# Patient Record
Sex: Male | Born: 1935
Health system: Southern US, Community
[De-identification: ages and names within clinical notes are randomized; demographics above are authoritative.]

## PROBLEM LIST (undated history)

## (undated) DIAGNOSIS — I10 Essential (primary) hypertension: Secondary | ICD-10-CM

## (undated) DIAGNOSIS — R7309 Other abnormal glucose: Secondary | ICD-10-CM

## (undated) DIAGNOSIS — Z87898 Personal history of other specified conditions: Secondary | ICD-10-CM

## (undated) DIAGNOSIS — K5730 Diverticulosis of large intestine without perforation or abscess without bleeding: Secondary | ICD-10-CM

## (undated) DIAGNOSIS — H9190 Unspecified hearing loss, unspecified ear: Secondary | ICD-10-CM

## (undated) DIAGNOSIS — K648 Other hemorrhoids: Secondary | ICD-10-CM

## (undated) DIAGNOSIS — K219 Gastro-esophageal reflux disease without esophagitis: Secondary | ICD-10-CM

## (undated) DIAGNOSIS — C679 Malignant neoplasm of bladder, unspecified: Secondary | ICD-10-CM

## (undated) DIAGNOSIS — M4800 Spinal stenosis, site unspecified: Secondary | ICD-10-CM

## (undated) DIAGNOSIS — R972 Elevated prostate specific antigen [PSA]: Secondary | ICD-10-CM

## (undated) DIAGNOSIS — D126 Benign neoplasm of colon, unspecified: Secondary | ICD-10-CM

## (undated) DIAGNOSIS — K4090 Unilateral inguinal hernia, without obstruction or gangrene, not specified as recurrent: Secondary | ICD-10-CM

## (undated) DIAGNOSIS — I739 Peripheral vascular disease, unspecified: Secondary | ICD-10-CM

## (undated) HISTORY — PX: LAPAROSCOPY: SHX197

## (undated) HISTORY — DX: Other hemorrhoids: K64.8

## (undated) HISTORY — DX: Elevated prostate specific antigen (PSA): R97.20

## (undated) HISTORY — DX: Peripheral vascular disease, unspecified: I73.9

## (undated) HISTORY — DX: Essential (primary) hypertension: I10

## (undated) HISTORY — PX: TONSILLECTOMY: SUR1361

## (undated) HISTORY — PX: PILONIDAL CYST / SINUS EXCISION: SUR543

## (undated) HISTORY — DX: Benign neoplasm of colon, unspecified: D12.6

## (undated) HISTORY — DX: Unspecified hearing loss, unspecified ear: H91.90

## (undated) HISTORY — DX: Diverticulosis of large intestine without perforation or abscess without bleeding: K57.30

## (undated) HISTORY — DX: Malignant neoplasm of bladder, unspecified: C67.9

## (undated) HISTORY — DX: Gastro-esophageal reflux disease without esophagitis: K21.9

## (undated) HISTORY — DX: Spinal stenosis, site unspecified: M48.00

## (undated) HISTORY — DX: Unilateral inguinal hernia, without obstruction or gangrene, not specified as recurrent: K40.90

## (undated) HISTORY — DX: Personal history of other specified conditions: Z87.898

## (undated) HISTORY — PX: CATARACT EXTRACTION: SUR2

## (undated) HISTORY — DX: Other abnormal glucose: R73.09

---

## 1998-09-23 ENCOUNTER — Encounter: Payer: Self-pay | Admitting: Urology

## 1998-09-23 ENCOUNTER — Ambulatory Visit (HOSPITAL_COMMUNITY): Admission: RE | Admit: 1998-09-23 | Discharge: 1998-09-23 | Payer: Self-pay | Admitting: Urology

## 1999-12-04 ENCOUNTER — Encounter: Payer: Self-pay | Admitting: Neurosurgery

## 1999-12-04 ENCOUNTER — Ambulatory Visit (HOSPITAL_COMMUNITY): Admission: RE | Admit: 1999-12-04 | Discharge: 1999-12-04 | Payer: Self-pay | Admitting: Neurosurgery

## 1999-12-18 ENCOUNTER — Ambulatory Visit (HOSPITAL_COMMUNITY): Admission: RE | Admit: 1999-12-18 | Discharge: 1999-12-18 | Payer: Self-pay | Admitting: Neurosurgery

## 1999-12-18 ENCOUNTER — Encounter: Payer: Self-pay | Admitting: Neurosurgery

## 2000-01-01 ENCOUNTER — Ambulatory Visit (HOSPITAL_COMMUNITY): Admission: RE | Admit: 2000-01-01 | Discharge: 2000-01-01 | Payer: Self-pay | Admitting: Neurosurgery

## 2000-01-01 ENCOUNTER — Encounter: Payer: Self-pay | Admitting: Neurosurgery

## 2000-02-19 ENCOUNTER — Encounter: Admission: RE | Admit: 2000-02-19 | Discharge: 2000-03-17 | Payer: Self-pay | Admitting: *Deleted

## 2000-04-01 ENCOUNTER — Encounter: Payer: Self-pay | Admitting: Neurosurgery

## 2000-04-01 ENCOUNTER — Encounter: Admission: RE | Admit: 2000-04-01 | Discharge: 2000-04-01 | Payer: Self-pay | Admitting: Neurosurgery

## 2001-01-18 DIAGNOSIS — Z87898 Personal history of other specified conditions: Secondary | ICD-10-CM

## 2001-01-18 HISTORY — DX: Personal history of other specified conditions: Z87.898

## 2001-05-25 ENCOUNTER — Encounter: Payer: Self-pay | Admitting: Pulmonary Disease

## 2001-05-25 ENCOUNTER — Ambulatory Visit (HOSPITAL_COMMUNITY): Admission: RE | Admit: 2001-05-25 | Discharge: 2001-05-25 | Payer: Self-pay | Admitting: Pulmonary Disease

## 2001-05-31 ENCOUNTER — Encounter (INDEPENDENT_AMBULATORY_CARE_PROVIDER_SITE_OTHER): Payer: Self-pay | Admitting: Specialist

## 2001-05-31 ENCOUNTER — Other Ambulatory Visit: Admission: RE | Admit: 2001-05-31 | Discharge: 2001-05-31 | Payer: Self-pay | Admitting: Oncology

## 2001-07-10 ENCOUNTER — Ambulatory Visit: Admission: RE | Admit: 2001-07-10 | Discharge: 2001-10-08 | Payer: Self-pay | Admitting: Radiation Oncology

## 2003-11-27 ENCOUNTER — Ambulatory Visit: Payer: Self-pay | Admitting: Pulmonary Disease

## 2004-01-03 ENCOUNTER — Ambulatory Visit: Payer: Self-pay | Admitting: Oncology

## 2004-04-10 ENCOUNTER — Ambulatory Visit: Payer: Self-pay | Admitting: Pulmonary Disease

## 2004-06-16 ENCOUNTER — Ambulatory Visit: Payer: Self-pay | Admitting: Pulmonary Disease

## 2004-06-19 ENCOUNTER — Ambulatory Visit: Payer: Self-pay | Admitting: Pulmonary Disease

## 2004-06-24 ENCOUNTER — Ambulatory Visit: Payer: Self-pay | Admitting: Pulmonary Disease

## 2004-07-02 ENCOUNTER — Ambulatory Visit: Payer: Self-pay | Admitting: Oncology

## 2005-01-01 ENCOUNTER — Ambulatory Visit: Payer: Self-pay | Admitting: Oncology

## 2005-01-22 ENCOUNTER — Ambulatory Visit: Payer: Self-pay | Admitting: Pulmonary Disease

## 2005-06-18 ENCOUNTER — Ambulatory Visit: Payer: Self-pay | Admitting: Pulmonary Disease

## 2005-06-23 ENCOUNTER — Ambulatory Visit: Payer: Self-pay | Admitting: Pulmonary Disease

## 2005-06-25 ENCOUNTER — Ambulatory Visit: Payer: Self-pay | Admitting: Pulmonary Disease

## 2005-06-30 ENCOUNTER — Ambulatory Visit: Payer: Self-pay | Admitting: Oncology

## 2005-07-02 LAB — CBC WITH DIFFERENTIAL/PLATELET
BASO%: 0.3 % (ref 0.0–2.0)
EOS%: 5.7 % (ref 0.0–7.0)
HCT: 40.7 % (ref 38.7–49.9)
LYMPH%: 20.9 % (ref 14.0–48.0)
MCH: 29.3 pg (ref 28.0–33.4)
MCHC: 34 g/dL (ref 32.0–35.9)
MONO%: 7.8 % (ref 0.0–13.0)
NEUT%: 65.3 % (ref 40.0–75.0)
Platelets: 205 10*3/uL (ref 145–400)
RBC: 4.73 10*6/uL (ref 4.20–5.71)

## 2005-07-05 LAB — COMPREHENSIVE METABOLIC PANEL
ALT: 25 U/L (ref 0–40)
AST: 29 U/L (ref 0–37)
Alkaline Phosphatase: 44 U/L (ref 39–117)
Creatinine, Ser: 1.25 mg/dL (ref 0.40–1.50)
Sodium: 134 mEq/L — ABNORMAL LOW (ref 135–145)
Total Bilirubin: 1 mg/dL (ref 0.3–1.2)
Total Protein: 6.8 g/dL (ref 6.0–8.3)

## 2005-07-05 LAB — BETA 2 MICROGLOBULIN, SERUM: Beta-2 Microglobulin: 2.37 mg/L — ABNORMAL HIGH (ref 1.01–1.73)

## 2005-12-29 ENCOUNTER — Ambulatory Visit: Payer: Self-pay | Admitting: Oncology

## 2005-12-31 LAB — CBC WITH DIFFERENTIAL/PLATELET
Basophils Absolute: 0 10*3/uL (ref 0.0–0.1)
Eosinophils Absolute: 0.5 10*3/uL (ref 0.0–0.5)
HCT: 40.9 % (ref 38.7–49.9)
HGB: 14 g/dL (ref 13.0–17.1)
LYMPH%: 16.8 % (ref 14.0–48.0)
MCH: 29.7 pg (ref 28.0–33.4)
MCV: 86.7 fL (ref 81.6–98.0)
MONO%: 7.2 % (ref 0.0–13.0)
NEUT#: 5.6 10*3/uL (ref 1.5–6.5)
NEUT%: 69.4 % (ref 40.0–75.0)
Platelets: 219 10*3/uL (ref 145–400)

## 2006-01-03 LAB — COMPREHENSIVE METABOLIC PANEL
Albumin: 4.4 g/dL (ref 3.5–5.2)
Alkaline Phosphatase: 50 U/L (ref 39–117)
BUN: 18 mg/dL (ref 6–23)
Creatinine, Ser: 1.15 mg/dL (ref 0.40–1.50)
Glucose, Bld: 92 mg/dL (ref 70–99)
Potassium: 3.8 mEq/L (ref 3.5–5.3)
Total Bilirubin: 1.1 mg/dL (ref 0.3–1.2)

## 2006-06-14 ENCOUNTER — Ambulatory Visit: Payer: Self-pay | Admitting: Pulmonary Disease

## 2006-06-14 LAB — CONVERTED CEMR LAB
ALT: 22 units/L (ref 0–40)
Basophils Relative: 0.5 % (ref 0.0–1.0)
Bilirubin, Direct: 0.1 mg/dL (ref 0.0–0.3)
CO2: 27 meq/L (ref 19–32)
Calcium: 9.2 mg/dL (ref 8.4–10.5)
Cholesterol: 185 mg/dL (ref 0–200)
Eosinophils Relative: 5.3 % — ABNORMAL HIGH (ref 0.0–5.0)
GFR calc Af Amer: 68 mL/min
Glucose, Bld: 123 mg/dL — ABNORMAL HIGH (ref 70–99)
HCT: 42.3 % (ref 39.0–52.0)
Hemoglobin: 14.2 g/dL (ref 13.0–17.0)
Leukocytes, UA: NEGATIVE
Lymphocytes Relative: 13.7 % (ref 12.0–46.0)
Monocytes Absolute: 0.7 10*3/uL (ref 0.2–0.7)
Neutro Abs: 7.1 10*3/uL (ref 1.4–7.7)
Neutrophils Relative %: 73.2 % (ref 43.0–77.0)
Nitrite: NEGATIVE
Specific Gravity, Urine: 1.01 (ref 1.000–1.03)
Total Protein, Urine: NEGATIVE mg/dL
Total Protein: 6.8 g/dL (ref 6.0–8.3)
WBC: 9.6 10*3/uL (ref 4.5–10.5)

## 2006-06-17 ENCOUNTER — Ambulatory Visit: Payer: Self-pay | Admitting: Pulmonary Disease

## 2006-07-05 ENCOUNTER — Ambulatory Visit: Payer: Self-pay | Admitting: Oncology

## 2006-07-08 LAB — CBC WITH DIFFERENTIAL/PLATELET
BASO%: 0.6 % (ref 0.0–2.0)
EOS%: 2.9 % (ref 0.0–7.0)
Eosinophils Absolute: 0.3 10*3/uL (ref 0.0–0.5)
MCV: 85.9 fL (ref 81.6–98.0)
MONO%: 5.9 % (ref 0.0–13.0)
NEUT#: 8.2 10*3/uL — ABNORMAL HIGH (ref 1.5–6.5)
RBC: 4.66 10*6/uL (ref 4.20–5.71)
RDW: 13.1 % (ref 11.2–14.6)

## 2006-07-08 LAB — COMPREHENSIVE METABOLIC PANEL
ALT: 18 U/L (ref 0–53)
AST: 24 U/L (ref 0–37)
Albumin: 4.2 g/dL (ref 3.5–5.2)
Alkaline Phosphatase: 52 U/L (ref 39–117)
Glucose, Bld: 81 mg/dL (ref 70–99)
Potassium: 3.8 mEq/L (ref 3.5–5.3)
Sodium: 138 mEq/L (ref 135–145)
Total Protein: 7.1 g/dL (ref 6.0–8.3)

## 2006-09-08 ENCOUNTER — Ambulatory Visit: Payer: Self-pay | Admitting: Oncology

## 2006-10-14 ENCOUNTER — Ambulatory Visit: Payer: Self-pay | Admitting: Gastroenterology

## 2006-11-09 ENCOUNTER — Ambulatory Visit: Payer: Self-pay | Admitting: Oncology

## 2006-11-18 ENCOUNTER — Encounter: Payer: Self-pay | Admitting: Gastroenterology

## 2006-11-18 ENCOUNTER — Ambulatory Visit: Payer: Self-pay | Admitting: Gastroenterology

## 2006-11-18 DIAGNOSIS — D126 Benign neoplasm of colon, unspecified: Secondary | ICD-10-CM

## 2006-11-18 DIAGNOSIS — K5730 Diverticulosis of large intestine without perforation or abscess without bleeding: Secondary | ICD-10-CM | POA: Insufficient documentation

## 2006-11-18 DIAGNOSIS — K648 Other hemorrhoids: Secondary | ICD-10-CM | POA: Insufficient documentation

## 2006-11-21 DIAGNOSIS — M545 Low back pain: Secondary | ICD-10-CM | POA: Insufficient documentation

## 2006-11-21 DIAGNOSIS — R7309 Other abnormal glucose: Secondary | ICD-10-CM | POA: Insufficient documentation

## 2006-11-21 DIAGNOSIS — K219 Gastro-esophageal reflux disease without esophagitis: Secondary | ICD-10-CM | POA: Insufficient documentation

## 2006-11-21 DIAGNOSIS — N400 Enlarged prostate without lower urinary tract symptoms: Secondary | ICD-10-CM | POA: Insufficient documentation

## 2006-11-21 DIAGNOSIS — I10 Essential (primary) hypertension: Secondary | ICD-10-CM | POA: Insufficient documentation

## 2007-01-04 ENCOUNTER — Ambulatory Visit: Payer: Self-pay | Admitting: Oncology

## 2007-01-06 ENCOUNTER — Encounter: Payer: Self-pay | Admitting: Pulmonary Disease

## 2007-01-06 LAB — CBC WITH DIFFERENTIAL/PLATELET
BASO%: 0.3 % (ref 0.0–2.0)
EOS%: 4.2 % (ref 0.0–7.0)
HCT: 38.8 % (ref 38.7–49.9)
LYMPH%: 18.4 % (ref 14.0–48.0)
MCH: 30 pg (ref 28.0–33.4)
MCHC: 34.7 g/dL (ref 32.0–35.9)
MCV: 86.6 fL (ref 81.6–98.0)
NEUT%: 68.9 % (ref 40.0–75.0)
Platelets: 215 10*3/uL (ref 145–400)

## 2007-01-09 LAB — COMPREHENSIVE METABOLIC PANEL
AST: 23 U/L (ref 0–37)
Albumin: 4.1 g/dL (ref 3.5–5.2)
Alkaline Phosphatase: 46 U/L (ref 39–117)
BUN: 12 mg/dL (ref 6–23)
Potassium: 3.9 mEq/L (ref 3.5–5.3)
Total Bilirubin: 1 mg/dL (ref 0.3–1.2)

## 2007-03-27 ENCOUNTER — Ambulatory Visit: Payer: Self-pay | Admitting: Pulmonary Disease

## 2007-03-31 ENCOUNTER — Encounter: Payer: Self-pay | Admitting: Pulmonary Disease

## 2007-04-03 DIAGNOSIS — M4800 Spinal stenosis, site unspecified: Secondary | ICD-10-CM

## 2007-04-03 DIAGNOSIS — Z87898 Personal history of other specified conditions: Secondary | ICD-10-CM

## 2007-07-05 ENCOUNTER — Ambulatory Visit: Payer: Self-pay | Admitting: Oncology

## 2007-07-07 ENCOUNTER — Encounter: Payer: Self-pay | Admitting: Pulmonary Disease

## 2007-07-07 LAB — CBC WITH DIFFERENTIAL/PLATELET
Basophils Absolute: 0 10*3/uL (ref 0.0–0.1)
Eosinophils Absolute: 0.6 10*3/uL — ABNORMAL HIGH (ref 0.0–0.5)
HGB: 13.7 g/dL (ref 13.0–17.1)
MONO#: 0.5 10*3/uL (ref 0.1–0.9)
NEUT#: 5.6 10*3/uL (ref 1.5–6.5)
RBC: 4.55 10*6/uL (ref 4.20–5.71)
RDW: 12.9 % (ref 11.2–14.6)
WBC: 8.2 10*3/uL (ref 4.0–10.0)
lymph#: 1.4 10*3/uL (ref 0.9–3.3)

## 2007-07-10 ENCOUNTER — Ambulatory Visit: Payer: Self-pay | Admitting: Pulmonary Disease

## 2007-07-10 LAB — COMPREHENSIVE METABOLIC PANEL
ALT: 17 U/L (ref 0–53)
AST: 24 U/L (ref 0–37)
Creatinine, Ser: 1.22 mg/dL (ref 0.40–1.50)
Sodium: 133 mEq/L — ABNORMAL LOW (ref 135–145)
Total Bilirubin: 0.9 mg/dL (ref 0.3–1.2)
Total Protein: 6.9 g/dL (ref 6.0–8.3)

## 2007-07-11 ENCOUNTER — Ambulatory Visit: Payer: Self-pay | Admitting: Pulmonary Disease

## 2007-07-11 LAB — CONVERTED CEMR LAB: OCCULT 1: NEGATIVE

## 2007-07-13 ENCOUNTER — Encounter: Payer: Self-pay | Admitting: Pulmonary Disease

## 2007-07-14 ENCOUNTER — Ambulatory Visit: Payer: Self-pay | Admitting: Pulmonary Disease

## 2007-07-14 DIAGNOSIS — C679 Malignant neoplasm of bladder, unspecified: Secondary | ICD-10-CM | POA: Insufficient documentation

## 2007-07-14 DIAGNOSIS — R972 Elevated prostate specific antigen [PSA]: Secondary | ICD-10-CM | POA: Insufficient documentation

## 2007-07-14 LAB — CONVERTED CEMR LAB
ALT: 21 units/L (ref 0–53)
BUN: 17 mg/dL (ref 6–23)
Basophils Relative: 0.1 % (ref 0.0–1.0)
CO2: 28 meq/L (ref 19–32)
Calcium: 9 mg/dL (ref 8.4–10.5)
Cholesterol: 173 mg/dL (ref 0–200)
Creatinine, Ser: 1.2 mg/dL (ref 0.4–1.5)
Eosinophils Relative: 5.6 % — ABNORMAL HIGH (ref 0.0–5.0)
Glucose, Bld: 115 mg/dL — ABNORMAL HIGH (ref 70–99)
Hemoglobin: 12.8 g/dL — ABNORMAL LOW (ref 13.0–17.0)
LDL Cholesterol: 87 mg/dL (ref 0–99)
Lymphocytes Relative: 14.4 % (ref 12.0–46.0)
MCHC: 33.9 g/dL (ref 30.0–36.0)
Monocytes Relative: 6.7 % (ref 3.0–12.0)
Neutro Abs: 6.3 10*3/uL (ref 1.4–7.7)
RBC: 4.19 M/uL — ABNORMAL LOW (ref 4.22–5.81)
TSH: 0.9 microintl units/mL (ref 0.35–5.50)
Total CHOL/HDL Ratio: 2.3
Total Protein: 6.5 g/dL (ref 6.0–8.3)

## 2007-08-14 ENCOUNTER — Encounter: Payer: Self-pay | Admitting: Pulmonary Disease

## 2007-09-29 ENCOUNTER — Encounter: Payer: Self-pay | Admitting: Pulmonary Disease

## 2007-10-12 ENCOUNTER — Encounter: Payer: Self-pay | Admitting: Pulmonary Disease

## 2007-10-26 ENCOUNTER — Encounter: Payer: Self-pay | Admitting: Pulmonary Disease

## 2007-11-08 ENCOUNTER — Ambulatory Visit: Payer: Self-pay | Admitting: Pulmonary Disease

## 2007-12-19 ENCOUNTER — Ambulatory Visit: Payer: Self-pay | Admitting: Pulmonary Disease

## 2007-12-27 ENCOUNTER — Ambulatory Visit: Payer: Self-pay | Admitting: Oncology

## 2007-12-29 ENCOUNTER — Encounter: Payer: Self-pay | Admitting: Pulmonary Disease

## 2007-12-29 LAB — CBC WITH DIFFERENTIAL/PLATELET
BASO%: 0.2 % (ref 0.0–2.0)
Basophils Absolute: 0 10*3/uL (ref 0.0–0.1)
HCT: 39.6 % (ref 38.7–49.9)
HGB: 13.6 g/dL (ref 13.0–17.1)
LYMPH%: 16.7 % (ref 14.0–48.0)
MCHC: 34.2 g/dL (ref 32.0–35.9)
MONO#: 0.4 10*3/uL (ref 0.1–0.9)
NEUT%: 73.6 % (ref 40.0–75.0)
Platelets: 205 10*3/uL (ref 145–400)
WBC: 6.6 10*3/uL (ref 4.0–10.0)

## 2008-01-01 LAB — COMPREHENSIVE METABOLIC PANEL
ALT: 18 U/L (ref 0–53)
BUN: 16 mg/dL (ref 6–23)
CO2: 25 mEq/L (ref 19–32)
Calcium: 9.5 mg/dL (ref 8.4–10.5)
Creatinine, Ser: 1.16 mg/dL (ref 0.40–1.50)
Glucose, Bld: 89 mg/dL (ref 70–99)
Total Bilirubin: 0.9 mg/dL (ref 0.3–1.2)

## 2008-01-01 LAB — LACTATE DEHYDROGENASE: LDH: 186 U/L (ref 94–250)

## 2008-01-01 LAB — BETA 2 MICROGLOBULIN, SERUM: Beta-2 Microglobulin: 2.35 mg/L — ABNORMAL HIGH (ref 1.01–1.73)

## 2008-01-08 ENCOUNTER — Telehealth: Payer: Self-pay | Admitting: Gastroenterology

## 2008-01-09 ENCOUNTER — Telehealth: Payer: Self-pay | Admitting: Pulmonary Disease

## 2008-01-09 ENCOUNTER — Ambulatory Visit: Payer: Self-pay | Admitting: Pulmonary Disease

## 2008-01-09 ENCOUNTER — Ambulatory Visit (HOSPITAL_COMMUNITY): Admission: RE | Admit: 2008-01-09 | Discharge: 2008-01-09 | Payer: Self-pay | Admitting: Pulmonary Disease

## 2008-01-09 DIAGNOSIS — K4090 Unilateral inguinal hernia, without obstruction or gangrene, not specified as recurrent: Secondary | ICD-10-CM | POA: Insufficient documentation

## 2008-01-09 LAB — CONVERTED CEMR LAB
ALT: 21 units/L (ref 0–53)
Albumin: 4 g/dL (ref 3.5–5.2)
BUN: 23 mg/dL (ref 6–23)
Basophils Absolute: 0 10*3/uL (ref 0.0–0.1)
Basophils Relative: 0.2 % (ref 0.0–3.0)
CO2: 29 meq/L (ref 19–32)
Calcium: 9.8 mg/dL (ref 8.4–10.5)
Creatinine, Ser: 1.3 mg/dL (ref 0.4–1.5)
Eosinophils Absolute: 0.1 10*3/uL (ref 0.0–0.7)
Eosinophils Relative: 1.1 % (ref 0.0–5.0)
Hemoglobin: 14.3 g/dL (ref 13.0–17.0)
Lymphocytes Relative: 11.3 % — ABNORMAL LOW (ref 12.0–46.0)
MCHC: 34.8 g/dL (ref 30.0–36.0)
MCV: 89 fL (ref 78.0–100.0)
Neutro Abs: 7.9 10*3/uL — ABNORMAL HIGH (ref 1.4–7.7)
RBC: 4.6 M/uL (ref 4.22–5.81)
Sed Rate: 14 mm/hr (ref 0–16)
Total Bilirubin: 1.3 mg/dL — ABNORMAL HIGH (ref 0.3–1.2)
Total Protein: 7 g/dL (ref 6.0–8.3)

## 2008-01-10 ENCOUNTER — Ambulatory Visit: Payer: Self-pay | Admitting: Gastroenterology

## 2008-01-10 ENCOUNTER — Telehealth: Payer: Self-pay | Admitting: Gastroenterology

## 2008-01-11 ENCOUNTER — Telehealth: Payer: Self-pay | Admitting: Gastroenterology

## 2008-01-15 ENCOUNTER — Telehealth: Payer: Self-pay | Admitting: Gastroenterology

## 2008-01-15 ENCOUNTER — Encounter: Payer: Self-pay | Admitting: Gastroenterology

## 2008-01-16 ENCOUNTER — Ambulatory Visit (HOSPITAL_COMMUNITY): Admission: RE | Admit: 2008-01-16 | Discharge: 2008-01-16 | Payer: Self-pay | Admitting: Gastroenterology

## 2008-01-16 ENCOUNTER — Encounter: Payer: Self-pay | Admitting: Gastroenterology

## 2008-01-16 ENCOUNTER — Ambulatory Visit: Payer: Self-pay | Admitting: Gastroenterology

## 2008-01-18 ENCOUNTER — Telehealth: Payer: Self-pay | Admitting: Gastroenterology

## 2008-01-22 ENCOUNTER — Ambulatory Visit: Payer: Self-pay | Admitting: Internal Medicine

## 2008-01-22 ENCOUNTER — Telehealth: Payer: Self-pay | Admitting: Gastroenterology

## 2008-01-22 DIAGNOSIS — K5000 Crohn's disease of small intestine without complications: Secondary | ICD-10-CM | POA: Insufficient documentation

## 2008-01-23 ENCOUNTER — Telehealth: Payer: Self-pay | Admitting: Physician Assistant

## 2008-01-23 ENCOUNTER — Ambulatory Visit: Payer: Self-pay | Admitting: Internal Medicine

## 2008-01-24 ENCOUNTER — Ambulatory Visit: Payer: Self-pay | Admitting: Gastroenterology

## 2008-01-24 LAB — CONVERTED CEMR LAB
BUN: 19 mg/dL (ref 6–23)
CO2: 27 meq/L (ref 19–32)
Chloride: 96 meq/L (ref 96–112)
Eosinophils Relative: 0.9 % (ref 0.0–5.0)
Glucose, Bld: 120 mg/dL — ABNORMAL HIGH (ref 70–99)
HCT: 41.2 % (ref 39.0–52.0)
Lymphocytes Relative: 13.1 % (ref 12.0–46.0)
Monocytes Relative: 6.3 % (ref 3.0–12.0)
Neutrophils Relative %: 79.5 % — ABNORMAL HIGH (ref 43.0–77.0)
Platelets: 216 10*3/uL (ref 150–400)
Potassium: 3.3 meq/L — ABNORMAL LOW (ref 3.5–5.1)
RDW: 12.1 % (ref 11.5–14.6)
Sodium: 131 meq/L — ABNORMAL LOW (ref 135–145)
WBC: 12.1 10*3/uL — ABNORMAL HIGH (ref 4.5–10.5)

## 2008-01-26 ENCOUNTER — Telehealth: Payer: Self-pay | Admitting: Physician Assistant

## 2008-01-26 ENCOUNTER — Encounter: Payer: Self-pay | Admitting: Gastroenterology

## 2008-01-30 ENCOUNTER — Ambulatory Visit: Payer: Self-pay | Admitting: Gastroenterology

## 2008-01-30 ENCOUNTER — Telehealth: Payer: Self-pay | Admitting: Physician Assistant

## 2008-01-30 ENCOUNTER — Telehealth: Payer: Self-pay | Admitting: Gastroenterology

## 2008-01-30 DIAGNOSIS — K5900 Constipation, unspecified: Secondary | ICD-10-CM | POA: Insufficient documentation

## 2008-01-31 ENCOUNTER — Telehealth: Payer: Self-pay | Admitting: Gastroenterology

## 2008-01-31 ENCOUNTER — Telehealth: Payer: Self-pay | Admitting: Pulmonary Disease

## 2008-02-01 ENCOUNTER — Telehealth: Payer: Self-pay | Admitting: Gastroenterology

## 2008-02-06 ENCOUNTER — Telehealth: Payer: Self-pay | Admitting: Gastroenterology

## 2008-02-07 ENCOUNTER — Telehealth: Payer: Self-pay | Admitting: Gastroenterology

## 2008-02-12 ENCOUNTER — Telehealth: Payer: Self-pay | Admitting: Gastroenterology

## 2008-02-16 ENCOUNTER — Encounter (INDEPENDENT_AMBULATORY_CARE_PROVIDER_SITE_OTHER): Payer: Self-pay | Admitting: General Surgery

## 2008-02-16 ENCOUNTER — Ambulatory Visit (HOSPITAL_COMMUNITY): Admission: RE | Admit: 2008-02-16 | Discharge: 2008-02-18 | Payer: Self-pay | Admitting: General Surgery

## 2008-02-19 ENCOUNTER — Encounter: Payer: Self-pay | Admitting: Gastroenterology

## 2008-02-19 ENCOUNTER — Telehealth: Payer: Self-pay | Admitting: Gastroenterology

## 2008-02-20 ENCOUNTER — Telehealth: Payer: Self-pay | Admitting: Pulmonary Disease

## 2008-02-26 ENCOUNTER — Encounter: Payer: Self-pay | Admitting: Gastroenterology

## 2008-02-26 ENCOUNTER — Telehealth (INDEPENDENT_AMBULATORY_CARE_PROVIDER_SITE_OTHER): Payer: Self-pay | Admitting: *Deleted

## 2008-03-01 ENCOUNTER — Telehealth: Payer: Self-pay | Admitting: Pulmonary Disease

## 2008-03-04 ENCOUNTER — Ambulatory Visit: Payer: Self-pay | Admitting: Pulmonary Disease

## 2008-03-06 ENCOUNTER — Ambulatory Visit: Payer: Self-pay | Admitting: Pulmonary Disease

## 2008-03-07 LAB — CONVERTED CEMR LAB
ALT: 45 units/L (ref 0–53)
AST: 37 units/L (ref 0–37)
Albumin: 3.2 g/dL — ABNORMAL LOW (ref 3.5–5.2)
BUN: 17 mg/dL (ref 6–23)
Basophils Absolute: 0 10*3/uL (ref 0.0–0.1)
Basophils Relative: 0.6 % (ref 0.0–3.0)
CO2: 29 meq/L (ref 19–32)
Calcium: 9.7 mg/dL (ref 8.4–10.5)
Chloride: 101 meq/L (ref 96–112)
Creatinine, Ser: 1 mg/dL (ref 0.4–1.5)
Eosinophils Absolute: 0.1 10*3/uL (ref 0.0–0.7)
Eosinophils Relative: 1.6 % (ref 0.0–5.0)
GFR calc non Af Amer: 76 mL/min
Hemoglobin: 12.3 g/dL — ABNORMAL LOW (ref 13.0–17.0)
MCHC: 34 g/dL (ref 30.0–36.0)
MCV: 89.7 fL (ref 78.0–100.0)
Neutro Abs: 4.9 10*3/uL (ref 1.4–7.7)
Neutrophils Relative %: 62.2 % (ref 43.0–77.0)
RBC: 4.03 M/uL — ABNORMAL LOW (ref 4.22–5.81)
Total Bilirubin: 0.8 mg/dL (ref 0.3–1.2)
WBC: 7.8 10*3/uL (ref 4.5–10.5)

## 2008-03-29 ENCOUNTER — Encounter: Payer: Self-pay | Admitting: Pulmonary Disease

## 2008-04-01 ENCOUNTER — Encounter: Payer: Self-pay | Admitting: Gastroenterology

## 2008-06-04 ENCOUNTER — Encounter: Payer: Self-pay | Admitting: Pulmonary Disease

## 2008-06-20 ENCOUNTER — Telehealth (INDEPENDENT_AMBULATORY_CARE_PROVIDER_SITE_OTHER): Payer: Self-pay | Admitting: *Deleted

## 2008-06-26 ENCOUNTER — Ambulatory Visit: Payer: Self-pay | Admitting: Oncology

## 2008-06-28 ENCOUNTER — Telehealth: Payer: Self-pay | Admitting: Pulmonary Disease

## 2008-06-28 ENCOUNTER — Encounter: Payer: Self-pay | Admitting: Pulmonary Disease

## 2008-06-28 LAB — CBC WITH DIFFERENTIAL/PLATELET
BASO%: 0.4 % (ref 0.0–2.0)
Eosinophils Absolute: 0.5 10*3/uL (ref 0.0–0.5)
HCT: 38.5 % (ref 38.4–49.9)
LYMPH%: 18.5 % (ref 14.0–49.0)
MCHC: 35.3 g/dL (ref 32.0–36.0)
MONO#: 0.5 10*3/uL (ref 0.1–0.9)
NEUT#: 4.5 10*3/uL (ref 1.5–6.5)
Platelets: 173 10*3/uL (ref 140–400)
RBC: 4.41 10*6/uL (ref 4.20–5.82)
WBC: 6.8 10*3/uL (ref 4.0–10.3)
lymph#: 1.2 10*3/uL (ref 0.9–3.3)

## 2008-07-01 LAB — COMPREHENSIVE METABOLIC PANEL
ALT: 18 U/L (ref 0–53)
Albumin: 4 g/dL (ref 3.5–5.2)
CO2: 25 mEq/L (ref 19–32)
Calcium: 9.1 mg/dL (ref 8.4–10.5)
Chloride: 101 mEq/L (ref 96–112)
Glucose, Bld: 91 mg/dL (ref 70–99)
Sodium: 137 mEq/L (ref 135–145)
Total Bilirubin: 0.9 mg/dL (ref 0.3–1.2)
Total Protein: 6.7 g/dL (ref 6.0–8.3)

## 2008-07-01 LAB — LACTATE DEHYDROGENASE: LDH: 189 U/L (ref 94–250)

## 2008-07-02 ENCOUNTER — Ambulatory Visit: Payer: Self-pay | Admitting: Pulmonary Disease

## 2008-07-05 ENCOUNTER — Ambulatory Visit: Payer: Self-pay | Admitting: Pulmonary Disease

## 2008-07-05 DIAGNOSIS — I739 Peripheral vascular disease, unspecified: Secondary | ICD-10-CM

## 2008-07-05 LAB — CONVERTED CEMR LAB
ALT: 19 units/L (ref 0–53)
AST: 28 units/L (ref 0–37)
Albumin: 3.8 g/dL (ref 3.5–5.2)
BUN: 22 mg/dL (ref 6–23)
CO2: 31 meq/L (ref 19–32)
Chloride: 107 meq/L (ref 96–112)
Cholesterol: 179 mg/dL (ref 0–200)
Creatinine, Ser: 1.3 mg/dL (ref 0.4–1.5)
Eosinophils Relative: 6.4 % — ABNORMAL HIGH (ref 0.0–5.0)
Glucose, Bld: 109 mg/dL — ABNORMAL HIGH (ref 70–99)
Lymphocytes Relative: 13.5 % (ref 12.0–46.0)
MCV: 88.4 fL (ref 78.0–100.0)
Monocytes Absolute: 0.6 10*3/uL (ref 0.1–1.0)
Monocytes Relative: 8 % (ref 3.0–12.0)
Neutrophils Relative %: 72 % (ref 43.0–77.0)
PSA: 3.21 ng/mL (ref 0.10–4.00)
Platelets: 150 10*3/uL (ref 150.0–400.0)
Potassium: 3.8 meq/L (ref 3.5–5.1)
RBC: 4.39 M/uL (ref 4.22–5.81)
TSH: 0.79 microintl units/mL (ref 0.35–5.50)
Total Protein: 6.6 g/dL (ref 6.0–8.3)
Triglycerides: 50 mg/dL (ref 0.0–149.0)
WBC: 7 10*3/uL (ref 4.5–10.5)

## 2008-08-08 ENCOUNTER — Ambulatory Visit: Payer: Self-pay | Admitting: Pulmonary Disease

## 2008-08-09 DIAGNOSIS — R229 Localized swelling, mass and lump, unspecified: Secondary | ICD-10-CM

## 2008-08-30 ENCOUNTER — Telehealth: Payer: Self-pay | Admitting: Adult Health

## 2008-09-05 ENCOUNTER — Telehealth: Payer: Self-pay | Admitting: Adult Health

## 2008-09-20 ENCOUNTER — Encounter: Payer: Self-pay | Admitting: Pulmonary Disease

## 2008-10-25 ENCOUNTER — Encounter: Payer: Self-pay | Admitting: Pulmonary Disease

## 2008-12-25 ENCOUNTER — Ambulatory Visit: Payer: Self-pay | Admitting: Oncology

## 2008-12-27 ENCOUNTER — Encounter: Payer: Self-pay | Admitting: Pulmonary Disease

## 2008-12-27 LAB — CBC WITH DIFFERENTIAL/PLATELET
Basophils Absolute: 0 10*3/uL (ref 0.0–0.1)
EOS%: 6.3 % (ref 0.0–7.0)
HCT: 41.6 % (ref 38.4–49.9)
HGB: 14.1 g/dL (ref 13.0–17.1)
LYMPH%: 24.5 % (ref 14.0–49.0)
MCH: 30.3 pg (ref 27.2–33.4)
MCV: 89.6 fL (ref 79.3–98.0)
MONO%: 7.5 % (ref 0.0–14.0)
NEUT%: 61.3 % (ref 39.0–75.0)
Platelets: 180 10*3/uL (ref 140–400)

## 2008-12-30 LAB — COMPREHENSIVE METABOLIC PANEL
Albumin: 4.4 g/dL (ref 3.5–5.2)
Alkaline Phosphatase: 46 U/L (ref 39–117)
BUN: 19 mg/dL (ref 6–23)
CO2: 26 mEq/L (ref 19–32)
Glucose, Bld: 70 mg/dL (ref 70–99)
Total Bilirubin: 0.8 mg/dL (ref 0.3–1.2)

## 2008-12-30 LAB — BETA 2 MICROGLOBULIN, SERUM: Beta-2 Microglobulin: 2.23 mg/L — ABNORMAL HIGH (ref 1.01–1.73)

## 2009-02-21 ENCOUNTER — Telehealth: Payer: Self-pay | Admitting: Pulmonary Disease

## 2009-03-24 ENCOUNTER — Telehealth (INDEPENDENT_AMBULATORY_CARE_PROVIDER_SITE_OTHER): Payer: Self-pay | Admitting: *Deleted

## 2009-03-24 ENCOUNTER — Ambulatory Visit: Payer: Self-pay | Admitting: Pulmonary Disease

## 2009-03-26 ENCOUNTER — Telehealth (INDEPENDENT_AMBULATORY_CARE_PROVIDER_SITE_OTHER): Payer: Self-pay | Admitting: *Deleted

## 2009-04-25 ENCOUNTER — Encounter: Payer: Self-pay | Admitting: Pulmonary Disease

## 2009-06-25 ENCOUNTER — Ambulatory Visit: Payer: Self-pay | Admitting: Oncology

## 2009-06-27 ENCOUNTER — Encounter: Payer: Self-pay | Admitting: Pulmonary Disease

## 2009-07-02 ENCOUNTER — Telehealth (INDEPENDENT_AMBULATORY_CARE_PROVIDER_SITE_OTHER): Payer: Self-pay | Admitting: *Deleted

## 2009-07-08 ENCOUNTER — Ambulatory Visit: Payer: Self-pay | Admitting: Pulmonary Disease

## 2009-07-11 ENCOUNTER — Ambulatory Visit: Payer: Self-pay | Admitting: Pulmonary Disease

## 2009-07-11 LAB — CONVERTED CEMR LAB
AST: 30 units/L (ref 0–37)
Albumin: 4.1 g/dL (ref 3.5–5.2)
Alkaline Phosphatase: 42 units/L (ref 39–117)
BUN: 22 mg/dL (ref 6–23)
Basophils Absolute: 0 10*3/uL (ref 0.0–0.1)
CO2: 28 meq/L (ref 19–32)
Calcium: 9.1 mg/dL (ref 8.4–10.5)
Cholesterol: 186 mg/dL (ref 0–200)
Glucose, Bld: 107 mg/dL — ABNORMAL HIGH (ref 70–99)
HDL: 77.7 mg/dL (ref >39.00)
Hemoglobin: 13.5 g/dL (ref 13.0–17.0)
Lymphocytes Relative: 20.9 % (ref 12.0–46.0)
Monocytes Relative: 7.8 % (ref 3.0–12.0)
Platelets: 180 10*3/uL (ref 150.0–400.0)
RDW: 12.7 % (ref 11.5–14.6)
Sodium: 137 meq/L (ref 135–145)
TSH: 1.19 microintl units/mL (ref 0.35–5.50)
Total Protein: 6.8 g/dL (ref 6.0–8.3)

## 2009-07-23 ENCOUNTER — Encounter: Payer: Self-pay | Admitting: Pulmonary Disease

## 2009-10-14 ENCOUNTER — Encounter (INDEPENDENT_AMBULATORY_CARE_PROVIDER_SITE_OTHER): Payer: Self-pay | Admitting: *Deleted

## 2009-10-16 ENCOUNTER — Telehealth: Payer: Self-pay | Admitting: Gastroenterology

## 2009-11-07 ENCOUNTER — Encounter: Payer: Self-pay | Admitting: Pulmonary Disease

## 2009-12-24 ENCOUNTER — Ambulatory Visit: Payer: Self-pay | Admitting: Oncology

## 2009-12-26 ENCOUNTER — Encounter: Payer: Self-pay | Admitting: Pulmonary Disease

## 2010-02-06 ENCOUNTER — Other Ambulatory Visit: Payer: Self-pay | Admitting: Dermatology

## 2010-02-19 NOTE — Letter (Signed)
Summary: [facility]   Imported By: Sherian Rein 08/20/2009 14:39:06  _____________________________________________________________________  External Attachment:    Type:   Image     Comment:   External Document

## 2010-02-19 NOTE — Progress Notes (Signed)
Summary: appt  Phone Note Call from Patient Call back at Home Phone [phone]   Caller: Spouse-Zelda Call For: nadel Reason for Call: Talk to Nurse Summary of Call: up coughing all night.  Feels he should be seen, TP said pt has bronchitis.  Please respond Initial call taken by: Eugene Gavia,  March 26, 2009 9:03 AM  Follow-up for Phone Call        The patients spouse says patient was up all night coughing but did not take the cough syrup. He is taking the Zpak and Mucinex DM as RX'd. Spouse says the cough is no worse and he did not continue Hydromet due to it made him too drowsy and was groggy upon awakening. He only took this 1 time. Please advise of any other recs for this patient.Michel Bickers [facility]  March 26, 2009 9:26 AM  Additional Follow-up for Phone Call Additional follow up Details #1::        can cut dose in 1/2 and take earlier in night to avoid daytime sleepiness. use sugarless candy,  can have prednisone 10mg  #20 4 tabs for 2 days, then 3 tabs for 2 days, 2 tabs for 2 days, then 1 tab for 2 days, then stop no refills wife is aware   Additional Follow-up by: Rubye Oaks NP,  March 26, 2009 9:38 AM    Additional Follow-up for Phone Call Additional follow up Details #2::    rx sent to pharmacy. Boone Master CNA  March 26, 2009 10:47 AM   New/Updated Medications: PREDNISONE 10 MG TABS (PREDNISONE) 4 tabs for 2 days, then 3 tabs for 2 days, 2 tabs for 2 days, then 1 tab for 2 days, then stop Prescriptions: PREDNISONE 10 MG TABS (PREDNISONE) 4 tabs for 2 days, then 3 tabs for 2 days, 2 tabs for 2 days, then 1 tab for 2 days, then stop  #20 x 0   Entered by:   Boone Master CNA   Authorized by:   Rubye Oaks NP   Signed by:   Boone Master CNA on 03/26/2009   Method used:   Electronically to        [facility]* (retail)       [address]       [place], [place]  [id number]       Ph: [phone]       Fax: [phone]   RxID:    [id number]

## 2010-02-19 NOTE — Letter (Signed)
[facility]   Imported By: Lennie Odor 07/23/2009 16:14:55  _____________________________________________________________________  External Attachment:    Type:   Image     Comment:   External Document

## 2010-02-19 NOTE — Letter (Signed)
Summary: [facility]  [facility]   Imported By: Sherian Rein 01/14/2010 07:08:06  _____________________________________________________________________  External Attachment:    Type:   Image     Comment:   External Document

## 2010-02-19 NOTE — Assessment & Plan Note (Signed)
Summary: Acute NP office visit - bronchitis   Primary Provider/Referring Provider:  Alroy Dust, MD  CC:  hoarseness/loss of voice, sore throat, constant cough occ producing light yellow mucus, wheezing, decreased appetite x5days - denies f/c/s - has been treating with mucinex dm, salt water gargles, and and delsym.  History of Present Illness: 77  y/o WM here for a follow up visit... he has multiple medical problems as noted below...    ~  Dec09:  episode of severe abd pain lasting 14hrs... the pain was mid-abdominal and ?assoc w/ bloating/ swelling, but he denies N/ V/ RUQ pain or tender, no D/ C/ change in BM's, blood etc... he noted a bulge in right groin 5-6 months ago and immed saw DrSherrill who Dx a RIH... had Urology f/u w/ DrBorden who concurred w/ RIH dx, but it was non-tender and easily reduced so they rec conservative approach- watch it... pain is gone at present and his exam neg- except for [facility] that easily reduces when supine...  ~  saw GI w/ persistant/ intermittent abd pain... colonoscopy 01/16/08 by DrStark w/ divertics, ?ileitis of terminal ileum?, hems... 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 bx of term ileum without inflamm, & clinically no better on Pred Rx, therefore sent to surg for Lap...  ~  had Dx laparascopy 02/16/08 by Clyde Lundborg w/ finding of RIH containing terminal ileum & cecum w/ part obstruction- hernia repaired w/ mesh...   ~  March 06, 2008:  Improved overall but still weak and mult concerns about his evaluation- all discussed w/ pt & wife today in the office... lab work done 2/15 & looks OK.   ~  July 05, 2008:  stable- doing satis, labs 07/02/08 reviewed w/ pt...      ** saw DrRamos 3/10 for f/u pain management/ sp stenosis L4-5: prev Gabitril stopped working for him, offered sp cord stim/ selective nerve root blocks (holding off unless  absolutely needed), on LYRICA now 150mg Qhs w/ mild benefit...      ** saw Urology, DrBorden 3/10 for f/u bladder Ca, BPH, elevated PSA, ED... neg cystoscopy w/o recurrent bladder tumors, incr PSA velocity to 3.21 on Finasteride (continue surveillance for now), on Terazosin + Finasteride Rx for BPH... March 24, 2009--Presents for an acute office visit. Complains of hoarseness/loss of voice, sore throat, constant cough occ producing light yellow mucus, wheezing, decreased appetite x5days - Has been treating with mucinex dm, salt water gargles, and delsym sleep deprived from couging so much for last week. Denies chest pain, dyspnea, orthopnea, hemoptysis, fever, n/v/d, edema, headache,recent travel or antibiotics.   Medications Prior to Update: 1)  Adult Aspirin Ec Low Strength 81 Mg  Tbec (Aspirin) .... Take One Tablet By Mouth Every Other Day 2)  Lisinopril-Hydrochlorothiazide 20-12.5 Mg  Tabs (Lisinopril-Hydrochlorothiazide) .... Take 1 Tablet By Mouth Once A Day 3)  Terazosin Hcl 5 Mg  Caps (Terazosin Hcl) .... Take One Capsule Daily 4)  Finasteride 5 Mg  Tabs (Finasteride) .... Take 1 Tablet By Mouth Once A Day 5)  Saw Palmetto 160 Mg  Caps (Saw Palmetto (Serenoa Repens)) .... Take 1 Tablet By Mouth Once A Day 6)  Cyclobenzaprine Hcl 10 Mg  Tabs (Cyclobenzaprine Hcl) .... Take 1 Tablet By Mouth At Bedtime.Marland KitchenMarland Kitchen 7)  Calcium 500/d 500-200 Mg-Unit  Tabs (Calcium Carbonate-Vitamin D) .... Take 1 Tablet By Mouth Once A Day 8)  Multivitamins   Tabs (Multiple  Vitamin) .... Take 1 Tablet By Mouth Once A Day 9)  Cvs Selenium 200 Mcg  Tabs (Selenium) .... Take 1 Tablet By Mouth Once A Day 10)  Cyproheptadine Hcl 4 Mg  Tabs (Cyproheptadine Hcl) .... Take One Tablet By Mouth Two Times A Day As Needed 11)  Triamcinolone in Absorbase 0.05 %  Oint (Triamcinolone Acetonide) .... Use Daily 12)  Lyrica 150 Mg Caps (Pregabalin) .... Take 1 Tablet By Mouth Once A Day 13)  Zostavax .... To Be Given By  Pharmacist  Current Medications (verified): 1)  Adult Aspirin Ec Low Strength 81 Mg  Tbec (Aspirin) .... Take One Tablet By Mouth Every Other Day 2)  Lisinopril-Hydrochlorothiazide 20-12.5 Mg  Tabs (Lisinopril-Hydrochlorothiazide) .... Take 1 Tablet By Mouth Once A Day 3)  Terazosin Hcl 5 Mg  Caps (Terazosin Hcl) .... Take One Capsule Daily 4)  Finasteride 5 Mg  Tabs (Finasteride) .... Take 1 Tablet By Mouth Once A Day 5)  Saw Palmetto 160 Mg  Caps (Saw Palmetto (Serenoa Repens)) .... Take 1 Tablet By Mouth Once A Day 6)  Cyclobenzaprine Hcl 10 Mg  Tabs (Cyclobenzaprine Hcl) .... Take 1 Tablet By Mouth At Bedtime.Marland KitchenMarland Kitchen 7)  Calcium 500/d 500-200 Mg-Unit  Tabs (Calcium Carbonate-Vitamin D) .... Take 1 Tablet By Mouth Once A Day 8)  Multivitamins   Tabs (Multiple Vitamin) .... Take 1 Tablet By Mouth Once A Day 9)  Cvs Selenium 200 Mcg  Tabs (Selenium) .... Take 1 Tablet By Mouth Once A Day 10)  Cyproheptadine Hcl 4 Mg  Tabs (Cyproheptadine Hcl) .... Take One Tablet By Mouth Two Times A Day As Needed 11)  Triamcinolone in Absorbase 0.05 %  Oint (Triamcinolone Acetonide) .... Use Daily 12)  Lyrica 150 Mg Caps (Pregabalin) .... Take One Capsule By Mouth Every Other Day 13)  Zostavax .... To Be Given By Pharmacist  Allergies (verified): No Known Drug Allergies  Past History:  Past Medical History: Last updated: 08/08/2008 HYPERTENSION (ICD-401.9) - on LISINOPRIL/ HCT 20-12.5 daily & HYTRIN 5mg /d...    PERIPHERAL VASCULAR DISEASE (ICD-443.9) - CT Abd 12/09 showed left common iliac art aneurysm= 2.1cm amoung other things...   DIABETES MELLITUS, BORDERLINE (ICD-790.29) - on diet alone and his weight is back up to 202# today (up 22# in the last 4 mo)... we discussed diet therapy & try to find some type of exercise that doesn't hurt his back to aid wt reduction.  ~  labs over the last 2 yrs showed BS's 114 - 123  ~  labs 07/10/07 showed FBS= 115... continue diet efforts (it's hard for him to exerc due  to back pain).   ~  Chol values are good on diet alone- TChol 173, TG 55, HDL 75, LDL 87  ~  BS 2/10 = 98... rec- continue diet Rx.  ~  labs 6/10 showed BS= 109, TChol 179, TG 50, HDL 81, LDL 88  GERD (ICD-530.81) - mild symptoms, uses OTC meds as needed.  DIVERTICULOSIS, COLON (ICD-562.10), INTERNAL HEMORRHOIDS (ICD-455.0),  COLON POLYP (ICD-211.3) - last colonoscopy 10/08 by DrStark showed divertics, hems, sm 4mm polyp= tubular adenoma...  ~  colonoscopy repeated 12/09 as above w/ divertics, ?ileitis of terminal ileum?, hems...   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 bx of term ileum without inflamm, & clinically no better on Pred Rx, therefore sent to surg for Lap= RIH w/ cecum & TI.  BENIGN PROSTATIC HYPERTROPHY, HX OF (ICD-V13.8) - on TERAZOSIN 5mg /d + FINASTERIDE 5mg /d... ELEVATED PROSTATE SPECIFIC ANTIGEN (ICD-790.93) Hx of BLADDER CANCER (ICD-188.9) - all followed by DrHumphries & now DrBorden- seen 9/09 w/ Dx of Bladder Tumor (papillary neoplasm), s/p TURBT 6/05 & gets surveillance cysto's yearly;  Hx elevated PSA's that decreased appropriately on Proscar;  BPH w/ nocturia;  ED...    Past Surgical History: Last updated: 08/08/2008 BACK PAIN, LUMBAR (ICD-724.2) - severely limited in exercise due to LBP... on LYRICA 150mg Qhs, FLEXERIL 10mg Prn. SPINAL STENOSIS (ICD-724.00) - followed by DrRamos (Pain), DrWainer (Ortho), DrNudelman (NS)...  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 he has been in clinical remission ever since then... followed Q62months by DrSherril... DrTurner is his Dermatologist...  DERM - he takes PERIACTIN 4mg BidPrn & TRIAMCINOLONE cream per DrTurner... he has several ecchymoses... he is concerned about his 81mg  ASA  daily- he notes that his skin is very thin and he bruises easily "ugly damn marks appear w/ the slightest touch"... he wants to decr his ASA to every other day... OK.  Cystoscopic resection of bladder superficial ca in 2005 Sebacious cyst from Rt. Buttock (1999) Pilonidal Cyst Tonsillectomy S/P cataract surgery S/P diagnostic laparoscopy w/ RIH containing cecum & TI repaired 1/10 by DrRosenbower  Family History: Last updated: 01/30/2008 Family History of Kidney Disease:Father No FH of Colon Cancer:  Social History: Last updated: 03/06/2008 Married Children Patient is a former smoker.  no alcohol Daily Caffeine - 2 cups semi-retired accountant  Risk Factors: Smoking Status: quit (03/06/2008)  Review of Systems      See HPI  Vital Signs:  Patient profile:   77 year old male Height:      69 inches Weight:      205.25 pounds O2 Sat:      96 % on Room air Temp:     98.6 degrees F oral Pulse rate:   78 / minute BP sitting:   134 / 80  (left arm) Cuff size:   regular  Vitals Entered By: Gweneth Dimitri RN (March 24, 2009 4:59 PM)  O2 Flow:  Room air CC: hoarseness/loss of voice, sore throat, constant cough occ producing light yellow mucus, wheezing, decreased appetite x5days - denies f/c/s - has been treating with mucinex dm, salt water gargles, and delsym Is Patient Diabetic? No Comments Medications reviewed with patient Daytime contact number verified with patient. Gweneth Dimitri RN  March 24, 2009 4:59 PM    Physical Exam  Additional Exam:  WD, [phone]  y/o WM in NAD...  GENERAL:  Alert & oriented; pleasant & cooperative. HEENT:  [place]/AT,  EACs-clear, TMs-wnl, NOSE-clear, THROAT-clear & wnl., oral mucosa pink moist no lesions, no swelling,  NECK:  Supple w/ fairROM; no JVD; normal carotid impulses w/o bruits; no thyromegaly or nodules palpated; no lymphadenopathy. CHEST:  Coarse BS w/ no wheeizng  HEART:  Regular Rhythm;  gr 1/6 SEM w/o rubs or gallops  detected... ABDOMEN:  Soft & sl tender right groin- s/p RIH repair; normal bowel sounds; no organomegaly or masses detected. EXT: without deformities, mod arthritic changes; no varicose veins/ +venous insuffic/ or edema. NEURO:  CN's intact;  no focal neuro deficits...Marland Kitchenno facial droop DERM:  No lesions noted; +ecchymoses along hand/forearms     Impression & Recommendations:  Problem # 1:  UPPER RESPIRATORY INFECTION, ACUTE (ICD-465.9)  Zpack take as directed.  Mucinex  DM two times a day as needed  Hydromet 1/2 -1 tsp every 4-6 hr as needed cough, congestion  Increase fluids, rest and tylenol as needed  Please contact office for sooner follow up if symptoms do not improve or worsen   His updated medication list for this problem includes:    Adult Aspirin Ec Low Strength 81 Mg Tbec (Aspirin) .Marland Kitchen... Take one tablet by mouth every other day    Cyproheptadine Hcl 4 Mg Tabs (Cyproheptadine hcl) .Marland Kitchen... Take one tablet by mouth two times a day as needed    Hydromet 5-1.5 Mg/63ml Syrp (Hydrocodone-homatropine) .Marland Kitchen... 1/2-1 tsp every 4-6 hr as needed cough, may make you sleepy  Orders: Est. Patient Level III ([address])  Medications Added to Medication List This Visit: 1)  Lyrica 150 Mg Caps (Pregabalin) .... Take one capsule by mouth every other day 2)  Zithromax Z-pak 250 Mg Tabs (Azithromycin) .... Take as directed 3)  Hydromet 5-1.5 Mg/59ml Syrp (Hydrocodone-homatropine) .... 1/2-1 tsp every 4-6 hr as needed cough, may make you sleepy  Complete Medication List: 1)  Adult Aspirin Ec Low Strength 81 Mg Tbec (Aspirin) .... Take one tablet by mouth every other day 2)  Lisinopril-hydrochlorothiazide 20-12.5 Mg Tabs (Lisinopril-hydrochlorothiazide) .... Take 1 tablet by mouth once a day 3)  Terazosin Hcl 5 Mg Caps (Terazosin hcl) .... Take one capsule daily 4)  Finasteride 5 Mg Tabs (Finasteride) .... Take 1 tablet by mouth once a day 5)  Saw Palmetto 160 Mg Caps (Saw palmetto (serenoa repens)) ....  Take 1 tablet by mouth once a day 6)  Cyclobenzaprine Hcl 10 Mg Tabs (Cyclobenzaprine hcl) .... Take 1 tablet by mouth at bedtime.Marland KitchenMarland Kitchen 7)  Calcium 500/d 500-200 Mg-unit Tabs (Calcium carbonate-vitamin d) .... Take 1 tablet by mouth once a day 8)  Multivitamins Tabs (Multiple vitamin) .... Take 1 tablet by mouth once a day 9)  Cvs Selenium 200 Mcg Tabs (Selenium) .... Take 1 tablet by mouth once a day 10)  Cyproheptadine Hcl 4 Mg Tabs (Cyproheptadine hcl) .... Take one tablet by mouth two times a day as needed 11)  Triamcinolone in Absorbase 0.05 % Oint (Triamcinolone acetonide) .... Use daily 12)  Lyrica 150 Mg Caps (Pregabalin) .... Take one capsule by mouth every other day 13)  Zostavax  .... To be given by pharmacist 14)  Zithromax Z-pak 250 Mg Tabs (Azithromycin) .... Take as directed 15)  Hydromet 5-1.5 Mg/59ml Syrp (Hydrocodone-homatropine) .... 1/2-1 tsp every 4-6 hr as needed cough, may make you sleepy  Patient Instructions: 1)  Zpack take as directed.  2)  Mucinex DM two times a day as needed  3)  Hydromet 1/2 -1 tsp every 4-6 hr as needed cough, congestion  4)  Increase fluids, rest and tylenol as needed  5)  Please contact office for sooner follow up if symptoms do not improve or worsen  Prescriptions: HYDROMET 5-1.5 MG/5ML SYRP (HYDROCODONE-HOMATROPINE) 1/2-1 tsp every 4-6 hr as needed cough, may make you sleepy  #8 oz x 0    Entered and Authorized by:   Rubye Oaks NP   Signed by:   Tammy Parrett NP on 03/24/2009   Method used:   Print then Give to Patient   RxID:   [id number] ZITHROMAX Z-PAK 250 MG TABS (AZITHROMYCIN) take as directed  #1 x 0   Entered and Authorized by:   Rubye Oaks NP   Signed by:   Tammy Parrett NP on 03/24/2009   Method used:   Electronically to  [facility]* (retail)       [address]       [place], [place]  [id number]       Ph: [phone]       Fax: [phone]   RxID:   [id number]

## 2010-02-19 NOTE — Letter (Signed)
[facility]: [facility]   Imported By: Sherian Rein 11/21/2009 11:27:33  _____________________________________________________________________  External Attachment:    Type:   Image     Comment:   External Document

## 2010-02-19 NOTE — Progress Notes (Signed)
Summary: ? about recall date   Phone Note Call from Patient Call back at Home Phone [phone] Call back at Work Phone [phone]   Caller: Patient Call For: Dr. Russella Dar Summary of Call: the patietn has a complex history and had a diagonistic lap of his lower intestine with Dr. Abbey Chatters in 01/2008 and said he just had a colonsocopy 2009 and he is not sure why he needs another one. He would like to make sure the Dr. Russella Dar has all of her records before he decides about a recall colon. Initial call taken by: Harlow Mares CMA Duncan Dull),  October 16, 2009 10:09 AM  Follow-up for Phone Call        Patient had surgery for incarcerated hernia in 02/2008.  He feels that the recall is premature.  Last colon was 01/16/08.  Please review and advise if recall is correct. Follow-up by: Darcey Nora RN, CGRN,  October 16, 2009 10:48 AM  Additional Follow-up for Phone Call Additional follow up Details #1::        He is correct. I missed the colonoscopy report in EMR in 12/2007 which was not in the paper chart. No colonoscopy recall needed. Sorry for the letter it should not have been sent. Additional Follow-up by: Meryl Dare MD Clementeen Graham,  October 16, 2009 11:40 AM    Additional Follow-up for Phone Call Additional follow up Details #2::    patient notified. Follow-up by: Darcey Nora RN, CGRN,  October 16, 2009 11:53 AM

## 2010-02-19 NOTE — Letter (Signed)
Summary: Colonoscopy Letter  [facility]  [address] [place], [place] [address]   Phone: [phone]  Fax: [phone]      October 14, 2009 MRN: [id number]   JACKIE LITTLEJOHN [address] [place], [place]  [address]   Dear Mr. Coghill,   According to your medical record, it is time for you to schedule a Colonoscopy. The American Cancer Society recommends this procedure as a method to detect early colon cancer. Patients with a family history of colon cancer, or a personal history of colon polyps or inflammatory bowel disease are at increased risk.  This letter has beeen generated based on the recommendations made at the time of your procedure. If you feel that in your particular situation this may no longer apply, please contact our office.  Please call our office at [phone] to schedule this appointment or to update your records at your earliest convenience.  Thank you for cooperating with [place] to provide you with the very best care possible.   Sincerely,  Judie Petit T. Russella Dar, M.D.  [facility] [phone]

## 2010-02-19 NOTE — Progress Notes (Signed)
Summary: cough  Phone Note Call from Patient   Caller: Patient Call For: parrett Summary of Call: have cough would like to be worked in today Initial call taken by: Rickard Patience,  March 24, 2009 8:15 AM  Follow-up for Phone Call        Pt c/o cough for 5 days.  Occas Prod (light yellow).  Extreme fatigue.  Diff sleeping due to cough.  Denies fever.  Hoarseness.  Denies SOB. PT requests to be seen.  Tammy sch full.  Please advise. Abigail Miyamoto RN  March 24, 2009 9:29 AM   Additional Follow-up for Phone Call Additional follow up Details #1::        pt refuses meds wants ov with tp--appt given for toda at 4:30 Additional Follow-up by: Randell Loop CMA,  March 24, 2009 2:56 PM

## 2010-02-19 NOTE — Letter (Signed)
Summary: [facility]  [facility]   Imported By: Lanelle Bal 01/23/2009 13:13:40  _____________________________________________________________________  External Attachment:    Type:   Image     Comment:   External Document

## 2010-02-19 NOTE — Progress Notes (Signed)
Summary: info-awaiting paper chart  Phone Note Call from Patient Call back at Home Phone [phone]   Caller: Patient Call For: Carleena Mires Reason for Call: Talk to Nurse Summary of Call: when was last PNA shot and how long does it last? Initial call taken by: Eugene Gavia,  February 21, 2009 1:12 PM  Follow-up for Phone Call        Not documented in EMR-paper chart requested.  Crystal Jones RN  February 21, 2009 2:05 PM  Last PNA vaccine was in 1998. Does he need another one? Pelase advise. Carron Curie CMA  February 24, 2009 5:16 PM  Should pt come in for a PNA shot?  Follow-up by: Eugene Gavia,  February 25, 2009 10:15 AM  Additional Follow-up for Phone Call Additional follow up Details #1::        per SN---only needs one shot after age 44 are the new recs now---so he will not need another booster unless these rec's change again.  thanks Randell Loop CMA  February 25, 2009 11:55 AM   pt advised. Carron Curie CMA  February 25, 2009 12:02 PM

## 2010-02-19 NOTE — Assessment & Plan Note (Signed)
Summary: rov fasting/apc   Primary Care Provider:  Alroy Dust, MD  CC:  Yearly ROV & review of mult medical problems....  History of Present Illness: 77 y/o WM here for a follow up visit... he has multiple medical problems as noted below...    ~  Dec09:  episode of severe abd pain lasting 14hrs... the pain was mid-abdominal and ?assoc w/ bloating/ swelling, but he denies N/ V/ RUQ pain or tender, no D/ C/ change in BM's, blood etc... he noted a bulge in right groin 5-6 months ago and immed saw DrSherrill who Dx a RIH... had Urology f/u w/ DrBorden who concurred w/ RIH dx, but it was non-tender and easily reduced so they rec conservative approach- watch it... pain is gone at present and his exam neg- except for [facility] that easily reduces when supine...  ~  saw GI w/ persistant/ intermittent abd pain... colonoscopy 01/16/08 by DrStark w/ divertics, ?ileitis of terminal ileum?, hems... 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 bx of term ileum without inflamm, & clinically no better on Pred Rx, therefore sent to surg for Lap...  ~  had Dx laparascopy 02/16/08 by Clyde Lundborg w/ finding of RIH containing terminal ileum & cecum w/ part obstruction- hernia repaired w/ mesh...  ~  Feb10:  Improved overall but still weak and mult concerns about his evaluation- all discussed w/ pt & wife today in the office... lab work done 2/15 & looks OK.   ~  July 05, 2008:  stable- doing satis, labs 07/02/08 reviewed w/ pt... he saw DrRamos 3/10 for f/u pain management/ sp stenosis L4-5: prev Gabitril stopped working for him, offered sp cord stim/ selective nerve root blocks (holding off unless absolutely needed), on LYRICA now 150mg Qhs w/ mild benefit... he saw Urology, DrBorden 3/10 for f/u bladder Ca, BPH, elevated PSA, ED... neg cystoscopy w/o recurrent bladder tumors, incr PSA velocity to 3.21 on Finasteride  (continue surveillance for now), on Terazosin + Finasteride Rx for BPH... he saw DrWainer recently w/ cortisone shot in left shoulder- sl better now... he saw DrSherrill last week for f/u cutaneous lymphoma- doing well, no recurrence...   ~  July 11, 2009:  yearly ROV- stable w/ severe pain from sp stenosis right buttock & leg> DrRamos Rx w/ Lyrica 150mg Qhs & he has good days & bad... he had bronchitic infection 3/11- resolved w/ ZPak & Pred... BP controlled on meds;  DM borderline- doing satis on diet alone;  GI is stable;  GU followed by DrBorden- had cysto 4/11= neg & he felt they could stop the cystoscopic surveilance, but he wants to continue PSA's Q49mo... f/u fasting labs last week reviewed w/ pt- doing well, same meds.    Current Problem List:  **  his last Tetanus shot=2002, last Pneumovax=1998, had Shingles vaccine 10/10, gets yearly Flu shots...   HYPERTENSION (ICD-401.9) - on LISINOPRIL/ HCT 20-12.5 daily & HYTRIN 5mg /d... BP= 100/60 today & 120/70 at DrSherrills office 6/11- feeling well... home BP checks ave 120-130/ 70's... denies HA, fatigue, visual changes, CP, palipit, dizziness, syncope, dyspnea, edema, etc...  PERIPHERAL VASCULAR DISEASE (ICD-443.9) - CT Abd 12/09 showed left common iliac art aneurysm= 2.1cm amoung other things...   DIABETES MELLITUS, BORDERLINE (ICD-790.29) - on diet alone and his weight is stable at  ~200#... we discussed diet therapy & try to find some type of exercise that doesn't hurt his back to aid wt reduction.  ~  labs over the last 2 yrs showed BS's 114 - 123  ~  labs 07/10/07 showed FBS= 115... continue diet efforts (it's hard for him to exerc due to back pain).   ~  Chol values are good on diet alone- TChol 173, TG 55, HDL 75, LDL 87  ~  BS 2/10 = 98... rec- continue diet Rx.  ~  labs 6/10 showed BS=109, TChol 179, TG 50, HDL 81, LDL 88  ~  labs 6/11 showed BS=107, TChol 186, TG 78, HDL 78, LDL 93  GERD (ICD-530.81) - mild symptoms, uses OTC meds as  needed.  DIVERTICULOSIS, COLON (ICD-562.10), INTERNAL HEMORRHOIDS (ICD-455.0),  COLON POLYP (ICD-211.3)  ~  colonoscopy 10/08 by DrStark showed divertics, hems, sm 4mm polyp= tubular adenoma...  ~  colonoscopy repeated 12/09 as above w/ divertics, ?ileitis of terminal ileum?, hems...   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 bx of term ileum without inflamm, & clinically no better on Pred Rx, therefore sent to surg for Lap= RIH w/ cecum & TI.  BENIGN PROSTATIC HYPERTROPHY, HX OF (ICD-V13.8) - on TERAZOSIN 5mg /d + FINASTERIDE 5mg /d... ELEVATED PROSTATE SPECIFIC ANTIGEN (ICD-790.93) - followed by DrBorden... Hx of BLADDER CANCER (ICD-188.9) - all followed by DrHumphries & now DrBorden- seen 9/09 w/ Dx of Bladder Tumor (papillary neoplasm), s/p TURBT 6/05 & gets surveillance cysto's yearly;  Hx elevated PSA's that decreased appropriately on Proscar;  BPH w/ nocturia;  ED...  ~  seen 3/10 w/ neg cystoscopy, increased PSA velocity on Finasteride (PSA 3.21), & BPH symptoms stable.  ~  seen 4/11 w/ neg cysto & decision made to stop surveillance cystos, he wants to continue Q31mo PSA's etc...  BACK PAIN, LUMBAR (ICD-724.2) - severely limited in exercise due to LBP... on LYRICA 150mg Qhs, FLEXERIL 10mg Tid Prn. SPINAL STENOSIS (ICD-724.00) - followed by DrRamos (Pain), DrWainer (Ortho), DrNudelman (NS)...  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 he has been in clinical remission ever since then... followed Q96months by DrSherril... DrTurner is his Dermatologist...  DERM - he takes PERIACTIN 4mg BidPrn & TRIAMCINOLONE cream per DrTurner... he has several ecchymoses... he is concerned about his 81mg  ASA daily- he notes that his skin is very thin and he bruises  easily "ugly damn marks appear w/ the slightest touch"... he wants to decr his ASA to every other day... OK.   Allergies (verified): No Known Drug Allergies  Comments:  Nurse/Medical Assistant: The patient's medications and allergies were reviewed with the patient and were updated in the Medication and Allergy Lists.  Past History:  Past Medical History: HYPERTENSION (ICD-401.9) PERIPHERAL VASCULAR DISEASE (ICD-443.9) DIABETES MELLITUS, BORDERLINE (ICD-790.29) GERD (ICD-530.81) DIVERTICULOSIS, COLON (ICD-562.10) INTERNAL HEMORRHOIDS (ICD-455.0) POLYP, COLON (ICD-211.3) Hx of INGUINAL HERNIA, RIGHT (ICD-550.90) BENIGN PROSTATIC HYPERTROPHY, HX OF (ICD-V13.8) ELEVATED PROSTATE SPECIFIC ANTIGEN (ICD-790.93) Hx of BLADDER CANCER (ICD-188.9) BACK PAIN, LUMBAR (ICD-724.2) SPINAL STENOSIS (ICD-724.00) NON-HODGKIN'S LYMPHOMA, HX OF (ICD-V10.79)  Past Surgical History: Cystoscopic resection of bladder superficial ca in 2005 [place] cyst from Rt. Buttock (1999) Pilonidal Cyst Tonsillectomy S/P cataract surgery S/P diagnostic laparoscopy w/ RIH containing cecum & TI repaired 1/10 by DrRosenbower  Family History: Reviewed history from 01/30/2008 and no changes required. Family History of Kidney Disease:Father No FH of Colon Cancer:  Social History: Reviewed history from 03/06/2008 and no changes required.  Married Children Patient is a former smoker.  no alcohol Daily Caffeine - 2 cups semi-retired accountant  Review of Systems      See HPI       The patient complains of dyspnea on exertion and difficulty walking.  The patient denies anorexia, fever, weight loss, weight gain, vision loss, decreased hearing, hoarseness, chest pain, syncope, peripheral edema, prolonged cough, headaches, hemoptysis, abdominal pain, melena, hematochezia, severe indigestion/heartburn, hematuria, incontinence, muscle weakness, suspicious skin lesions, transient blindness, depression, unusual  weight change, abnormal bleeding, enlarged lymph nodes, and angioedema.    Vital Signs:  Patient profile:   77 year old male Height:      69 inches Weight:      204 pounds BMI:     30.23 O2 Sat:      96 % on Room air Temp:     97.7 degrees F oral Pulse rate:   113 / minute BP sitting:   100 / 60  (left arm) Cuff size:   regular  Vitals Entered By: Randell Loop CMA (July 11, 2009 10:30 AM)  O2 Sat at Rest %:  96 O2 Flow:  Room air CC: Yearly ROV & review of mult medical problems... Is Patient Diabetic? No Pain Assessment Patient in pain? yes      Onset of pain  FROM SPINAL STENOSIS Comments MEDS UPDATED TODAY WITH PT   Physical Exam  Additional Exam:  WD, overweight, 77 y/o WM in NAD...  GENERAL:  Alert & oriented; pleasant & cooperative... HEENT:  [place]/AT, EOM-full, PERRLA, EACs-clear, TMs-wnl, NOSE-clear, THROAT-clear & wnl. NECK:  Supple w/ fairROM; no JVD; normal carotid impulses w/o bruits; no thyromegaly or nodules palpated; no lymphadenopathy. CHEST:  Coarse BS but clear w/o wheezing, rales, or rhonchi... HEART:  Regular Rhythm;  gr 1/6 SEM w/o rubs or gallops detected... ABDOMEN:  Soft & sl tender right groin- s/p RIH repair; normal bowel sounds; no organomegaly or masses palpated. EXT: without deformities, mod arthritic changes; no varicose veins/ +venous insuffic/ or edema. NEURO:  CN's intact;  no focal neuro deficits...Marland Kitchenno facial droop DERM:  No lesions noted; +ecchymoses along hand/forearms    MISC. Report  Procedure date:  07/08/2009  Findings:      Lipid Panel (LIPID)   Cholesterol               186 mg/dL                   1-914   Triglycerides             78.0 mg/dL                  7.8-295.6   HDL                       21.30 mg/dL                 >86.57   LDL Cholesterol           93 mg/dL                    8-46  BMP (METABOL)   Sodium                    137 mEq/L                   135-145   Potassium  3.9 mEq/L                    3.5-5.1   Chloride                  100 mEq/L                   96-112   Carbon Dioxide            28 mEq/L                    19-32   Glucose              [H]  107 mg/dL                   08-65   BUN                       22 mg/dL                    7-84   Creatinine                1.2 mg/dL                   6.9-6.2   Calcium                   9.1 mg/dL                   9.5-28.4   GFR                       64.41 mL/min                >60  Hepatic/Liver Function Panel (HEPATIC)   Total Bilirubin           0.9 mg/dL                   1.3-2.4   Direct Bilirubin          0.2 mg/dL                   4.0-1.0   Alkaline Phosphatase      42 U/L                      39-117   AST                       30 U/L                      0-37   ALT                       20 U/L                      0-53   Total Protein             6.8 g/dL                    2.7-2.5   Albumin                   4.1 g/dL                    3.6-6.4  Comments:  CBC Platelet w/Diff (CBCD)   White Cell Count          7.4 K/uL                    4.5-10.5   Red Cell Count            4.41 Mil/uL                 4.22-5.81   Hemoglobin                13.5 g/dL                   04.5-40.9   Hematocrit                39.4 %                      39.0-52.0   MCV                       89.5 fl                     78.0-100.0   Platelet Count            180.0 K/uL                  150.0-400.0   Neutrophil %              64.4 %                      43.0-77.0   Lymphocyte %              20.9 %                      12.0-46.0   Monocyte %                7.8 %                       3.0-12.0   Eosinophils%         [H]  6.3 %                       0.0-5.0   Basophils %               0.6 %                       0.0-3.0  TSH (TSH)   FastTSH                   1.19 uIU/mL                 0.35-5.50  Vitamin D (25-Hydroxy) (81191)  Vitamin D (25-Hydroxy)                             48 ng/mL                    30-89   Impression &  Recommendations:  Problem # 1:  HYPERTENSION (ICD-401.9) BP controlled on meds>  pressure sl low but he notes better BP at home & recently at DrSherrill's office... continue same. His updated medication list for this problem includes:    Lisinopril-hydrochlorothiazide 20-12.5 Mg Tabs (Lisinopril-hydrochlorothiazide) .Marland KitchenMarland KitchenMarland KitchenMarland Kitchen  Take 1 tablet by mouth once a day    Terazosin Hcl 5 Mg Caps (Terazosin hcl) .Marland Kitchen... Take one capsule daily  Problem # 2:  DIABETES MELLITUS, BORDERLINE (ICD-790.29) Borderline & diet controlled> BS=107 & his weight is stable... we discussed low carb weight reducing diet.  Problem # 3:  GERD (ICD-530.81) GI is stable w/ hx GERD- on OTC meds Prn, Divertics, hx RIH repair w/ cecum & TI reduced> no further problems reported, regular BMs etc...  Problem # 4:  Hx of BLADDER CANCER (ICD-188.9) Followed by DrBorden & his note from 04/25/09 is reviewed> Bladder cancer w/ TURBT, f/u cystos all neg including 4/11 & he suggested stopping surveillance cystos... hx elev PSA followed serially w/ watchful waiting... BPH w/ nocturia> on Finasteride & Terazosin...  Problem # 5:  SPINAL STENOSIS (ICD-724.00) Severe back probs> followed by DrRamos...  Problem # 6:  NON-HODGKIN'S LYMPHOMA, HX OF (ICD-V10.79) Followed by DrSherrill...  Problem # 7:  OTHER MEDICAL PROBLEMS AS NOTED>>> As he follows up w/ Urology Q26mo, and DrSherrill Q48mo> we will plan yearly ROV w/ labs and check him Prn in between...  Complete Medication List: 1)  Adult Aspirin Ec Low Strength 81 Mg Tbec (Aspirin) .... Take one tablet by mouth every other day 2)  Lisinopril-hydrochlorothiazide 20-12.5 Mg Tabs (Lisinopril-hydrochlorothiazide) .... Take 1 tablet by mouth once a day 3)  Terazosin Hcl 5 Mg Caps (Terazosin hcl) .... Take one capsule daily 4)  Finasteride 5 Mg Tabs (Finasteride) .... Take 1 tablet by mouth once a day 5)  Saw Palmetto 160 Mg Caps (Saw palmetto (serenoa repens)) .... Take 1 tablet by mouth once a  day 6)  Lyrica 150 Mg Caps (Pregabalin) .... Take 1 cap by mouth once daily.Marland KitchenMarland Kitchen 7)  Cyclobenzaprine Hcl 10 Mg Tabs (Cyclobenzaprine hcl) .... Take 1 tab by mouth three times a day as needed for muscle spasm... 8)  Calcium 500/d 500-200 Mg-unit Tabs (Calcium carbonate-vitamin d) .... Take 1 tablet by mouth once a day 9)  Multivitamins Tabs (Multiple vitamin) .... Take 1 tablet by mouth once a day 10)  Cvs Selenium 200 Mcg Tabs (Selenium) .... Take 1 tablet by mouth once a day 11)  Cyproheptadine Hcl 4 Mg Tabs (Cyproheptadine hcl) .... Take one tablet by mouth two times a day as needed 12)  Triamcinolone in Absorbase 0.05 % Oint (Triamcinolone acetonide) .... Use daily  Patient Instructions: 1)  Today we updated your med list- see below.... 2)  Continue your current meds the same... 3)  Today we reviewed your recent Fasting blood work... 4)  Call for any questions.Marland KitchenMarland Kitchen 5)  Please schedule a follow-up appointment in 1 yr, sooner as needed.   Immunization History:  Influenza Immunization History:    Influenza:  historical (12/05/2008)

## 2010-02-19 NOTE — Progress Notes (Signed)
Summary: labs  Phone Note Call from Patient Call back at Home Phone [phone] Call back at Work Phone [phone]   Caller: Patient Call For: nadel Reason for Call: Talk to Nurse Summary of Call: pt wants to come in Monday or Tuesday for labs on a Friday 06/24 appt. Initial call taken by: Eugene Gavia,  July 02, 2009 2:12 PM  Follow-up for Phone Call        Pt requesting to come in this coming Monday or Tuesday for labs for his appt on June 24 with SN.  Pt states he does not need PSA because he has this done with his urologist.  Per pt, he does not need a call back unless there are questions.  Will forward message to SN-pls advise of labs.  Thanks! Gweneth Dimitri RN  July 02, 2009 3:07 PM   Additional Follow-up for Phone Call Additional follow up Details #1::        per SN---lip-272.0/bmp-401.9/hepat-790.5/cbcs-285.9/tsh-244.9/vit d-733.00/leave/thanks Randell Loop CMA  July 02, 2009 3:35 PM     Additional Follow-up for Phone Call Additional follow up Details #2::    labs entered in IDX.  WIll sign off on note.  Gweneth Dimitri RN  July 02, 2009 3:48 PM

## 2010-02-19 NOTE — Letter (Signed)
Summary: [facility]  [facility]   Imported By: Lennie Odor 05/13/2009 12:08:57  _____________________________________________________________________  External Attachment:    Type:   Image     Comment:   External Document

## 2010-04-29 ENCOUNTER — Telehealth: Payer: Self-pay | Admitting: Pulmonary Disease

## 2010-04-29 NOTE — Telephone Encounter (Signed)
Spoke w/ pt and advised pt to call back when it was around time for his OV

## 2010-04-29 NOTE — Telephone Encounter (Signed)
Per Marliss Czar, cpx is in June.  Will need to call closer to the time for lab orders.  Thanks.

## 2010-04-29 NOTE — Telephone Encounter (Signed)
Pls advise on labs.  

## 2010-05-04 LAB — COMPREHENSIVE METABOLIC PANEL
ALT: 19 U/L (ref 0–53)
AST: 23 U/L (ref 0–37)
CO2: 28 mEq/L (ref 19–32)
Calcium: 9 mg/dL (ref 8.4–10.5)
Chloride: 93 mEq/L — ABNORMAL LOW (ref 96–112)
GFR calc Af Amer: >60 mL/min (ref >60)
GFR calc non Af Amer: 60 mL/min — ABNORMAL LOW (ref >60)
Potassium: 3.3 mEq/L — ABNORMAL LOW (ref 3.5–5.1)
Sodium: 131 mEq/L — ABNORMAL LOW (ref 135–145)
Total Bilirubin: 1.4 mg/dL — ABNORMAL HIGH (ref 0.3–1.2)

## 2010-05-04 LAB — ABO/RH: ABO/RH(D): O POS

## 2010-05-04 LAB — CBC
RBC: 4.36 MIL/uL (ref 4.22–5.81)
WBC: 11.5 10*3/uL — ABNORMAL HIGH (ref 4.0–10.5)

## 2010-05-04 LAB — DIFFERENTIAL
Eosinophils Absolute: 0 10*3/uL (ref 0.0–0.7)
Eosinophils Relative: 0 % (ref 0–5)
Lymphs Abs: 0.7 10*3/uL (ref 0.7–4.0)

## 2010-05-05 ENCOUNTER — Other Ambulatory Visit: Payer: Self-pay | Admitting: Pulmonary Disease

## 2010-06-02 NOTE — Op Note (Signed)
NAME:  David Howard, David Howard NO.:  [id number]   MEDICAL RECORD NO.:  [id number]          PATIENT TYPE:  OIB   LOCATION:  1536                         FACILITY:  [facility]   PHYSICIAN:  Adolph Pollack, M.D.DATE OF BIRTH:  1923/10/13   DATE OF PROCEDURE:  02/16/2008  DATE OF DISCHARGE:                               OPERATIVE REPORT   PREOPERATIVE DIAGNOSIS:  1. Terminal ileitis.  2. Recently incarcerated right inguinal hernia.   POSTOPERATIVE DIAGNOSIS:  Intermittently incarcerated right inguinal  hernia leading to intermittent partial small bowel obstructions.   PROCEDURE:  1. Diagnostic laparoscopy and reduction of the right inguinal hernia      (contained terminal ileum and cecum).  2. Right inguinal hernia repair with mesh.   SURGEON:  Adolph Pollack, M.D.   ASSISTANT:  Anselm Pancoast. Zachery Dakins, M.D.   ANESTHESIA:  General.   INDICATION:  This is an 77 year old male who for a number of weeks now  has had intermittent episodes of sharp periumbilical abdominal pain and  more recently nausea and vomiting with difficulty keeping anything down.  He has had evaluation and what was noted on a couple of CT scans with  inflammation of the terminal ileum suspicious for Crohn's disease,  infectious etiology or potential lymphoma.  He has been started on  steroids with no relief.  He had been examined a number of times and he  always had a very simple hernia that reduces in supine position.  None  of the CT scans demonstrated the hernia, they only demonstrated a little  fluid in it, no bowel.  Today on examination he has a large right  inguinal hernia that is not reducible.  He is now admitted for the  above, for laparoscopy.   TECHNIQUE:  He is seen in the operating room, placed supine on the  operating table and general anesthetic was administered.  A Foley  catheter was placed in the bladder.  I did not attempt to reduce the  hernia.  The hair on the abdominal wall  was clipped and the abdominal  wall and groin were sterilely prepped and draped.  A small incision was  made in the left upper quadrant using a 5 mm OptiView trocar access  gained into the peritoneal cavity.  A pneumoperitoneum was then created.   The laparoscope was introduced and I reviewed the area directly under  the trocar and there was no evidence of bleeding or organ injury.  On  viewing the right lower quadrant I noted that he had distal ileum and  cecum in the right inguinal hernia and this could not be reduced with  external compression.  I then placed a 5 mm trocar in the left mid  lateral abdomen and using a bowel clamp was able to reduce the hernia.  What appeared was this there appeared to be thickened terminal ileum but  no evidence of Crohn's disease or any infiltrating process.  The bowel  proximal to this was mildly dilated.  It appeared that he was having  intermittent incarcerations of his hernia which led to intermittent  chronic thickening  of the bowel and thus his symptoms.   The small bowel was run proximal and distal to this and the cecum was  identified.  No other abnormalities were noted.  At this point I did not  think he needed a small bowel biopsy or a small bowel resection.  This  appeared to be due to a chronically intermittently incarcerated right  inguinal hernia despite never showing up on a previous physical exam  until today or on multiple CT scans.  Thus I decided to go ahead and  repair the hernia and inspect the bowel through that incision.   A right inguinal incision was then made after infiltration of Marcaine  to the skin and subcutaneous tissue down to the level of external  oblique aponeurosis.  Marcaine was then infiltrated deep to the external  oblique aponeurosis and external oblique aponeurosis was incised through  the external ring medially and up toward the anterior superior iliac  spine laterally.  The ilioinguinal nerve was  anesthetized and preserved.  The spermatic cord was isolated and a window created around it  posteriorly.  A large hernia sac was noted as well as some  extraperitoneal fat out through a patulous internal ring.  Using careful  dissection I identified the sac and separated it from the spermatic cord  contents.  I also excised the extraperitoneal fat ligating with 2-0  Vicryl suture at the level of the internal ring and sending it to  pathology as part of contents of hernia.  Once I had separated the sac  from the cord structures I opened up the sac.  I brought up the terminal  ileum and cecum through the sac just slightly enlarging the internal  ring laterally.  It appeared to be somewhat thickened but not obstructed  and there was no evidence of Crohn's disease or infiltrating process.  This area was patent.  I did not see any evidence of necrosis or reason  to perform a bowel resection.  Thus I placed this back into the  peritoneal cavity.  I then excised excess sac and closing it with  running 2-0 Vicryl, 2-0 silk suture.   Following this I irrigated out the wound.  I then brought a piece of 3 x  6 inch polypropylene mesh into the field and anchored it 1-2 cm medial  to the pubic tubercle with 2-0 Prolene suture.  The inferior aspect of  the mesh was anchored to the shelving edge of the inguinal ligament with  running 2-0 Prolene suture up to the level approximately 2-3 cm lateral  to the internal ring.  Following this I cut a slit in the mesh creating  two tails and these were wrapped around the spermatic cord.  The  superior aspect of the mesh was anchored to the internal oblique  aponeurosis with interrupted 2-0 Vicryl sutures.  I then crossed the two  tails creating a new internal ring and these were anchored to the  shelving edge of the inguinal ligament with a 2-0 Prolene suture.  The  excess lateral mesh was cut and then tucked deep to the external oblique  aponeurosis.  I  inspected the wound and reirrigated it and hemostasis  was adequate.  The external oblique aponeurosis was then closed over the  mesh and cord with a running 3-0 Vicryl suture.  Scarpa's fascia was  closed with a running 3-0 Vicryl suture.   I reinsufflated the abdomen.  I again inspected the distal small bowel.  I evacuated some of the fluid that had been in the hernia sac by way of  suction and then released pneumoperitoneum and removed the trocars.  The  trocar site skin  incisions were closed with 4-0 Monocryl stitches.  Then the right groin  incision site was closed with staples.  Sterile dressings were applied.  The right testicle was in the scrotum.  He tolerated the procedures well  without any apparent complications and was taken to the recovery room in  satisfactory condition.      Adolph Pollack, M.D.  Electronically Signed     TJR/MEDQ  D:  02/16/2008  T:  02/17/2008  Job:  [id number]   cc:   Venita Lick. Russella Dar, MD, FACG  [address]  [place]  [place] [address]   Lonzo Cloud. Kriste Basque, MD  [address]  [place]  [place] [address]

## 2010-06-02 NOTE — H&P (Signed)
NAME:  David Howard, David Howard NO.:  [id number]   MEDICAL RECORD NO.:  [id number]          PATIENT TYPE:  OIB   LOCATION:  1536                         FACILITY:  [facility]   PHYSICIAN:  Adolph Pollack, M.D.DATE OF BIRTH:  18-Dec-1923   DATE OF ADMISSION:  02/16/2008  DATE OF DISCHARGE:                              HISTORY & PHYSICAL   REASON FOR ADMISSION:  Elective surgery.   HISTORY:  This 77 year old male has been having intermittent episodes of  sharp periumbilical type pains and bloating, initially no nausea and  vomiting, but he has begun having more frequent episodes here lately  with nausea and vomiting.  He has had some constipation.  He has had a  noted right inguinal hernia but it has been a simple hernia and he  states it has not really caused him any problem.  He had a thorough  evaluation by Dr. Russella Dar and underwent a CT scan, which demonstrated  terminal ileitis.  Considerations included inflammatory bowel disease,  infectious process, or possibly lymphoma.  He was placed on steroids.  He saw Dr. Russella Dar, and at one point stated that when he had these  episodes of pain, he noticed his hernia would sometimes become  uncomfortable and enlarged at times, but whenever he has been examined  or on any CT scan, there has been no intestine in the hernia and the  hernia has been easily reducible.  In fact, his initial examination at  my office demonstrated a simple inguinal hernia that was reducible when  he went into the supine position.  There is concern about this terminal  ileitis and the potential diagnosis for it and thus he presents for a  diagnostic laparoscopy, possible small bowel biopsy, or small bowel  resection.  He states his steroids have not helped his discomfort.   PAST MEDICAL HISTORY:  1. Hypertension.  2. Hypercholesterolemia.  3. Gastroesophageal reflux disease.  4. Diverticulosis.  5. BPH.  6. History of bladder tumors.  7. 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  8. Spinal stenosis.  9. Chronic back pain.   PREVIOUS OPERATIONS:  1. TURBT.  2. Excision of sebaceous cyst from right buttock.  3. Pilonidal cystectomy.  4. Tonsillectomy.  5. Cataract surgery.  He has not had any previous abdominal operations.   ALLERGIES:  No known drug allergies.   CURRENT MEDICATIONS:  Include lisinopril/hydrochlorothiazide, terazosin,  finasteride, prednisone, cyclobenzaprine, 81 mg aspirin, saw palmetto,  calcium with D, vitamin B3, selenium, and cyproheptadine.   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  Rare  alcohol use.  No tobacco use.   REVIEW OF SYSTEMS:  He has had some significant weight loss over these  past number of weeks given this problem.   PHYSICAL EXAMINATION:  GENERAL:  A well-developed and well-nourished  male.  He is in no acute distress, pleasant and cooperative.  VITAL SIGNS:  Temperature 96.9, blood pressure 136/89, pulse 83, and O2  saturation is 93% on room air.  NECK:  Supple without masses.  RESPIRATORY:  Breath sounds are equal and clear.  Respirations are  unlabored.  CARDIOVASCULAR:  Regular rate and regular rhythm.  No murmur.  ABDOMEN:  Soft.  It is nontender.  It is scaphoid and nondistended.  GU:  There is a right inguinal bulge in the supine position.  This is  large and not reducible - this is the first time I have seen this.  EXTREMITIES:  SCDs on.   IMPRESSION:  1. Terminal ileitis.  2. Currently incarcerated right inguinal hernia.  Abdomen is scaphoid.   PLAN:  Diagnostic laparoscopy with possible small bowel resection or  biopsy.  Very likely, he will need a right inguinal hernia repair as  well.      Adolph Pollack, M.D.  Electronically Signed     TJR/MEDQ  D:  02/16/2008  T:  02/17/2008  Job:  [id number]

## 2010-06-02 NOTE — Assessment & Plan Note (Signed)
[facility]                         GASTROENTEROLOGY OFFICE NOTE   CHADEN, DOOM                       MRN:          [id number]  DATE:10/14/2006                            DOB:          [birth year]    CHIEF COMPLAINT:  This is an 77 year old white male returning for follow  up with a history of colon polyps.   HISTORY OF PRESENT ILLNESS:  Mr. Trombetta has been followed by Dr. Victorino Dike for many years and carries a diagnosis of adenomatous colon  polyps. He has had polyps removed on multiple occasions beginning in  approximately 1993. The only pathology report that I can locate on the  chart is from his most recent colonoscopy which was in September 2005  and that showed hyperplastic polyps. The patient relates occasional  problems with rare episodes of heartburn that are promptly relieved with  the p.r.n. use of Zantac or Pepcid. He has no dysphagia or odynophagia.  He relates no abdominal pain, rectal pain, change in bowel habits,  change in stool caliber, diarrhea, constipation, melena or hematochezia.  There is no family history of colon cancer, colon polyps, or  inflammatory bowel disease.   PAST MEDICAL HISTORY:  1. Hypertension.  2. Adenomatous colon polyps  3. 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  4. History of superficial bladder cancer.  5. Benign prostatic hypertrophy.  6. Low back pain.   CURRENT MEDICATIONS:  Listed on the chart - updated and reviewed.   SOCIAL HISTORY:  He is married with two children. He drinks about one  glass of wine a month. He denies any tobacco product usage. 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   REVIEW OF SYSTEMS:  Remarkable for chronic back pain. All of the  remaining review of systems were negative.   PHYSICAL EXAMINATION:  GENERAL:  Well developed, overweight white male  in no acute distress.  VITAL SIGNS:  Height 5 feet 9 inches, weight 202.4 pounds, blood  pressure 124/70, pulse 80 and regular.  HEENT:  Anicteric sclerae, oropharynx is clear.  CHEST:  Clear to auscultation bilaterally.  CARDIAC:  Regular rate and rhythm without murmurs appreciated.  ABDOMEN:  Soft, nontender, nondistended, normal active bowel sounds, no  palpable organomegaly, masses, or hernias.   ASSESSMENT AND PLAN:  1. Personal history of adenomatous colon polyps. Risks, benefits, and      alternatives to colonoscopy, possible biopsy, and possible      polypectomy discussed with the patient and he consents to proceed.      This will be scheduled electively.  2. Diverticulosis and internal hemorrhoids. He is recommended to      remain on a long term high fiber diet with adequate fluid intake.      He may use over-the-counter Anusol suppositories and cream as      needed for hemorrhoidal symptoms.     Venita Lick. Russella Dar, MD, [facility]  Electronically Signed    MTS/MedQ  DD: 10/14/2006  DT: 10/15/2006  Job #: [id number]

## 2010-07-03 ENCOUNTER — Encounter (HOSPITAL_BASED_OUTPATIENT_CLINIC_OR_DEPARTMENT_OTHER): Payer: Medicare Other | Admitting: Oncology

## 2010-07-03 DIAGNOSIS — C8589 Other specified types of non-Hodgkin lymphoma, extranodal and solid organ sites: Secondary | ICD-10-CM

## 2010-07-09 ENCOUNTER — Telehealth: Payer: Self-pay | Admitting: Pulmonary Disease

## 2010-07-09 DIAGNOSIS — D126 Benign neoplasm of colon, unspecified: Secondary | ICD-10-CM

## 2010-07-09 DIAGNOSIS — I10 Essential (primary) hypertension: Secondary | ICD-10-CM

## 2010-07-09 DIAGNOSIS — R933 Abnormal findings on diagnostic imaging of other parts of digestive tract: Secondary | ICD-10-CM

## 2010-07-09 DIAGNOSIS — K219 Gastro-esophageal reflux disease without esophagitis: Secondary | ICD-10-CM

## 2010-07-09 DIAGNOSIS — R7309 Other abnormal glucose: Secondary | ICD-10-CM

## 2010-07-09 NOTE — Telephone Encounter (Signed)
Pt aware labs are in the computer. He verbalized understanding of this and voiced no further concerns at this time.

## 2010-07-09 NOTE — Telephone Encounter (Signed)
Labs are in the computer for the pt thanks 

## 2010-07-13 ENCOUNTER — Other Ambulatory Visit (INDEPENDENT_AMBULATORY_CARE_PROVIDER_SITE_OTHER): Payer: Medicare Other

## 2010-07-13 DIAGNOSIS — K219 Gastro-esophageal reflux disease without esophagitis: Secondary | ICD-10-CM

## 2010-07-13 DIAGNOSIS — R933 Abnormal findings on diagnostic imaging of other parts of digestive tract: Secondary | ICD-10-CM

## 2010-07-13 DIAGNOSIS — I10 Essential (primary) hypertension: Secondary | ICD-10-CM

## 2010-07-13 DIAGNOSIS — D126 Benign neoplasm of colon, unspecified: Secondary | ICD-10-CM

## 2010-07-13 DIAGNOSIS — R7309 Other abnormal glucose: Secondary | ICD-10-CM

## 2010-07-13 LAB — BASIC METABOLIC PANEL
BUN: 17 mg/dL (ref 6–23)
Chloride: 98 mEq/L (ref 96–112)
Potassium: 3.7 mEq/L (ref 3.5–5.1)
Sodium: 135 mEq/L (ref 135–145)

## 2010-07-13 LAB — HEPATIC FUNCTION PANEL
ALT: 19 U/L (ref 0–53)
AST: 23 U/L (ref 0–37)
Alkaline Phosphatase: 42 U/L (ref 39–117)
Bilirubin, Direct: 0.2 mg/dL (ref 0.0–0.3)
Total Bilirubin: 1 mg/dL (ref 0.3–1.2)

## 2010-07-13 LAB — LIPID PANEL
Cholesterol: 174 mg/dL (ref 0–200)
LDL Cholesterol: 91 mg/dL (ref 0–99)

## 2010-07-13 LAB — CBC WITH DIFFERENTIAL/PLATELET
Eosinophils Absolute: 0.4 10*3/uL (ref 0.0–0.7)
HCT: 39.2 % (ref 39.0–52.0)
Lymphs Abs: 1.5 10*3/uL (ref 0.7–4.0)
MCHC: 34 g/dL (ref 30.0–36.0)
MCV: 90 fl (ref 78.0–100.0)
Monocytes Absolute: 0.7 10*3/uL (ref 0.1–1.0)
Neutrophils Relative %: 66.6 % (ref 43.0–77.0)
Platelets: 169 10*3/uL (ref 150.0–400.0)
RDW: 13.7 % (ref 11.5–14.6)

## 2010-07-13 LAB — TSH: TSH: 1.13 u[IU]/mL (ref 0.35–5.50)

## 2010-07-15 ENCOUNTER — Encounter: Payer: Self-pay | Admitting: Pulmonary Disease

## 2010-07-17 ENCOUNTER — Encounter: Payer: Self-pay | Admitting: Pulmonary Disease

## 2010-07-17 ENCOUNTER — Ambulatory Visit (INDEPENDENT_AMBULATORY_CARE_PROVIDER_SITE_OTHER): Payer: Medicare Other | Admitting: Pulmonary Disease

## 2010-07-17 DIAGNOSIS — M545 Low back pain: Secondary | ICD-10-CM

## 2010-07-17 DIAGNOSIS — R7309 Other abnormal glucose: Secondary | ICD-10-CM

## 2010-07-17 DIAGNOSIS — C679 Malignant neoplasm of bladder, unspecified: Secondary | ICD-10-CM

## 2010-07-17 DIAGNOSIS — Z87898 Personal history of other specified conditions: Secondary | ICD-10-CM

## 2010-07-17 DIAGNOSIS — D126 Benign neoplasm of colon, unspecified: Secondary | ICD-10-CM

## 2010-07-17 DIAGNOSIS — I10 Essential (primary) hypertension: Secondary | ICD-10-CM

## 2010-07-17 DIAGNOSIS — K5730 Diverticulosis of large intestine without perforation or abscess without bleeding: Secondary | ICD-10-CM

## 2010-07-17 DIAGNOSIS — K219 Gastro-esophageal reflux disease without esophagitis: Secondary | ICD-10-CM

## 2010-07-17 DIAGNOSIS — R972 Elevated prostate specific antigen [PSA]: Secondary | ICD-10-CM

## 2010-07-17 DIAGNOSIS — I739 Peripheral vascular disease, unspecified: Secondary | ICD-10-CM

## 2010-07-17 DIAGNOSIS — M4800 Spinal stenosis, site unspecified: Secondary | ICD-10-CM

## 2010-07-27 ENCOUNTER — Encounter: Payer: Self-pay | Admitting: Pulmonary Disease

## 2010-07-27 NOTE — Patient Instructions (Signed)
Today we updated your med list in EPIC...    Continue your current meds the same...  We reviewed your recently done fasting blood work & everything looks good...  Call for any questions...  Let's plan a follow up physical in 1 year, but call sooner if needed for problems.Marland KitchenMarland Kitchen

## 2010-07-27 NOTE — Progress Notes (Signed)
Subjective:    Patient ID: David Howard, male    DOB: February 01, 1923, 77 y.o.   MRN: [id number]  HPI 77 y/o WM here for a follow up visit... he has multiple medical problems as noted below...   ~  Dec09:  episode of severe abd pain lasting 14hrs... the pain was mid-abdominal and ?assoc w/ bloating/ swelling, but he denies N/ V/ RUQ pain or tender, no D/ C/ change in BM's, blood etc... he noted a bulge in right groin 5-6 months ago and immed saw DrSherrill who Dx a RIH... had Urology f/u w/ DrBorden who concurred w/ RIH dx, but it was non-tender and easily reduced so they rec conservative approach- watch it... pain is gone at present and his exam neg- except for [facility] that easily reduces when supine... ~  saw GI w/ persistant/ intermittent abd pain... colonoscopy 01/16/08 by DrStark w/ divertics, ?ileitis of terminal ileum?, hems... 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 bx of term ileum without inflamm, & clinically no better on Pred Rx, therefore sent to surg for Lap... ~  had Dx laparascopy 02/16/08 by Clyde Lundborg w/ finding of RIH containing terminal ileum & cecum w/ part obstruction- hernia repaired w/ mesh... ~  Feb10:  Improved overall but still weak and mult concerns about his evaluation- all discussed w/ pt & wife today in the office... lab work done 2/15 & looks OK.  ~  July 05, 2008:  stable- doing satis, labs 07/02/08 reviewed w/ pt... he saw DrRamos 3/10 for f/u pain management/ sp stenosis L4-5: prev Gabitril stopped working for him, offered sp cord stim/ selective nerve root blocks (holding off unless absolutely needed), on LYRICA now 150mg Qhs w/ mild benefit... he saw Urology, DrBorden 3/10 for f/u bladder Ca, BPH, elevated PSA, ED... neg cystoscopy w/o recurrent bladder tumors, incr PSA velocity to 3.21 on Finasteride (continue surveillance for now), on Terazosin + Finasteride  Rx for BPH... he saw DrWainer recently w/ cortisone shot in left shoulder- sl better now... he saw DrSherrill last week for f/u cutaneous lymphoma- doing well, no recurrence...  ~  July 11, 2009:  yearly ROV- stable w/ severe pain from sp stenosis right buttock & leg> DrRamos Rx w/ Lyrica 150mg Qhs & he has good days & bad... he had bronchitic infection 3/11- resolved w/ ZPak & Pred... BP controlled on meds;  DM borderline- doing satis on diet alone;  GI is stable;  GU followed by DrBorden- had cysto 4/11= neg & he felt they could stop the cystoscopic surveilance, but he wants to continue PSA's Q36mo... f/u fasting labs last week reviewed w/ pt- doing well, same meds.  ~  July 17, 2010:  Yearly ROV & David Howard has a list of concerns- all addressed w/ pt> on Calcium Bid w/ Vit D (OK); on ASA 81mg  Qod & asked to incr to daily if he can; dry throat- makes him cough & lozenges help; good report from DrSherrill, check spot on back- all look like SK's & cherry hemangiomas to me...    He saw DrBorden last 10/11 for f/u papillary bladder cancer 2005 w/o recurrence & last cysto 4/11 was neg (he doesn't feel need for any further surveillance cystos); Hx incr PSA- improved w/ Proscar + Hytrin rx; BPH w/ LTOS- improved on RX, & ED;  Doing well, no change in therapy...    He saw DrWainer 4/12 for knee bursitis, Cx & Lumbar DDD> given  knee injection w/ improvement...    He saw DrSherrill 6/12 for f/u of his primary B-cell lymphoma, s/p XRT in 2003, w/o evid of recurrence...    LABS 6/12 including FLP, CBC, Chems, TSH> all look good...   Problem List:    his last Tetanus shot=2002, last Pneumovax=1998, had Shingles vaccine 10/10, gets yearly Flu shots...   HYPERTENSION (ICD-401.9) - on LISINOPRIL/ HCT 20-12.5 daily & HYTRIN 5mg /d... BP= 126/80 today- feeling well... home BP checks ave 120-130/ 70's... denies HA, fatigue, visual changes, CP, palipit, dizziness, syncope, dyspnea, edema, etc...  PERIPHERAL VASCULAR DISEASE  (ICD-443.9) - on ASA 81mg  taking Qod & asked to incr to daily... CT Abd 12/09 showed left common iliac art aneurysm= 2.1cm amoung other things...   DIABETES MELLITUS, BORDERLINE (ICD-790.29) - on diet alone and his weight is stable at ~200#... we discussed diet therapy & try to find some type of exercise that doesn't hurt his back to aid wt reduction. ~  labs over the last 2 yrs showed BS's 114 - 123 ~  labs 07/10/07 showed FBS= 115... continue diet efforts (it's hard for him to exerc due to back pain).  ~  Chol values are good on diet alone- TChol 173, TG 55, HDL 75, LDL 87 ~  BS 2/10 = 98... rec- continue diet Rx. ~  labs 6/10 showed BS=109, TChol 179, TG 50, HDL 81, LDL 88 ~  labs 6/11 showed BS=107, TChol 186, TG 78, HDL 78, LDL 93 ~  Labs 6/12 showed BS= 104, TChol 174, TG 60, HDL 71, LDL 91  GERD (ICD-530.81) - mild symptoms, uses OTC meds (Zanrtac) as needed.  DIVERTICULOSIS, COLON (ICD-562.10), INTERNAL HEMORRHOIDS (ICD-455.0),  COLON POLYP (ICD-211.3) ~  colonoscopy 10/08 by DrStark showed divertics, hems, sm 4mm polyp= tubular adenoma... ~  colonoscopy repeated 12/09 as above w/ divertics, ?ileitis of terminal ileum?, hems...  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 bx of term ileum without inflamm, & clinically no better on Pred Rx, therefore sent to surg for Lap= RIH w/ cecum & TI. ~  He uses Metamucil daily & Miralax prn...  BENIGN PROSTATIC HYPERTROPHY, HX OF (ICD-V13.8) - on TERAZOSIN 5mg /d + FINASTERIDE 5mg /d... ELEVATED PROSTATE SPECIFIC ANTIGEN (ICD-790.93) - followed by DrBorden... Hx of BLADDER CANCER (ICD-188.9) - all followed by DrHumphries & now DrBorden- seen 9/09 w/ Dx of Bladder Tumor (papillary neoplasm), s/p TURBT 6/05 & prev had surveillance cysto's yearly (all were neg & last one done 4/11);  Hx elevated PSA's that decreased appropriately on  Proscar;  BPH w/ nocturia;  ED... ~  seen 3/10 w/ neg cystoscopy, increased PSA velocity on Finasteride (PSA 3.21), & BPH symptoms stable. ~  seen 4/11 w/ neg cysto & decision made to stop surveillance cystos, he wants to continue Q76mo PSA's etc...  BACK PAIN, LUMBAR (ICD-724.2) - severely limited in exercise due to LBP... Prev on Lyrica 150mg Qhs; still uses FLEXERIL 10mg Tid Prn & OTC anti-inflamm rx. SPINAL STENOSIS (ICD-724.00) - followed by DrRamos (Pain), DrWainer (Ortho), DrNudelman (NS)...  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 he has been in clinical remission ever since then... followed Q38months by DrSherril... DrTurner is his Dermatologist...  DERM - he takes PERIACTIN 4mg BidPrn & TRIAMCINOLONE cream per DrTurner... he has several ecchymoses... he is concerned about his  81mg  ASA daily- he notes that his skin is very thin and he bruises easily "ugly damn marks appear w/ the slightest touch"... he wants to decr his ASA to every other day... OK.   Past Surgical History  Procedure Date  . Tonsillectomy   . Cataract extraction   . Laparoscopy   . Pilonidal cyst / sinus excision     Outpatient Encounter Prescriptions as of 07/17/2010  Medication Sig Dispense Refill  . aspirin 81 MG tablet Take 81 mg by mouth daily.        . Calcium Carbonate-Vitamin D (CALCIUM-VITAMIN D) 500-200 MG-UNIT per tablet Take 1 tablet by mouth daily.        . cyclobenzaprine (FLEXERIL) 10 MG tablet Take 10 mg by mouth 3 (three) times daily as needed.        . cyproheptadine (PERIACTIN) 4 MG tablet Take 4 mg by mouth 2 (two) times daily as needed.        . finasteride (PROSCAR) 5 MG tablet Take 5 mg by mouth daily.        Marland Kitchen lisinopril-hydrochlorothiazide (PRINZIDE,ZESTORETIC) 20-12.5 MG per tablet TAKE 1 TABLET DAILY.  30 tablet  11  . Multiple Vitamin (MULTIVITAMIN) capsule  Take 1 capsule by mouth daily.        Marland Kitchen terazosin (HYTRIN) 5 MG capsule TAKE (1) CAPSULE DAILY.  30 capsule  11  . triamcinolone (KENALOG) 0.5 % cream Apply 1 application topically 2 (two) times daily.        . pregabalin (LYRICA) 150 MG capsule Take 150 mg by mouth 2 (two) times daily.        . saw palmetto 160 MG capsule Take 160 mg by mouth 2 (two) times daily.        . Selenium 200 MCG TABS Take 1 tablet by mouth daily.          No Known Allergies   Current Medications, Allergies, Past Medical History, Past Surgical History, Family History, and Social History were reviewed in [employer] record.   Review of Systems        See HPI - all other systems neg except as noted... The patient complains of dyspnea on exertion and difficulty walking.  The patient denies anorexia, fever, weight loss, weight gain, vision loss, decreased hearing, hoarseness, chest pain, syncope, peripheral edema, prolonged cough, headaches, hemoptysis, abdominal pain, melena, hematochezia, severe indigestion/heartburn, hematuria, incontinence, muscle weakness, suspicious skin lesions, transient blindness, depression, unusual weight change, abnormal bleeding, enlarged lymph nodes, and angioedema.   Objective:   Physical Exam     WD, overweight, 77 y/o WM in NAD...  GENERAL:  Alert & oriented; pleasant & cooperative... HEENT:  [place]/AT, EOM-full, PERRLA, EACs-clear, TMs-wnl, NOSE-clear, THROAT-clear & wnl. NECK:  Supple w/ fairROM; no JVD; normal carotid impulses w/o bruits; no thyromegaly or nodules palpated; no lymphadenopathy. CHEST:  Coarse BS but clear w/o wheezing, rales, or rhonchi... HEART:  Regular Rhythm;  gr 1/6 SEM w/o rubs or gallops detected... ABDOMEN:  Soft & sl tender right groin- s/p RIH repair; normal bowel sounds; no organomegaly or masses palpated. EXT: without deformities, mod arthritic changes; no varicose veins/ +venous insuffic/ or edema. NEURO:  CN's intact;  no focal  neuro deficits...Marland Kitchenno facial droop DERM:  No lesions noted; +ecchymoses along hand/forearms   Assessment & Plan:   Yearly ROV>  Stable overall & labs are WNL...  HBP>  Controlled on Lisinopril & Hytrin, continue same...  ASPVD>  Continue ASA Rx & encouraged to  exercise...  Borderline DM>  BS remains normal; continue low carb wt reducing diet...  GI>  GERD, Divertics, Polyps, Hems>  Continue prn Zantac, and daily metamucil...  GU> followed by DrBorden> Hx papillary Ca of bladder- no recurrence, BPH, Hx elev PSA, ED> continue Finasteride & Terazosin...  DJD, LBP, Spinal Stenosis>  He maintains f/u DrWainer et al, using OTC analgesics prn...  B-cell Lymphoma> 2003 w/ cutaneous involvement, followed by DrSherrill & no evid recurrence after ~10 yrs, doing well.Marland KitchenMarland Kitchen

## 2010-07-29 ENCOUNTER — Telehealth: Payer: Self-pay | Admitting: Pulmonary Disease

## 2010-07-29 NOTE — Telephone Encounter (Signed)
Patient wants to discuss homeopathic medication with TP for bruising and soreness. Will forward to TP so she can speak with the patient.

## 2010-07-30 NOTE — Telephone Encounter (Signed)
Spoke with pt- he states TP called him and spoke with him regarding this already and so nothing further is needed.

## 2010-07-30 NOTE — Telephone Encounter (Signed)
Could not find any Evidence based research to support  It's use Also unclear if it inteacts with his meds , so I do not recommend.

## 2010-11-02 ENCOUNTER — Other Ambulatory Visit: Payer: Self-pay | Admitting: Pulmonary Disease

## 2010-12-03 ENCOUNTER — Other Ambulatory Visit: Payer: Self-pay | Admitting: Pulmonary Disease

## 2011-01-01 ENCOUNTER — Other Ambulatory Visit: Payer: Medicare Other | Admitting: Lab

## 2011-01-01 ENCOUNTER — Ambulatory Visit: Payer: Medicare Other | Admitting: Oncology

## 2011-02-01 ENCOUNTER — Other Ambulatory Visit: Payer: Medicare Other | Admitting: Lab

## 2011-02-01 ENCOUNTER — Ambulatory Visit: Payer: Medicare Other | Admitting: Oncology

## 2011-02-05 ENCOUNTER — Ambulatory Visit: Payer: Medicare Other | Admitting: Oncology

## 2011-02-12 ENCOUNTER — Other Ambulatory Visit: Payer: Medicare Other | Admitting: Lab

## 2011-02-12 ENCOUNTER — Ambulatory Visit: Payer: Medicare Other | Admitting: Oncology

## 2011-03-01 ENCOUNTER — Other Ambulatory Visit: Payer: Self-pay | Admitting: *Deleted

## 2011-03-01 ENCOUNTER — Telehealth: Payer: Self-pay | Admitting: Oncology

## 2011-03-01 ENCOUNTER — Ambulatory Visit (HOSPITAL_BASED_OUTPATIENT_CLINIC_OR_DEPARTMENT_OTHER): Payer: Medicare Other | Admitting: Oncology

## 2011-03-01 ENCOUNTER — Other Ambulatory Visit (HOSPITAL_BASED_OUTPATIENT_CLINIC_OR_DEPARTMENT_OTHER): Payer: Medicare Other

## 2011-03-01 VITALS — BP 138/82 | HR 95 | Temp 97.0°F | Ht 69.0 in | Wt 198.2 lb

## 2011-03-01 DIAGNOSIS — C8589 Other specified types of non-Hodgkin lymphoma, extranodal and solid organ sites: Secondary | ICD-10-CM

## 2011-03-01 LAB — CBC WITH DIFFERENTIAL/PLATELET
Basophils Absolute: 0 10*3/uL (ref 0.0–0.1)
Eosinophils Absolute: 0.1 10*3/uL (ref 0.0–0.5)
HCT: 40.3 % (ref 38.4–49.9)
HGB: 13.5 g/dL (ref 13.0–17.1)
LYMPH%: 12.4 % — ABNORMAL LOW (ref 14.0–49.0)
MCHC: 33.5 g/dL (ref 32.0–36.0)
MONO#: 0.4 10*3/uL (ref 0.1–0.9)
NEUT#: 8.4 10*3/uL — ABNORMAL HIGH (ref 1.5–6.5)
NEUT%: 81.8 % — ABNORMAL HIGH (ref 39.0–75.0)
Platelets: 179 10*3/uL (ref 140–400)
WBC: 10.3 10*3/uL (ref 4.0–10.3)
lymph#: 1.3 10*3/uL (ref 0.9–3.3)

## 2011-03-01 NOTE — Telephone Encounter (Signed)
gv pt appt for aug2013 °

## 2011-03-01 NOTE — Progress Notes (Signed)
OFFICE PROGRESS NOTE   INTERVAL HISTORY:   He returns as scheduled. He denies fever, night sweats, and anorexia. He continues to work part-time. There are no suspicious skin lesions or lymph nodes.  Objective:  Vital signs in last 24 hours:  Blood pressure 138/82, pulse 95, temperature 97 F (36.1 C), temperature source Oral, height 5\' 9"  (1.753 m), weight 198 lb 3.2 oz (89.903 kg).    HEENT: Neck without mass Lymphatics: No cervical, supraclavicular, axillary, or inguinal nodes Resp: Lungs clear bilaterally Cardio: Regular rate and rhythm GI: No hepatosplenomegaly Vascular: No leg edema  Skin: Multiple benign appearing moles over the trunk. Radiation sites at the right lower back and right lateral chest wall without evidence of recurrent tumor. Changes of senile purpura over the forearms.  Lab Results:  Lab Results  Component Value Date   WBC 10.3 03/01/2011   HGB 13.5 03/01/2011   HCT 40.3 03/01/2011   MCV 89.4 03/01/2011   PLT 179 03/01/2011      Medications: I have reviewed the patient's current medications.  Assessment/Plan: 1. Primary cutaneous B-cell lymphoma, status post primary radiation in 2003.  No clinical evidence for recurrent disease. 2. Chronic prominence at the right medial scapula, likely benign asymmetry of the back musculature. 3. History of benign prostatic hypertrophy and superficial bladder cancer, followed by Dr. Laverle Patter. 4. History of a right inguinal hernia, status post repair by Dr. Abbey Chatters.     Disposition:  He remains in clinical remission from the non-Hodgkin's lymphoma. He would like to continue every 6 month followup at the cancer Center. He will return for opposite visit in 6 months.   Lucile Shutters, MD  03/01/2011  2:20 PM

## 2011-05-04 ENCOUNTER — Other Ambulatory Visit: Payer: Self-pay | Admitting: Pulmonary Disease

## 2011-07-08 ENCOUNTER — Other Ambulatory Visit: Payer: Self-pay | Admitting: Pulmonary Disease

## 2011-07-08 ENCOUNTER — Telehealth: Payer: Self-pay | Admitting: Pulmonary Disease

## 2011-07-08 DIAGNOSIS — D126 Benign neoplasm of colon, unspecified: Secondary | ICD-10-CM

## 2011-07-08 DIAGNOSIS — K5730 Diverticulosis of large intestine without perforation or abscess without bleeding: Secondary | ICD-10-CM

## 2011-07-08 DIAGNOSIS — R7309 Other abnormal glucose: Secondary | ICD-10-CM

## 2011-07-08 DIAGNOSIS — R935 Abnormal findings on diagnostic imaging of other abdominal regions, including retroperitoneum: Secondary | ICD-10-CM

## 2011-07-08 DIAGNOSIS — R972 Elevated prostate specific antigen [PSA]: Secondary | ICD-10-CM

## 2011-07-08 DIAGNOSIS — Z87898 Personal history of other specified conditions: Secondary | ICD-10-CM

## 2011-07-08 DIAGNOSIS — E559 Vitamin D deficiency, unspecified: Secondary | ICD-10-CM

## 2011-07-08 DIAGNOSIS — F419 Anxiety disorder, unspecified: Secondary | ICD-10-CM

## 2011-07-08 DIAGNOSIS — I10 Essential (primary) hypertension: Secondary | ICD-10-CM

## 2011-07-08 DIAGNOSIS — K219 Gastro-esophageal reflux disease without esophagitis: Secondary | ICD-10-CM

## 2011-07-08 DIAGNOSIS — K648 Other hemorrhoids: Secondary | ICD-10-CM

## 2011-07-08 NOTE — Telephone Encounter (Signed)
Leigh, please advise on labs thanks! 

## 2011-07-08 NOTE — Telephone Encounter (Signed)
Called and spoke with pts wife and she is aware of labs in the computer and that the pt can come in anytime for these fasting labs prior to his appt.

## 2011-07-12 ENCOUNTER — Other Ambulatory Visit (INDEPENDENT_AMBULATORY_CARE_PROVIDER_SITE_OTHER): Payer: Medicare Other

## 2011-07-12 DIAGNOSIS — K5730 Diverticulosis of large intestine without perforation or abscess without bleeding: Secondary | ICD-10-CM

## 2011-07-12 DIAGNOSIS — I10 Essential (primary) hypertension: Secondary | ICD-10-CM

## 2011-07-12 DIAGNOSIS — E559 Vitamin D deficiency, unspecified: Secondary | ICD-10-CM

## 2011-07-12 DIAGNOSIS — F419 Anxiety disorder, unspecified: Secondary | ICD-10-CM

## 2011-07-12 DIAGNOSIS — R935 Abnormal findings on diagnostic imaging of other abdominal regions, including retroperitoneum: Secondary | ICD-10-CM

## 2011-07-12 DIAGNOSIS — K219 Gastro-esophageal reflux disease without esophagitis: Secondary | ICD-10-CM

## 2011-07-12 DIAGNOSIS — F411 Generalized anxiety disorder: Secondary | ICD-10-CM

## 2011-07-12 LAB — CBC WITH DIFFERENTIAL/PLATELET
Basophils Absolute: 0 10*3/uL (ref 0.0–0.1)
Basophils Relative: 0.3 % (ref 0.0–3.0)
Eosinophils Absolute: 0.5 10*3/uL (ref 0.0–0.7)
Hemoglobin: 13.4 g/dL (ref 13.0–17.0)
MCHC: 33.2 g/dL (ref 30.0–36.0)
MCV: 90.3 fl (ref 78.0–100.0)
Monocytes Absolute: 0.5 10*3/uL (ref 0.1–1.0)
Neutro Abs: 6 10*3/uL (ref 1.4–7.7)
RBC: 4.47 Mil/uL (ref 4.22–5.81)
RDW: 13.1 % (ref 11.5–14.6)

## 2011-07-12 LAB — BASIC METABOLIC PANEL
BUN: 23 mg/dL (ref 6–23)
Creatinine, Ser: 1 mg/dL (ref 0.4–1.5)
GFR: 78.01 mL/min (ref >60.00)
Glucose, Bld: 110 mg/dL — ABNORMAL HIGH (ref 70–99)

## 2011-07-12 LAB — HEPATIC FUNCTION PANEL
ALT: 15 U/L (ref 0–53)
Alkaline Phosphatase: 39 U/L (ref 39–117)
Bilirubin, Direct: 0.2 mg/dL (ref 0.0–0.3)
Total Bilirubin: 1 mg/dL (ref 0.3–1.2)

## 2011-07-12 LAB — LIPID PANEL
Cholesterol: 162 mg/dL (ref 0–200)
HDL: 69.6 mg/dL (ref >39.00)
LDL Cholesterol: 78 mg/dL (ref 0–99)
Triglycerides: 70 mg/dL (ref 0.0–149.0)
VLDL: 14 mg/dL (ref 0.0–40.0)

## 2011-07-16 ENCOUNTER — Ambulatory Visit (INDEPENDENT_AMBULATORY_CARE_PROVIDER_SITE_OTHER): Payer: Medicare Other | Admitting: Pulmonary Disease

## 2011-07-16 ENCOUNTER — Encounter: Payer: Self-pay | Admitting: Pulmonary Disease

## 2011-07-16 ENCOUNTER — Telehealth: Payer: Self-pay | Admitting: Pulmonary Disease

## 2011-07-16 VITALS — BP 112/72 | HR 85 | Temp 98.5°F | Ht 69.0 in | Wt 198.0 lb

## 2011-07-16 DIAGNOSIS — K4090 Unilateral inguinal hernia, without obstruction or gangrene, not specified as recurrent: Secondary | ICD-10-CM

## 2011-07-16 DIAGNOSIS — S12110A Anterior displaced Type II dens fracture, initial encounter for closed fracture: Secondary | ICD-10-CM | POA: Insufficient documentation

## 2011-07-16 DIAGNOSIS — K5730 Diverticulosis of large intestine without perforation or abscess without bleeding: Secondary | ICD-10-CM

## 2011-07-16 DIAGNOSIS — K219 Gastro-esophageal reflux disease without esophagitis: Secondary | ICD-10-CM

## 2011-07-16 DIAGNOSIS — M545 Low back pain: Secondary | ICD-10-CM

## 2011-07-16 DIAGNOSIS — I739 Peripheral vascular disease, unspecified: Secondary | ICD-10-CM

## 2011-07-16 DIAGNOSIS — R7309 Other abnormal glucose: Secondary | ICD-10-CM

## 2011-07-16 DIAGNOSIS — I10 Essential (primary) hypertension: Secondary | ICD-10-CM

## 2011-07-16 DIAGNOSIS — K5900 Constipation, unspecified: Secondary | ICD-10-CM

## 2011-07-16 DIAGNOSIS — Z87898 Personal history of other specified conditions: Secondary | ICD-10-CM

## 2011-07-16 DIAGNOSIS — M4800 Spinal stenosis, site unspecified: Secondary | ICD-10-CM

## 2011-07-16 DIAGNOSIS — C679 Malignant neoplasm of bladder, unspecified: Secondary | ICD-10-CM

## 2011-07-16 DIAGNOSIS — M542 Cervicalgia: Secondary | ICD-10-CM

## 2011-07-16 NOTE — Progress Notes (Signed)
Subjective:    Patient ID: David Howard, male    DOB: 1923/03/10, 78 y.o.   MRN: [id number]  HPI 78 y/o WM here for a follow up visit... he has multiple medical problems as noted below...   ~  Dec09:  episode of severe abd pain lasting 14hrs... the pain was mid-abdominal and ?assoc w/ bloating/ swelling, but he denies N/ V/ RUQ pain or tender, no D/ C/ change in BM's, blood etc... he noted a bulge in right groin 5-6 months ago and immed saw DrSherrill who Dx a RIH... had Urology f/u w/ DrBorden who concurred w/ RIH dx, but it was non-tender and easily reduced so they rec conservative approach- watch it... pain is gone at present and his exam neg- except for [facility] that easily reduces when supine... ~  saw GI w/ persistant/ intermittent abd pain... colonoscopy 01/16/08 by DrStark w/ divertics, ?ileitis of terminal ileum?, hems... 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 bx of term ileum without inflamm, & clinically no better on Pred Rx, therefore sent to surg for Lap... ~  had Dx laparascopy 02/16/08 by Clyde Lundborg w/ finding of RIH containing terminal ileum & cecum w/ part obstruction- hernia repaired w/ mesh... ~  Feb10:  Improved overall but still weak and mult concerns about his evaluation- all discussed w/ pt & wife today in the office... lab work done 2/15 & looks OK.  ~  July 05, 2008:  stable- doing satis, labs 07/02/08 reviewed w/ pt... he saw DrRamos 3/10 for f/u pain management/ sp stenosis L4-5: prev Gabitril stopped working for him, offered sp cord stim/ selective nerve root blocks (holding off unless absolutely needed), on LYRICA now 150mg Qhs w/ mild benefit... he saw Urology, DrBorden 3/10 for f/u bladder Ca, BPH, elevated PSA, ED... neg cystoscopy w/o recurrent bladder tumors, incr PSA velocity to 3.21 on Finasteride (continue surveillance for now), on Terazosin + Finasteride  Rx for BPH... he saw DrWainer recently w/ cortisone shot in left shoulder- sl better now... he saw DrSherrill last week for f/u cutaneous lymphoma- doing well, no recurrence...  ~  July 11, 2009:  yearly ROV- stable w/ severe pain from sp stenosis right buttock & leg> DrRamos Rx w/ Lyrica 150mg Qhs & he has good days & bad... he had bronchitic infection 3/11- resolved w/ ZPak & Pred... BP controlled on meds;  DM borderline- doing satis on diet alone;  GI is stable;  GU followed by DrBorden- had cysto 4/11= neg & he felt they could stop the cystoscopic surveilance, but he wants to continue PSA's Q30mo... f/u fasting labs last week reviewed w/ pt- doing well, same meds.  ~  July 17, 2010:  Yearly ROV & David Howard has a list of concerns- all addressed w/ pt> on Calcium Bid w/ Vit D (OK); on ASA 81mg  Qod & asked to incr to daily if he can; dry throat- makes him cough & lozenges help; good report from DrSherrill, check spot on back- all look like SK's & cherry hemangiomas to me...    He saw DrBorden last 10/11 for f/u papillary bladder cancer 2005 w/o recurrence & last cysto 4/11 was neg (he doesn't feel need for any further surveillance cystos); Hx incr PSA- improved w/ Proscar + Hytrin rx; BPH w/ LTOS- improved on RX, & ED;  Doing well, no change in therapy...    He saw DrWainer 4/12 for knee bursitis, Cx & Lumbar DDD> given  knee injection w/ improvement...    He saw DrSherrill 6/12 for f/u of his primary B-cell lymphoma, s/p XRT in 2003, w/o evid of recurrence...    LABS 6/12 including FLP, CBC, Chems, TSH> all look good...  ~  July 16, 2011:  60yr ROV & David Howard is doing reasonably well but has considerable chr difficulty w/ his neck & back pain> he has been seen by DrWainer, Bonne Dolores, DrNudelman in the past; he had shots but the last one was yrs ago; he only takes an occas Tylenol, Advil, Aleve, & has Flexeril 10mg  for prn use... He saw DrWainer 4/13 w/ right knee pain- felt to be bursitis, given injection w/ some  relief...    He saw DrBorden for Urology 1/13> f/u bladder tumor w/ TURBT 2005, elevPSA, BPH/LTOS, & ED- papillary neoplasm of uncertain neoplastic potential removed from his bladder in 2005, he had surveillance cystos for 29yrs- no recurrence & now he is checked once yearly; also has hx elev PSAs & he insists on continued PSA screening- and it was ok per pt; he remains on Finasteride5mg  & Terazosin5mg  daily w/ mod LUTS but is managing very well...    He saw DrSherrill 2/13 & Dr. Donzetta Starch for Derm 3/13> hx cutaneous B-cell lymphoma, treated w/ XRT 2003, skin exam was neg- no lesions; he sees Oncology Q84mo & Derm yearly... We reviewed prob list, meds, xrays and labs> see below>> LABS 6/13:  FLP- at goals on diet alone;  Chems- wnl;  CBC- wnl;  TSH=1.17;  VitD=40...   Problem List:    his last Tetanus shot=2002, last Pneumovax=1998, had Shingles vaccine 10/10, gets yearly Flu shots...   HYPERTENSION (ICD-401.9) - on LISINOPRIL/ HCT 20-12.5 daily & HYTRIN 5mg /d... ~  CXR 6/09 showed stable heart size, sl elev left hemidiaph, scoliosis, clear lungs, NAD.Marland Kitchen. ~  CXR 1/12 showed stable, no change... ~  6/12:  BP= 126/80 today- feeling well; home BP checks ave 120-130/ 70's; denies HA, fatigue, visual changes, CP, palipit, dizziness, syncope, dyspnea, edema, etc... ~  6/13:  BP= 112/72 & he remains largely asymptomatic...  PERIPHERAL VASCULAR DISEASE (ICD-443.9) - on ASA 81mg  taking Qod & asked to incr to daily... CT Abd 12/09 showed left common iliac art aneurysm= 2.1cm amoung other things...   DIABETES MELLITUS, BORDERLINE (ICD-790.29) - on diet alone and his weight is stable at ~200#... we discussed diet therapy & try to find some type of exercise that doesn't hurt his back to aid wt reduction. ~  labs over the last 2 yrs showed BS's 114 - 123 ~  labs 07/10/07 showed FBS= 115... continue diet efforts (it's hard for him to exerc due to back pain).  ~  Chol values are good on diet alone- TChol 173, TG  55, HDL 75, LDL 87 ~  BS 2/10 = 98... rec- continue diet Rx. ~  labs 6/10 showed BS=109, TChol 179, TG 50, HDL 81, LDL 88 ~  labs 6/11 showed BS=107, TChol 186, TG 78, HDL 78, LDL 93 ~  Labs 6/12 showed BS= 104, TChol 174, TG 60, HDL 71, LDL 91 ~  Labs 6/13 showed BS= 110, TChol 162, TG 70, HDL 70, LDL 78  GERD (ICD-530.81) - mild symptoms, uses OTC meds (Zanrtac) as needed.  DIVERTICULOSIS, COLON (ICD-562.10), INTERNAL HEMORRHOIDS (ICD-455.0),  COLON POLYP (ICD-211.3) ~  colonoscopy 10/08 by DrStark showed divertics, hems, sm 4mm polyp= tubular adenoma... ~  colonoscopy repeated 12/09 as above w/ divertics, ?ileitis of terminal ileum?, hems...  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 bx of term ileum without inflamm, & clinically no better on Pred Rx, therefore sent to surg for Lap= RIH w/ cecum & TI. ~  He uses Metamucil daily & Miralax prn...  BENIGN PROSTATIC HYPERTROPHY, HX OF (ICD-V13.8) - on TERAZOSIN 5mg /d + FINASTERIDE 5mg /d... ELEVATED PROSTATE SPECIFIC ANTIGEN (ICD-790.93) - followed by DrBorden... Hx of BLADDER CANCER (ICD-188.9) - all followed by DrHumphries & now DrBorden- seen 9/09 w/ Dx of Bladder Tumor (papillary neoplasm), s/p TURBT 6/05 & prev had surveillance cysto's yearly (all were neg & last one done 4/11);  Hx elevated PSA's that decreased appropriately on Proscar;  BPH w/ nocturia;  ED... ~  seen 3/10 w/ neg cystoscopy, increased PSA velocity on Finasteride (PSA 3.21), & BPH symptoms stable. ~  seen 4/11 w/ neg cysto & decision made to stop surveillance cystos, he wants to continue Q57mo PSA's etc...  BACK PAIN, LUMBAR (ICD-724.2) - severely limited in exercise due to LBP... Prev on Lyrica 150mg Qhs; still uses FLEXERIL 10mg Tid Prn & OTC anti-inflamm rx. SPINAL STENOSIS (ICD-724.00) - followed by DrRamos (Pain), DrWainer (Ortho), DrNudelman  (NS)...  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 he has been in clinical remission ever since then... followed Q47months by DrSherril... DrTurner is his Dermatologist...  DERM - he takes PERIACTIN 4mg BidPrn & TRIAMCINOLONE cream per DrTurner... he has several ecchymoses... he is concerned about his 81mg  ASA daily- he notes that his skin is very thin and he bruises easily "ugly damn marks appear w/ the slightest touch"... he wants to decr his ASA to every other day... OK.   Past Surgical History  Procedure Date  . Tonsillectomy   . Cataract extraction   . Laparoscopy   . Pilonidal cyst / sinus excision     Outpatient Encounter Prescriptions as of 07/16/2011  Medication Sig Dispense Refill  . aspirin 81 MG tablet Take 81 mg by mouth daily.        . Calcium Carbonate-Vitamin D (CALCIUM-VITAMIN D) 500-200 MG-UNIT per tablet Take 1 tablet by mouth 2 (two) times daily with a meal.       . Cholecalciferol (VITAMIN D3) 1000 UNITS CAPS Take 1 capsule by mouth daily.      . cyproheptadine (PERIACTIN) 4 MG tablet TAKE AS DIRECTED.  30 tablet  11  . finasteride (PROSCAR) 5 MG tablet Take 5 mg by mouth daily.        Marland Kitchen FLEXERIL 10 MG tablet TAKE 1/2 TO 1 TAB 3 TIMES A DAY AS NEEDED FOR MUSCLE SPASM.  100 each  5  . lisinopril-hydrochlorothiazide (PRINZIDE,ZESTORETIC) 20-12.5 MG per tablet TAKE 1 TABLET DAILY.  30 tablet  11  . Multiple Vitamin (MULTIVITAMIN) capsule Take 1 capsule by mouth daily.        Marland Kitchen terazosin (HYTRIN) 5 MG capsule TAKE (1) CAPSULE DAILY.  30 capsule  11  . triamcinolone (KENALOG) 0.5 % cream Apply 1 application topically 2 (two) times daily.        Marland Kitchen DISCONTD: cholecalciferol (VITAMIN D) 1000 UNITS tablet Take 1,000 Units by mouth daily.        No Known Allergies   Current Medications, Allergies, Past Medical History, Past Surgical History,  Family History, and Social History were reviewed in [employer] record.  Review of Systems        See HPI - all other systems neg except as noted... The patient complains of dyspnea on exertion and difficulty walking.  The patient denies anorexia, fever, weight loss, weight gain, vision loss, decreased hearing, hoarseness, chest pain, syncope, peripheral edema, prolonged cough, headaches, hemoptysis, abdominal pain, melena, hematochezia, severe indigestion/heartburn, hematuria, incontinence, muscle weakness, suspicious skin lesions, transient blindness, depression, unusual weight change, abnormal bleeding, enlarged lymph nodes, and angioedema.   Objective:   Physical Exam     WD, overweight, 78 y/o WM in NAD...  GENERAL:  Alert & oriented; pleasant & cooperative... HEENT:  [place]/AT, EOM-full, PERRLA, EACs-clear, TMs-wnl, NOSE-clear, THROAT-clear & wnl. NECK:  Supple w/ fairROM; no JVD; normal carotid impulses w/o bruits; no thyromegaly or nodules palpated; no lymphadenopathy. CHEST:  Coarse BS but clear w/o wheezing, rales, or rhonchi... HEART:  Regular Rhythm;  gr 1/6 SEM w/o rubs or gallops detected... ABDOMEN:  Soft & sl tender right groin- s/p RIH repair; normal bowel sounds; no organomegaly or masses palpated. EXT: without deformities, mod arthritic changes; no varicose veins/ +venous insuffic/ or edema. NEURO:  CN's intact;  no focal neuro deficits...Marland Kitchenno facial droop DERM:  No lesions noted; +ecchymoses along hand/forearms  RADIOLOGY DATA:  Reviewed in the EPIC EMR & discussed w/ the patient...  LABORATORY DATA:  Reviewed in the EPIC EMR & discussed w/ the patient...   Assessment & Plan:   Yearly ROV>  Stable overall & labs are WNL...  HBP>  Controlled on Lisinopril & Hytrin, continue same...  ASPVD>  Continue ASA Rx & encouraged to exercise...  Borderline DM>  BS remains normal; continue low carb wt reducing diet...  GI>  GERD, Divertics, Polyps,  Hems>  Continue prn Zantac, and daily metamucil...  GU> followed by DrBorden> Hx papillary Ca of bladder- no recurrence, BPH, Hx elev PSA, ED> continue Finasteride & Terazosin...  DJD, LBP, Spinal Stenosis>  He maintains f/u DrWainer et al, using OTC analgesics prn...  B-cell Lymphoma> 2003 w/ cutaneous involvement, followed by DrSherrill & no evid recurrence after ~10 yrs, doing well...   Patient's Medications  New Prescriptions   No medications on file  Previous Medications   ASPIRIN 81 MG TABLET    Take 81 mg by mouth daily.     CALCIUM CARBONATE-VITAMIN D (CALCIUM-VITAMIN D) 500-200 MG-UNIT PER TABLET    Take 1 tablet by mouth 2 (two) times daily with a meal.    CHOLECALCIFEROL (VITAMIN D3) 1000 UNITS CAPS    Take 1 capsule by mouth daily.   CYPROHEPTADINE (PERIACTIN) 4 MG TABLET    TAKE AS DIRECTED.   FINASTERIDE (PROSCAR) 5 MG TABLET    Take 5 mg by mouth daily.     FLEXERIL 10 MG TABLET    TAKE 1/2 TO 1 TAB 3 TIMES A DAY AS NEEDED FOR MUSCLE SPASM.   LISINOPRIL-HYDROCHLOROTHIAZIDE (PRINZIDE,ZESTORETIC) 20-12.5 MG PER TABLET    TAKE 1 TABLET DAILY.   MULTIPLE VITAMIN (MULTIVITAMIN) CAPSULE    Take 1 capsule by mouth daily.     TERAZOSIN (HYTRIN) 5 MG CAPSULE    TAKE (1) CAPSULE DAILY.   TRIAMCINOLONE (KENALOG) 0.5 % CREAM    Apply 1 application topically 2 (two) times daily.    Modified Medications   No medications on file  Discontinued Medications   CHOLECALCIFEROL (VITAMIN D) 1000 UNITS TABLET    Take 1,000 Units by mouth daily.

## 2011-07-16 NOTE — Telephone Encounter (Signed)
Called and spoke with pt and he is aware that this is ok to come in for tdap on Monday or Tuesday to update his immunizations.  i will call GI on Monday to find out when he is due for his next colonoscopy. Pt is aware that i will call him back with this info.

## 2011-07-16 NOTE — Telephone Encounter (Signed)
Chart requested. Will await chart.

## 2011-07-16 NOTE — Patient Instructions (Addendum)
Today we updated your med list in our EPIC system...    Continue your current medications the same...  We reviewed your recent medical history & FASTING blood work...  Call for any problems or if we can be of service in any way...  Let's plan a routine f/u in 2yr, sooner if needed for any reason.Marland KitchenMarland Kitchen

## 2011-07-19 ENCOUNTER — Ambulatory Visit (INDEPENDENT_AMBULATORY_CARE_PROVIDER_SITE_OTHER): Payer: Medicare Other

## 2011-07-19 DIAGNOSIS — Z23 Encounter for immunization: Secondary | ICD-10-CM

## 2011-07-19 NOTE — Telephone Encounter (Signed)
Pt came in today for his tdap---ok per SN.  Will sign off of this note.

## 2011-08-09 ENCOUNTER — Telehealth: Payer: Self-pay | Admitting: Pulmonary Disease

## 2011-08-09 MED ORDER — LOSARTAN POTASSIUM 100 MG PO TABS: 100.0000 mg | ORAL_TABLET | Freq: Every day | ORAL | Status: DC

## 2011-08-09 NOTE — Telephone Encounter (Signed)
Per sn, yes rec. Change to losartan 100mg  1 po qd #30 with 5 refills

## 2011-08-09 NOTE — Telephone Encounter (Signed)
Pt is aware to stop the lisinopril and is being changed over to the losartan 100 mg. rx has been sent to the pharmacy. Nothing further was needed

## 2011-08-09 NOTE — Telephone Encounter (Signed)
I spoke with pt and he states he has an occasional dry cough. He read an article that the lisinopril can cause a cough and an alternative would be losartan. Pt is requesting SN thoughts on this if he thinks it would be a good idea to change. He states the cough is not worrisome. Please advise SN thanks  No Known Allergies    [facility]

## 2011-08-23 ENCOUNTER — Telehealth: Payer: Self-pay | Admitting: Adult Health

## 2011-08-23 MED ORDER — LISINOPRIL-HYDROCHLOROTHIAZIDE 20-12.5 MG PO TABS: 1.0000 | ORAL_TABLET | Freq: Every day | ORAL | Status: DC

## 2011-08-23 NOTE — Telephone Encounter (Signed)
Per SN---ok to change back per pts request.  thanks

## 2011-08-23 NOTE — Telephone Encounter (Signed)
I spoke with pt and he is requesting to switch back to lisinopril 20-12.5 mg from the losartan. He states he is still having the dry cough. He would like an rx sent to [facility] and have them place this on hold until he is ready to fill it. Please advise SN thanks

## 2011-08-23 NOTE — Telephone Encounter (Signed)
rx has been sent and pt aware 

## 2011-09-03 ENCOUNTER — Telehealth: Payer: Self-pay | Admitting: Oncology

## 2011-09-03 ENCOUNTER — Ambulatory Visit (HOSPITAL_BASED_OUTPATIENT_CLINIC_OR_DEPARTMENT_OTHER): Payer: Medicare Other | Admitting: Oncology

## 2011-09-03 VITALS — BP 135/78 | HR 104 | Temp 97.0°F | Resp 20 | Ht 69.0 in | Wt 196.3 lb

## 2011-09-03 DIAGNOSIS — C8589 Other specified types of non-Hodgkin lymphoma, extranodal and solid organ sites: Secondary | ICD-10-CM

## 2011-09-03 DIAGNOSIS — Z87898 Personal history of other specified conditions: Secondary | ICD-10-CM

## 2011-09-03 DIAGNOSIS — G8929 Other chronic pain: Secondary | ICD-10-CM

## 2011-09-03 NOTE — Telephone Encounter (Signed)
Gave pt appt for February 2014, MD visit only

## 2011-09-03 NOTE — Progress Notes (Signed)
   [facility]    OFFICE PROGRESS NOTE   INTERVAL HISTORY:   He returns as scheduled. No fever, night sweats, or suspicious skin lesions. He reports chronic pain in the right leg related to spinal stenosis.  Objective:  Vital signs in last 24 hours:  Blood pressure 135/78, pulse 104, temperature 97 F (36.1 C), temperature source Oral, resp. rate 20, height 5\' 9"  (1.753 m), weight 196 lb 4.8 oz (89.041 kg).    HEENT: Neck without mass Lymphatics: No cervical, supraclavicular, axillary, or inguinal nodes Resp: Lungs clear bilaterally Cardio: Regular rate and rhythm GI: No hepatosplenomegaly Vascular: No leg edema  Skin: Radiation sites at the lower back and right lateral chest wall without evidence of recurrent tumor. There is pain/slightly erythematous oval lesion at the left iliac region that is flat.     Lab Results:  Lab Results  Component Value Date   WBC 8.4 07/12/2011   HGB 13.4 07/12/2011   HCT 40.3 07/12/2011   MCV 90.3 07/12/2011   PLT 169.0 07/12/2011      Medications: I have reviewed the patient's current medications.  Assessment/Plan: 1. Primary cutaneous B-cell lymphoma, status post primary radiation in 2003. No clinical evidence for recurrent disease. 2. Chronic prominence at the right medial scapula, likely benign asymmetry of the back musculature. 3. History of benign prostatic hypertrophy and superficial bladder cancer, followed by Dr. Laverle Patter. 4. History of a right inguinal hernia, status post repair by Dr. Abbey Chatters.   Disposition:  He remains in clinical remission from the non-Hodgkin's lymphoma. I suspect the skin lesion at the left iliac area is a benign finding. He will let us know if this area changes. He is now followed by Dr. Yetta Barre.  David Howard would like to continue every 6 month followup at the cancer Center. He will return for an office visit in 6 months.   Thornton Papas, MD  09/03/2011  10:38 AM

## 2011-09-27 ENCOUNTER — Encounter: Payer: Self-pay | Admitting: Adult Health

## 2011-09-27 ENCOUNTER — Ambulatory Visit (INDEPENDENT_AMBULATORY_CARE_PROVIDER_SITE_OTHER): Payer: Medicare Other | Admitting: Adult Health

## 2011-09-27 VITALS — BP 128/68 | HR 89 | Temp 97.0°F | Ht 69.0 in | Wt 197.8 lb

## 2011-09-27 DIAGNOSIS — Z23 Encounter for immunization: Secondary | ICD-10-CM

## 2011-09-27 DIAGNOSIS — T148XXA Other injury of unspecified body region, initial encounter: Secondary | ICD-10-CM

## 2011-09-27 DIAGNOSIS — IMO0002 Reserved for concepts with insufficient information to code with codable children: Secondary | ICD-10-CM

## 2011-09-27 NOTE — Patient Instructions (Addendum)
Wash area with soap and water, pat dry .  Apply telfa non stick bandage with protective kling wrap.  Flu shot today  Call if redness or drainage develops.  Please contact office for sooner follow up if symptoms do not improve or worsen or seek emergency care

## 2011-09-28 NOTE — Assessment & Plan Note (Signed)
Small skin tear w/out sign of infection  Area dressed without difficulty .  Plan  Wash area with soap and water, pat dry .  Apply telfa non stick bandage with protective kling wrap.    Call if redness or drainage develops.  Please contact office for sooner follow up if symptoms do not improve or worsen or seek emergency care

## 2011-09-28 NOTE — Progress Notes (Signed)
  Subjective:    Patient ID: David Howard, male    DOB: 1923/05/13, 78 y.o.   MRN: [id number]  HPI 78 yo male with known hx of HTN   09/27/11 Acute OV  Complains of scrape/scratch on right arm x6 days - would like dressed.  Bumped his hand ~1 week ago, placed a bandage on it and when he took it off  The skin peeled away. Has very thin bruised skin on hands and forearms.  Does not like how it looks.  No redness or drainage.  Takes a daily ASA    Review of Systems Constitutional:   No  weight loss, night sweats,  Fevers, chills, fatigue, or  lassitude.  HEENT:   No headaches,  Difficulty swallowing,  Tooth/dental problems, or  Sore throat,                No sneezing, itching, ear ache, nasal congestion, post nasal drip,   CV:  No chest pain,  Orthopnea, PND, swelling in lower extremities, anasarca, dizziness, palpitations, syncope.   GI  No heartburn, indigestion, abdominal pain, nausea, vomiting, diarrhea, change in bowel habits, loss of appetite, bloody stools.   Resp: No shortness of breath with exertion or at rest.  No excess mucus, no productive cough,  No non-productive cough,  No coughing up of blood.  No change in color of mucus.  No wheezing.  No chest wall deformity  Skin: skin tear +  GU: no dysuria, change in color of urine, no urgency or frequency.  No flank pain, no hematuria   MS:  No joint pain or swelling.      Psych:  No change in mood or affect. No depression or anxiety.  No memory loss.         Objective:   Physical Exam GEN: A/Ox3; pleasant , NAD, elderly male   HEENT:  [place]/AT,  EACs-clear, TMs-wnl, NOSE-clear, THROAT-clear, no lesions, no postnasal drip or exudate noted.   NECK:  Supple w/ fair ROM; no JVD; normal carotid impulses w/o bruits; no thyromegaly or nodules palpated; no lymphadenopathy.  RESP  Clear  P & A; w/o, wheezes/ rales/ or rhonchi.no accessory muscle use, no dullness to percussion  CARD:  RRR, no m/r/g  , no peripheral edema,  pulses intact, no cyanosis or clubbing.  GI:   Soft & nt; nml bowel sounds; no organomegaly or masses detected.  Musco: Warm bil, no deformities or joint swelling noted.   Neuro: alert, no focal deficits noted.    Skin: Warm , along right mid forearm small skin tear with surround ecchymosis , no redness or drainage noted  Area cleaned and dried, non stick telfa wrap with bactroban placed with kling to help provide cushion along this area.           Assessment & Plan:

## 2011-10-07 ENCOUNTER — Encounter: Payer: Self-pay | Admitting: Gastroenterology

## 2011-10-11 ENCOUNTER — Telehealth: Payer: Self-pay | Admitting: Gastroenterology

## 2011-10-11 NOTE — Telephone Encounter (Signed)
pt received recall letter for Colonoscopy, at age 78 now and not having any GI issues, will call back to schedule ov if needs it

## 2011-11-15 ENCOUNTER — Telehealth: Payer: Self-pay | Admitting: Pulmonary Disease

## 2011-11-15 NOTE — Telephone Encounter (Signed)
Per SN---ok for the pt to take the tylenol PM along with the other meds.   Called and spoke  With pts wife and she is aware of SN recs. Nothing further is needed.

## 2011-11-15 NOTE — Telephone Encounter (Signed)
Called and spoke with pts wife and she stated that the pt is having trouble falling asleep and staying asleep.   She stated that the pt takes some meds at night and wanted to make sure that it is ok to take the tylenol pm with these meds.   SN please advise. thanks

## 2012-03-02 ENCOUNTER — Other Ambulatory Visit: Payer: Self-pay | Admitting: *Deleted

## 2012-03-02 ENCOUNTER — Telehealth: Payer: Self-pay | Admitting: *Deleted

## 2012-03-02 NOTE — Telephone Encounter (Signed)
Patient chooses not to make his appointment tomorrow. Would like to reschedule and wants his appointment on a Friday.

## 2012-03-03 ENCOUNTER — Ambulatory Visit: Payer: Medicare Other | Admitting: Oncology

## 2012-03-03 ENCOUNTER — Other Ambulatory Visit: Payer: Self-pay | Admitting: Pulmonary Disease

## 2012-03-13 ENCOUNTER — Telehealth: Payer: Self-pay | Admitting: Oncology

## 2012-03-13 NOTE — Telephone Encounter (Signed)
Pt called and r/s missed appt for April 2014, nurse notifed

## 2012-04-03 ENCOUNTER — Telehealth: Payer: Self-pay | Admitting: Pulmonary Disease

## 2012-04-03 DIAGNOSIS — L299 Pruritus, unspecified: Secondary | ICD-10-CM

## 2012-04-03 NOTE — Telephone Encounter (Signed)
SN pt is requesting to be referred to an allergist ASAP with an appt.  Pt was offered appt with CY on 4/25 but the pt stated that this appt will need to be sooner.  Please advise. Thanks  No Known Allergies

## 2012-04-03 NOTE — Telephone Encounter (Signed)
Called, spoke with pt.  Informed him of below recs per SN.  Pt would like to proceed with trying to set up an appt with either one of these drs -- whichever one can see him first.  He is aware order has been placed and someone will be calling him back with the appt date, time, and location.  Pt requesting appt be in the early afternoon and would like this asap.  States he is itching; has seen derm but was told to use a cream that didn't help.

## 2012-04-03 NOTE — Telephone Encounter (Signed)
Per SN----we can try Dr. Irena Cords, Louanne Skye.   asap for an appt for the pt.  thanks

## 2012-04-21 ENCOUNTER — Telehealth: Payer: Self-pay | Admitting: Oncology

## 2012-04-21 ENCOUNTER — Ambulatory Visit (HOSPITAL_BASED_OUTPATIENT_CLINIC_OR_DEPARTMENT_OTHER): Payer: Medicare Other | Admitting: Oncology

## 2012-04-21 ENCOUNTER — Telehealth: Payer: Self-pay | Admitting: Dietician

## 2012-04-21 VITALS — BP 138/79 | HR 90 | Temp 96.8°F | Resp 20 | Ht 69.0 in | Wt 191.7 lb

## 2012-04-21 DIAGNOSIS — Z87898 Personal history of other specified conditions: Secondary | ICD-10-CM

## 2012-04-21 DIAGNOSIS — C8589 Other specified types of non-Hodgkin lymphoma, extranodal and solid organ sites: Secondary | ICD-10-CM

## 2012-04-21 DIAGNOSIS — Z8551 Personal history of malignant neoplasm of bladder: Secondary | ICD-10-CM

## 2012-04-21 NOTE — Telephone Encounter (Signed)
gv and printed appt schedule for pt for OCT °

## 2012-04-21 NOTE — Progress Notes (Signed)
   [facility]    OFFICE PROGRESS NOTE   INTERVAL HISTORY:   He returns as scheduled. He reports generalized pruritus 5-6 weeks ago. He saw an allergist. The pruritus improved when he discontinued a new "soap ".  He continues to work part-time. There is an area of mild pruritus at the left lower back.  Objective:  Vital signs in last 24 hours:  Blood pressure 138/79, pulse 90, temperature 96.8 F (36 C), temperature source Oral, resp. rate 20, height 5\' 9"  (1.753 m), weight 191 lb 11.2 oz (86.955 kg).    HEENT: Neck without mass Lymphatics: No cervical, supraclavicular, axillary, or inguinal nodes Resp: Lungs with end inspiratory rails at the right posterior base, no respiratory distress Cardio: Regular rate and rhythm GI: No hepatosplenomegaly Vascular: No leg edema  Skin: Areas of radiation hyperpigmentation at the mid low back and right lateral chest wall. Area of slight hand/pink discoloration at the left lower back measuring 2-3 cm. This area is not thickened or raised.   Medications: I have reviewed the patient's current medications.  Assessment/Plan: 1. Primary cutaneous B-cell lymphoma, status post primary radiation in 2003. No clinical evidence for recurrent disease. 2. Chronic prominence at the right medial scapula, likely benign asymmetry of the back musculature. 3. History of benign prostatic hypertrophy and superficial bladder cancer, followed by Dr. Laverle Patter. 4. History of a right inguinal hernia, status post repair by Dr. Abbey Chatters.   Disposition:  He remains in clinical remission from non-Hodgkin's lymphoma. The slightly discolored area of skin at the left lower back is likely a benign finding. He will let us know if this changes. David Howard requests that we continue every 6 month followup at the cancer Center.   Thornton Papas, MD  04/21/2012  11:34 AM

## 2012-04-28 ENCOUNTER — Telehealth: Payer: Self-pay | Admitting: Dietician

## 2012-06-06 ENCOUNTER — Telehealth: Payer: Self-pay | Admitting: Pulmonary Disease

## 2012-06-06 DIAGNOSIS — R972 Elevated prostate specific antigen [PSA]: Secondary | ICD-10-CM

## 2012-06-06 DIAGNOSIS — F411 Generalized anxiety disorder: Secondary | ICD-10-CM

## 2012-06-06 DIAGNOSIS — R7309 Other abnormal glucose: Secondary | ICD-10-CM

## 2012-06-06 DIAGNOSIS — E7800 Pure hypercholesterolemia, unspecified: Secondary | ICD-10-CM

## 2012-06-06 DIAGNOSIS — K5730 Diverticulosis of large intestine without perforation or abscess without bleeding: Secondary | ICD-10-CM

## 2012-06-06 DIAGNOSIS — I10 Essential (primary) hypertension: Secondary | ICD-10-CM

## 2012-06-06 NOTE — Telephone Encounter (Signed)
Spoke with Shon Hale (wife to patient) understands that I will send message to SN  and Marliss Czar to advise what labs to draw; will call them once labs have been entered in EPIC. Please advise. Thanks.

## 2012-06-07 NOTE — Telephone Encounter (Signed)
lmtcb x1 

## 2012-06-07 NOTE — Telephone Encounter (Signed)
Labs have been placed in the computer for the pt and he can come 1 week prior to his appt with SN.  thanks

## 2012-06-08 NOTE — Telephone Encounter (Signed)
Pt is aware. David Howard, CMA  

## 2012-06-30 ENCOUNTER — Other Ambulatory Visit: Payer: Self-pay | Admitting: Pulmonary Disease

## 2012-07-10 ENCOUNTER — Telehealth: Payer: Self-pay | Admitting: Pulmonary Disease

## 2012-07-10 NOTE — Telephone Encounter (Signed)
Pt called back in may and we did this for him and placed labs.   I called and made pt aware labs have already been placed. Nothing further was needed

## 2012-07-11 ENCOUNTER — Other Ambulatory Visit (INDEPENDENT_AMBULATORY_CARE_PROVIDER_SITE_OTHER): Payer: Medicare Other

## 2012-07-11 DIAGNOSIS — K5730 Diverticulosis of large intestine without perforation or abscess without bleeding: Secondary | ICD-10-CM

## 2012-07-11 DIAGNOSIS — E7800 Pure hypercholesterolemia, unspecified: Secondary | ICD-10-CM

## 2012-07-11 DIAGNOSIS — I10 Essential (primary) hypertension: Secondary | ICD-10-CM

## 2012-07-11 DIAGNOSIS — F411 Generalized anxiety disorder: Secondary | ICD-10-CM

## 2012-07-11 LAB — HEPATIC FUNCTION PANEL
ALT: 17 U/L (ref 0–53)
Bilirubin, Direct: 0.2 mg/dL (ref 0.0–0.3)
Total Bilirubin: 1.3 mg/dL — ABNORMAL HIGH (ref 0.3–1.2)

## 2012-07-11 LAB — CBC WITH DIFFERENTIAL/PLATELET
Basophils Relative: 0.5 % (ref 0.0–3.0)
Eosinophils Absolute: 0.6 10*3/uL (ref 0.0–0.7)
HCT: 39.9 % (ref 39.0–52.0)
Hemoglobin: 13.5 g/dL (ref 13.0–17.0)
Lymphs Abs: 1.8 10*3/uL (ref 0.7–4.0)
MCHC: 33.9 g/dL (ref 30.0–36.0)
MCV: 90.7 fl (ref 78.0–100.0)
Monocytes Absolute: 0.5 10*3/uL (ref 0.1–1.0)
Neutro Abs: 5.4 10*3/uL (ref 1.4–7.7)
RBC: 4.4 Mil/uL (ref 4.22–5.81)

## 2012-07-11 LAB — LIPID PANEL
LDL Cholesterol: 88 mg/dL (ref 0–99)
Total CHOL/HDL Ratio: 3
Triglycerides: 97 mg/dL (ref 0.0–149.0)

## 2012-07-11 LAB — BASIC METABOLIC PANEL
Calcium: 9.4 mg/dL (ref 8.4–10.5)
Chloride: 100 mEq/L (ref 96–112)
Creatinine, Ser: 1.1 mg/dL (ref 0.4–1.5)

## 2012-07-18 ENCOUNTER — Ambulatory Visit (INDEPENDENT_AMBULATORY_CARE_PROVIDER_SITE_OTHER): Payer: Medicare Other | Admitting: Pulmonary Disease

## 2012-07-18 ENCOUNTER — Encounter: Payer: Self-pay | Admitting: Pulmonary Disease

## 2012-07-18 VITALS — BP 116/66 | HR 68 | Temp 97.8°F | Ht 69.0 in | Wt 190.4 lb

## 2012-07-18 DIAGNOSIS — M542 Cervicalgia: Secondary | ICD-10-CM

## 2012-07-18 DIAGNOSIS — K5730 Diverticulosis of large intestine without perforation or abscess without bleeding: Secondary | ICD-10-CM

## 2012-07-18 DIAGNOSIS — I10 Essential (primary) hypertension: Secondary | ICD-10-CM

## 2012-07-18 DIAGNOSIS — I739 Peripheral vascular disease, unspecified: Secondary | ICD-10-CM

## 2012-07-18 DIAGNOSIS — M545 Low back pain, unspecified: Secondary | ICD-10-CM

## 2012-07-18 DIAGNOSIS — L299 Pruritus, unspecified: Secondary | ICD-10-CM

## 2012-07-18 DIAGNOSIS — C679 Malignant neoplasm of bladder, unspecified: Secondary | ICD-10-CM

## 2012-07-18 DIAGNOSIS — K219 Gastro-esophageal reflux disease without esophagitis: Secondary | ICD-10-CM

## 2012-07-18 DIAGNOSIS — K5900 Constipation, unspecified: Secondary | ICD-10-CM

## 2012-07-18 DIAGNOSIS — Z87898 Personal history of other specified conditions: Secondary | ICD-10-CM

## 2012-07-18 DIAGNOSIS — M4800 Spinal stenosis, site unspecified: Secondary | ICD-10-CM

## 2012-07-18 DIAGNOSIS — R7309 Other abnormal glucose: Secondary | ICD-10-CM

## 2012-07-18 NOTE — Progress Notes (Signed)
Subjective:    Patient ID: David Howard, male    DOB: [birth year], 79 y.o.   MRN: [id number]  HPI 79 y/o WM here for a follow up visit... he has multiple medical problems as noted below...   ~  Dec09:  episode of severe abd pain lasting 14hrs... the pain was mid-abdominal and ?assoc w/ bloating/ swelling, but he denies N/ V/ RUQ pain or tender, no D/ C/ change in BM's, blood etc... he noted a bulge in right groin 5-6 months ago and immed saw DrSherrill who Dx a RIH... had Urology f/u w/ DrBorden who concurred w/ RIH dx, but it was non-tender and easily reduced so they rec conservative approach- watch it... pain is gone at present and his exam neg- except for [facility] that easily reduces when supine... ~  saw GI w/ persistant/ intermittent abd pain... colonoscopy 01/16/08 by DrStark w/ divertics, ?ileitis of terminal ileum?, hems... 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 bx of term ileum without inflamm, & clinically no better on Pred Rx, therefore sent to surg for Lap... ~  had Dx laparascopy 02/16/08 by Clyde Lundborg w/ finding of RIH containing terminal ileum & cecum w/ part obstruction- hernia repaired w/ mesh... ~  Feb10:  Improved overall but still weak and mult concerns about his evaluation- all discussed w/ pt & wife today in the office... lab work done 2/15 & looks OK.  ~  July 05, 2008:  stable- doing satis, labs 07/02/08 reviewed w/ pt... he saw DrRamos 3/10 for f/u pain management/ sp stenosis L4-5: prev Gabitril stopped working for him, offered sp cord stim/ selective nerve root blocks (holding off unless absolutely needed), on LYRICA now 150mg Qhs w/ mild benefit... he saw Urology, DrBorden 3/10 for f/u bladder Ca, BPH, elevated PSA, ED... neg cystoscopy w/o recurrent bladder tumors, incr PSA velocity to 3.21 on Finasteride (continue surveillance for now), on Terazosin + Finasteride  Rx for BPH... he saw DrWainer recently w/ cortisone shot in left shoulder- sl better now... he saw DrSherrill last week for f/u cutaneous lymphoma- doing well, no recurrence...  ~  July 11, 2009:  yearly ROV- stable w/ severe pain from sp stenosis right buttock & leg> DrRamos Rx w/ Lyrica 150mg Qhs & he has good days & bad... he had bronchitic infection 3/11- resolved w/ ZPak & Pred... BP controlled on meds;  DM borderline- doing satis on diet alone;  GI is stable;  GU followed by DrBorden- had cysto 4/11= neg & he felt they could stop the cystoscopic surveilance, but he wants to continue PSA's Q60mo... f/u fasting labs last week reviewed w/ pt- doing well, same meds.  ~  July 17, 2010:  Yearly ROV & David Howard has a list of concerns- all addressed w/ pt> on Calcium Bid w/ Vit D (OK); on ASA 81mg  Qod & asked to incr to daily if he can; dry throat- makes him cough & lozenges help; good report from DrSherrill, check spot on back- all look like SK's & cherry hemangiomas to me...    He saw DrBorden last 10/11 for f/u papillary bladder cancer 2005 w/o recurrence & last cysto 4/11 was neg (he doesn't feel need for any further surveillance cystos); Hx incr PSA- improved w/ Proscar + Hytrin rx; BPH w/ LTOS- improved on RX, & ED;  Doing well, no change in therapy...    He saw DrWainer 4/12 for knee bursitis, Cx & Lumbar DDD> given  knee injection w/ improvement...    He saw DrSherrill 6/12 for f/u of his primary B-cell lymphoma, s/p XRT in 2003, w/o evid of recurrence...    LABS 6/12 including FLP, CBC, Chems, TSH> all look good...  ~  July 16, 2011:  16yr ROV & David Howard is doing reasonably well but has considerable chr difficulty w/ his neck & back pain> he has been seen by DrWainer, Bonne Dolores, DrNudelman in the past; he had shots but the last one was yrs ago; he only takes an occas Tylenol, Advil, Aleve, & has Flexeril 10mg  for prn use... He saw DrWainer 4/13 w/ right knee pain- felt to be bursitis, given injection w/ some  relief...    He saw DrBorden for Urology 1/13> f/u bladder tumor w/ TURBT 2005, elevPSA, BPH/LTOS, & ED- papillary neoplasm of uncertain neoplastic potential removed from his bladder in 2005, he had surveillance cystos for 67yrs- no recurrence & now he is checked once yearly; also has hx elev PSAs & he insists on continued PSA screening- and it was ok per pt; he remains on Finasteride5mg  & Terazosin5mg  daily w/ mod LUTS but is managing very well...    He saw DrSherrill 2/13 & Dr. Donzetta Starch for Derm 3/13> hx cutaneous B-cell lymphoma, treated w/ XRT 2003, skin exam was neg- no lesions; he sees Oncology Q35mo & Derm yearly... We reviewed prob list, meds, xrays and labs> see below>> LABS 6/13:  FLP- at goals on diet alone;  Chems- wnl;  CBC- wnl;  TSH=1.17;  VitD=40...   ~  July 18, 2012:  Yearly ROV & David Howard CC is itching for which he has seen Vaughan Sine, DrSharma for complete allergy testing & Rx, and DrSherrill; he is also c/o his severe back prob w/ pain primarily in his right hip & leg- managed by DrWainer, Sharol Given; he was even sent by DrWainer to [facility] but they said no surg;  He notes that he still works 4 half days per week & reads 4 newspapers daily + mult magazines regularly, still going strong, occas uses "alert" if he gets sleepy during the day- we discussed naps...    HBP> on diet alone + Hytrin5 for his prostate; BP= 116/66 & he denies HA, dizzy, CP, palpit, SOB, edema...    ASPVD> on ASA81; known left common iliac art aneurysm measuring ~2cm; he denioes claudication, swelling, groin pain, etc...    Impaired Gluc Tol> on diet alone; Labs 6/14 showed BS= 117 & we reviewed low carb diet...    GI- GERD, Divertics, Polyps, Hems> not currently on meds; followed by DrStark, last colon 2009 w/ ?ileitis? but at [facility] Abbey Chatters) he had RIH w/ cecum & TI...    GU- HxBPH w/ BOO, Hx Bladder cancer> on Proscar5 & Hytrin5; followed by DrBorden- seen 2/14 for f/u bladder tumor, BPH, hx elev PSA, ED> PSA=2.16,  same meds, no changes made...    LBP, Spinal stenosis> on calcium, MVI, VitD, Flexeril prn; he manages quite well & has been told that surg, shots, etc would not be of benefit...    Hx non-hodgkin's lymphoma> followed by DrSherrill Q46mo> seen 4/14 & exam unchanged, no sign of recurrence...    DERM> c/o pruritis & eval by Derm & DrSharma; on Periactin4mg  prn; he has tried numerous meds including Atarax, Claritin, moisturizing creams; Rec to take Allegra180 due to elev eos on blood smear... We reviewed prob list, meds, xrays and labs> see below for updates >>  LABS 6/14:  FLP- at goals on diet  alone;  Chems- ok x BS=117;  CBC- wnl;  TSH=1.42...           Problem List:    his last Tetanus shot=2002, last Pneumovax=1998, had Shingles vaccine 10/10, gets yearly Flu shots...   HYPERTENSION (ICD-401.9) - on LISINOPRIL/ HCT 20-12.5 daily & HYTRIN 5mg /d... ~  CXR 6/09 showed stable heart size, sl elev left hemidiaph, scoliosis, clear lungs, NAD.Marland Kitchen. ~  CXR 1/12 showed stable, no change... ~  6/12:  BP= 126/80 today- feeling well; home BP checks ave 120-130/ 70's; denies HA, fatigue, visual changes, CP, palipit, dizziness, syncope, dyspnea, edema, etc... ~  6/13:  BP= 112/72 & he remains largely asymptomatic... ~  7/14: on diet alone + Hytrin5 for his prostate; BP= 116/66 & he denies HA, dizzy, CP, palpit, SOB, edema.  PERIPHERAL VASCULAR DISEASE (ICD-443.9) - on ASA 81mg  taking Qod & asked to incr to daily... CT Abd 12/09 showed left common iliac art aneurysm= 2.1cm among other things...   DIABETES MELLITUS, BORDERLINE (ICD-790.29) - on diet alone and his weight is stable at ~200#... we discussed diet therapy & try to find some type of exercise that doesn't hurt his back to aid wt reduction. ~  labs over the last 2 yrs showed BS's 114 - 123 ~  labs 07/10/07 showed FBS= 115... continue diet efforts (it's hard for him to exerc due to back pain).  ~  Chol values are good on diet alone- TChol 173, TG 55, HDL  75, LDL 87 ~  BS 2/10 = 98... rec- continue diet Rx. ~  labs 6/10 showed BS=109, TChol 179, TG 50, HDL 81, LDL 88 ~  labs 6/11 showed BS=107, TChol 186, TG 78, HDL 78, LDL 93 ~  Labs 6/12 showed BS= 104, TChol 174, TG 60, HDL 71, LDL 91 ~  Labs 6/13 showed BS= 110, TChol 162, TG 70, HDL 70, LDL 78 ~  Labs 6/14 showed BS= 117, Chol 176, TG 97, HDL 68, LDL 88  GERD (ICD-530.81) - mild symptoms, uses OTC meds (Zanrtac) as needed.  DIVERTICULOSIS, COLON (ICD-562.10), INTERNAL HEMORRHOIDS (ICD-455.0),  COLON POLYP (ICD-211.3) ~  colonoscopy 10/08 by DrStark showed divertics, hems, sm 4mm polyp= tubular adenoma... ~  colonoscopy repeated 12/09 as above w/ divertics, ?ileitis of terminal ileum?, hems...  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 bx of term ileum without inflamm, & clinically no better on Pred Rx, therefore sent to surg for Lap= RIH w/ cecum & TI. ~  He uses Metamucil daily & Miralax prn...  BENIGN PROSTATIC HYPERTROPHY, HX OF (ICD-V13.8) - on TERAZOSIN 5mg /d + FINASTERIDE 5mg /d... ELEVATED PROSTATE SPECIFIC ANTIGEN (ICD-790.93) - followed by DrBorden... Hx of BLADDER CANCER (ICD-188.9) - all followed by DrHumphries & now DrBorden- seen 9/09 w/ Dx of Bladder Tumor (papillary neoplasm), s/p TURBT 6/05 & prev had surveillance cysto's yearly (all were neg & last one done 4/11);  Hx elevated PSA's that decreased appropriately on Proscar;  BPH w/ nocturia;  ED... ~  seen 3/10 w/ neg cystoscopy, increased PSA velocity on Finasteride (PSA 3.21), & BPH symptoms stable. ~  seen 4/11 w/ neg cysto & decision made to stop surveillance cystos, he wants to continue Q71mo PSA's etc... ~  He continues to f/u w/ DrBorden Q78mo & stable...   BACK PAIN, LUMBAR (ICD-724.2) - severely limited in exercise due to LBP... Prev on Lyrica 150mg Qhs; still uses FLEXERIL 10mg Tid Prn &  OTC  anti-inflamm rx. SPINAL STENOSIS (ICD-724.00) - followed by DrRamos (Pain), DrWainer (Ortho), DrNudelman (NS)...  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 he has been in clinical remission ever since then... followed Q37months by DrSherril... DrTurner is his Dermatologist...  DERM - he takes PERIACTIN 4mg BidPrn & TRIAMCINOLONE cream per DrTurner... he has several ecchymoses... he is concerned about his 81mg  ASA daily- he notes that his skin is very thin and he bruises easily "ugly damn marks appear w/ the slightest touch"... he wants to decr his ASA to every other day... OK.   Past Surgical History  Procedure Laterality Date  . Tonsillectomy    . Cataract extraction    . Laparoscopy    . Pilonidal cyst / sinus excision      Outpatient Encounter Prescriptions as of 07/18/2012  Medication Sig Dispense Refill  . aspirin 81 MG tablet Take 81 mg by mouth daily.        . Calcium Carbonate-Vitamin D (CALCIUM-VITAMIN D) 500-200 MG-UNIT per tablet Take 1 tablet by mouth 2 (two) times daily with a meal.       . Cholecalciferol (VITAMIN D3) 1000 UNITS CAPS Take 1 capsule by mouth daily.      . cyproheptadine (PERIACTIN) 4 MG tablet TAKE AS DIRECTED.  30 tablet  5  . finasteride (PROSCAR) 5 MG tablet Take 5 mg by mouth daily.        Marland Kitchen FLEXERIL 10 MG tablet TAKE 1/2 TO 1 TAB 3 TIMES A DAY AS NEEDED FOR MUSCLE SPASM.  100 each  5  . lisinopril-hydrochlorothiazide (PRINZIDE,ZESTORETIC) 20-12.5 MG per tablet TAKE 1 TABLET DAILY.  30 tablet  11  . Multiple Vitamin (MULTIVITAMIN) capsule Take 1 capsule by mouth daily.        Marland Kitchen terazosin (HYTRIN) 5 MG capsule TAKE (1) CAPSULE DAILY.  30 capsule  11  . triamcinolone (KENALOG) 0.5 % cream Apply 1 application topically 2 (two) times daily.         No facility-administered encounter medications on file as of 07/18/2012.    No Known  Allergies   Current Medications, Allergies, Past Medical History, Past Surgical History, Family History, and Social History were reviewed in [employer] record.   Review of Systems        See HPI - all other systems neg except as noted... The patient complains of dyspnea on exertion and difficulty walking.  The patient denies anorexia, fever, weight loss, weight gain, vision loss, decreased hearing, hoarseness, chest pain, syncope, peripheral edema, prolonged cough, headaches, hemoptysis, abdominal pain, melena, hematochezia, severe indigestion/heartburn, hematuria, incontinence, muscle weakness, suspicious skin lesions, transient blindness, depression, unusual weight change, abnormal bleeding, enlarged lymph nodes, and angioedema.   Objective:   Physical Exam     WD, overweight, 79 y/o WM in NAD...  GENERAL:  Alert & oriented; pleasant & cooperative... HEENT:  [place]/AT, EOM-full, PERRLA, EACs-clear, TMs-wnl, NOSE-clear, THROAT-clear & wnl. NECK:  Supple w/ fairROM; no JVD; normal carotid impulses w/o bruits; no thyromegaly or nodules palpated; no lymphadenopathy. CHEST:  Coarse BS but clear w/o wheezing, rales, or rhonchi... HEART:  Regular Rhythm;  gr 1/6 SEM w/o rubs or gallops detected... ABDOMEN:  Soft & sl tender right groin- s/p RIH repair; normal bowel sounds; no organomegaly or masses palpated. EXT: without deformities, mod arthritic changes; no varicose veins/ +venous insuffic/ or edema. NEURO:  CN's intact;  no focal neuro deficits...Marland Kitchenno facial droop DERM:  No lesions noted; +ecchymoses along hand/forearms  RADIOLOGY DATA:  Reviewed in the EPIC EMR & discussed w/ the patient...  LABORATORY DATA:  Reviewed in the EPIC EMR & discussed w/ the patient...   Assessment & Plan:    Yearly ROV>  Stable overall & labs are WNL...  HBP>  Controlled on Hytrin, continue same...  ASPVD>  Continue ASA Rx & encouraged to exercise...  Borderline DM>  BS = 117;  continue low carb wt reducing diet...  GI>  GERD, Divertics, Polyps, Hems>  Continue prn Zantac, and daily metamucil; followed by DrStark...  GU> followed by DrBorden> Hx papillary Ca of bladder- no recurrence, BPH, Hx elev PSA, ED> continue Finasteride & Terazosin & followed by DrBorden...  DJD, LBP, Spinal Stenosis>  He maintains f/u DrWainer et al, using OTC analgesics prn...  B-cell Lymphoma> 2003 w/ cutaneous involvement, followed by DrSherrill & no evid recurrence after ~10 yrs, doing well...   Patient's Medications  New Prescriptions   No medications on file  Previous Medications   ASPIRIN 81 MG TABLET    Take 81 mg by mouth daily.     CALCIUM CARBONATE-VITAMIN D (CALCIUM-VITAMIN D) 500-200 MG-UNIT PER TABLET    Take 1 tablet by mouth 2 (two) times daily with a meal.    CHOLECALCIFEROL (VITAMIN D3) 1000 UNITS CAPS    Take 1 capsule by mouth daily.   CYPROHEPTADINE (PERIACTIN) 4 MG TABLET    TAKE AS DIRECTED.   FINASTERIDE (PROSCAR) 5 MG TABLET    Take 5 mg by mouth daily.     FLEXERIL 10 MG TABLET    TAKE 1/2 TO 1 TAB 3 TIMES A DAY AS NEEDED FOR MUSCLE SPASM.   LISINOPRIL-HYDROCHLOROTHIAZIDE (PRINZIDE,ZESTORETIC) 20-12.5 MG PER TABLET    TAKE 1 TABLET DAILY.   MULTIPLE VITAMIN (MULTIVITAMIN) CAPSULE    Take 1 capsule by mouth daily.     TERAZOSIN (HYTRIN) 5 MG CAPSULE    TAKE (1) CAPSULE DAILY.   TRIAMCINOLONE (KENALOG) 0.5 % CREAM    Apply 1 application topically 2 (two) times daily.    Modified Medications   No medications on file  Discontinued Medications   No medications on file

## 2012-07-18 NOTE — Patient Instructions (Addendum)
Today we updated your med list in our EPIC system...    Continue your current medications the same...  We reviewed your recent blood work & gave you a copy for your records...  For the Itching>>    Try an antihistamine- Allegra180, Zyrtek10, or Claritin10 & take one daily...   Call for any questions...  Let's plan a follow up visit in 47yr, sooner if needed for problems.Marland KitchenMarland Kitchen

## 2012-07-31 ENCOUNTER — Telehealth: Payer: Self-pay | Admitting: Pulmonary Disease

## 2012-07-31 NOTE — Telephone Encounter (Signed)
I spoke with the pt and he states that he and his wife both need a renewal on their handicap placards. I have completed a form for each and placed on SN cart to sign. The pt spouse is Daryle Amis 09-17-29 and she is also a [facility] pt. Mr. Huhn requests that the forms be mailed to his home once completed. Carron Curie, CMA

## 2012-07-31 NOTE — Telephone Encounter (Signed)
Handicap placard forms completed and signed by SN for both pt and his wife who is also a pt.  I have placed forms in mail.  Pt, David Howard, aware.

## 2012-08-11 ENCOUNTER — Telehealth: Payer: Self-pay | Admitting: Pulmonary Disease

## 2012-08-11 MED ORDER — HYDROXYZINE HCL 25 MG PO TABS: 25.0000 mg | ORAL_TABLET | Freq: Three times a day (TID) | ORAL | Status: DC | PRN

## 2012-08-11 NOTE — Telephone Encounter (Signed)
Per SN--  Ok to call in atarax 1 po tid prn itching.  i called and spoke with the pt and he stated that he will try the atarax for now to see if this helps.  If this does not help, he will call to schedule appt with CY.  Pt is aware that this is ok with SN.  nothiing furhter is needed.

## 2012-08-11 NOTE — Telephone Encounter (Signed)
I spoke with pt. He stated he mentioned to SN last OV 07/18/12 about "itching all over". He stated he did what SN suggested and took Claritin/Aleegra and it has not helped. He has already saw dermatology and Dr. St.  Callas and per pt they have not been helpful. Per pt he spoke with his nephew Dr. Dellia Cloud  Last night and stated he spoke with Dr. Maple Hudson to see if he had any recs. Per pt Dr. Maple Hudson advised her would be happy to see pt for evaluation if need be. Per pt he is wanting to know if there is anything else that he could try to help with itching or if SN thinks he should see Dr. Maple Hudson. Pt stated he has not changed nay meds or do anything new to cause this itching. He does not break out in rash or have red spots. Please advise SN thanks  No pending appt  No Known Allergies

## 2012-08-18 ENCOUNTER — Telehealth: Payer: Self-pay | Admitting: Pulmonary Disease

## 2012-08-18 NOTE — Telephone Encounter (Signed)
Called, spoke with pt who is a SN PCP pt -  Pt would like to know how successful CY has been in treating itch.  He has been "extremely unsuccessful" in having this treated.  He has been seen by dermatology, an allergist, and Dr. Kriste Basque.  No recommendations have helped.  Pt has tried antihistamines, atarax, and other rxs have been tried for 30 days with no relief.  He's had skin testing done.  Pt would like to schedule appt with CDY if CDY feels he could help him, but would first like to know, before scheduling appt, if CDY believes he can.  Dr. Maple Hudson, pls advise.  Thank you.  ** Pls see phone msg from 7.25 for further information.

## 2012-08-21 NOTE — Telephone Encounter (Signed)
Spoke with pt and notified of recs per CDY He verbalized understanding and nothing further needed

## 2012-08-21 NOTE — Telephone Encounter (Signed)
Sorry, needed time to look at record. Chronic pruritus without rash is very unlikely to have an allergic basis, so I'm afraid I have nothing to offer. Sometimes chronic systemic illness or pemphigoid without bullae might present like this. He might want to discuss university referral with Dr Kriste Basque.

## 2012-08-21 NOTE — Telephone Encounter (Signed)
Pt has returned the call & was upset that he hasn't heard anything back from [facility] or his nurse.  Pt states that he is not questioning his medical abilities, pt has gone through so many different doctors & treatments, would like to know if CY believes he can help the pt.  Pt can be reached at  [phone] or [phone].   Antionette Fairy

## 2012-10-10 ENCOUNTER — Ambulatory Visit (INDEPENDENT_AMBULATORY_CARE_PROVIDER_SITE_OTHER): Payer: Medicare Other

## 2012-10-10 DIAGNOSIS — Z23 Encounter for immunization: Secondary | ICD-10-CM

## 2012-10-19 ENCOUNTER — Telehealth: Payer: Self-pay | Admitting: Oncology

## 2012-10-19 NOTE — Telephone Encounter (Signed)
Talked to pt's wife gave her appt for 11/24/12 MD only

## 2012-11-17 ENCOUNTER — Ambulatory Visit: Payer: Medicare Other | Admitting: Oncology

## 2012-11-24 ENCOUNTER — Telehealth: Payer: Self-pay | Admitting: Oncology

## 2012-11-24 ENCOUNTER — Ambulatory Visit (HOSPITAL_BASED_OUTPATIENT_CLINIC_OR_DEPARTMENT_OTHER): Payer: Medicare Other | Admitting: Oncology

## 2012-11-24 VITALS — BP 152/78 | HR 82 | Temp 97.8°F | Resp 18 | Ht 69.0 in | Wt 190.6 lb

## 2012-11-24 DIAGNOSIS — Z87898 Personal history of other specified conditions: Secondary | ICD-10-CM

## 2012-11-24 DIAGNOSIS — L299 Pruritus, unspecified: Secondary | ICD-10-CM

## 2012-11-24 DIAGNOSIS — C8589 Other specified types of non-Hodgkin lymphoma, extranodal and solid organ sites: Secondary | ICD-10-CM

## 2012-11-24 NOTE — Progress Notes (Signed)
   [facility]    OFFICE PROGRESS NOTE   INTERVAL HISTORY:   Mr. Scialdone returns as scheduled. No fever, night sweats, or anorexia. He complains of pruritus for the past year. The pruritus is intermittent and in various areas. There is no rash. He develops ecchymoses after scratching.  His wife is concerned about a few skin lesions at the new back.  Objective:  Vital signs in last 24 hours:  Blood pressure 152/78, pulse 82, temperature 97.8 F (36.6 C), temperature source Oral, resp. rate 18, height 5\' 9"  (1.753 m), weight 190 lb 9.6 oz (86.456 kg).    HEENT: Neck without mass, sclera anicteric Lymphatics: No cervical, supraclavicular, axillary, or inguinal nodes Resp: End inspiratory rhonchi at the posterior basis, no respiratory distress Cardio: Regular rate and rhythm GI: No hepatosplenomegaly, nontender Vascular: No leg edema  Skin: Multiple benign appearing moles over the trunk and extremities , no suspicious lesions at the mid back. Flat slightly purplish 1-2 cm area at the left iliac region. Areas of radiation hyperpigmentation at the mid low back and right lateral chest wall are without nodularity. Ecchymoses at the right shoulder and right arm in areas where he has been scratching.   Lab Results:  Lab Results  Component Value Date   WBC 8.3 07/11/2012   HGB 13.5 07/11/2012   HCT 39.9 07/11/2012   MCV 90.7 07/11/2012   PLT 175.0 07/11/2012   07/11/2012-bilirubin 1.3   Medications: I have reviewed the patient's current medications.  Assessment/Plan: 1. Primary cutaneous B-cell lymphoma, status post primary radiation in 2003. No clinical evidence for recurrent disease. 2. Chronic prominence at the right medial scapula, likely benign asymmetry of the back musculature. 3. History of benign prostatic hypertrophy and superficial bladder cancer, followed by Dr. Laverle Patter. 4. History of a right inguinal hernia, status post repair by Dr.  Abbey Chatters. 5. Pruritus-etiology unclear. I recommended he followup with Dr.Nadel. The bilirubin was mildly elevated in June    Disposition:  He remains in clinical remission from non-Hodgkin's lymphoma. He would like to continue every 6 month followup at the [facility]. He will see Dr. Kriste Basque to address the pruritus. He will contact us for suspicious skin lesions.   Thornton Papas, MD  11/24/2012  10:16 AM

## 2012-11-24 NOTE — Telephone Encounter (Signed)
gave pt appt fo [facility] only on MAy 2015

## 2012-11-27 ENCOUNTER — Telehealth: Payer: Self-pay | Admitting: Pulmonary Disease

## 2012-11-27 NOTE — Telephone Encounter (Signed)
I advised that per OV note pt is to f/u in 1 year. I asked if he has anything new going on and he states no he just thought with is age he would need to f/u sooner. I advised per recs he is to f/u in 1 year. I advised if anything comes up between now and then to call. Carron Curie, CMA

## 2012-11-27 NOTE — Telephone Encounter (Signed)
lmtcb x1 

## 2012-12-28 ENCOUNTER — Other Ambulatory Visit: Payer: Self-pay | Admitting: Pulmonary Disease

## 2013-03-01 ENCOUNTER — Other Ambulatory Visit: Payer: Self-pay | Admitting: Pulmonary Disease

## 2013-03-16 ENCOUNTER — Other Ambulatory Visit: Payer: Self-pay | Admitting: Dermatology

## 2013-04-12 ENCOUNTER — Telehealth: Payer: Self-pay | Admitting: Adult Health

## 2013-04-12 NOTE — Telephone Encounter (Signed)
Called and spoke w/ pt. Offered the other [facility] office and the NP downstairs. He was not happy that an MD downstairs could not see him. I advised him from what we are aware none of the MD's 1st floor had openings. He reports he will see what he can do and needed nothing further

## 2013-04-23 ENCOUNTER — Encounter: Payer: Self-pay | Admitting: Internal Medicine

## 2013-04-23 ENCOUNTER — Ambulatory Visit: Payer: Medicare Other

## 2013-04-23 ENCOUNTER — Ambulatory Visit (INDEPENDENT_AMBULATORY_CARE_PROVIDER_SITE_OTHER): Payer: Medicare Other | Admitting: Internal Medicine

## 2013-04-23 ENCOUNTER — Telehealth: Payer: Self-pay

## 2013-04-23 ENCOUNTER — Telehealth: Payer: Self-pay | Admitting: Pulmonary Disease

## 2013-04-23 VITALS — BP 140/80 | HR 91 | Resp 14

## 2013-04-23 DIAGNOSIS — S0180XA Unspecified open wound of other part of head, initial encounter: Secondary | ICD-10-CM

## 2013-04-23 DIAGNOSIS — S61409A Unspecified open wound of unspecified hand, initial encounter: Secondary | ICD-10-CM

## 2013-04-23 DIAGNOSIS — S060XAA Concussion with loss of consciousness status unknown, initial encounter: Secondary | ICD-10-CM

## 2013-04-23 DIAGNOSIS — S060X9A Concussion with loss of consciousness of unspecified duration, initial encounter: Secondary | ICD-10-CM

## 2013-04-23 NOTE — Telephone Encounter (Signed)
Patient states his neck hurts after the fall this morning.   He wants to know if there is any pain medication he can take.   [phone]

## 2013-04-23 NOTE — Progress Notes (Signed)
   Subjective:    Patient ID: David Howard, male    DOB: Dec 22, 1923, 80 y.o.   MRN: [id number]  HPI Slipped in shower at home, hit forehead with brief loc and large vertical bleeding wound. Also wound dorsal right hand and bridge of nose. Totally alert, oriented, in charge as usual. Neck sore but his normal. Hand has full function. Nose wound is an avulsion.   Review of Systems     Objective:   Physical Exam  Constitutional: He is oriented to person, place, and time. He appears well-developed and well-nourished. He appears distressed.  HENT:  Head: Normocephalic. Head is with contusion and with laceration. Head is without raccoon's eyes and without Battle's sign.    Right Ear: External ear normal.  Left Ear: External ear normal.  Nose: Nose lacerations and sinus tenderness present. No mucosal edema, rhinorrhea or nasal deformity. No epistaxis. Right sinus exhibits no maxillary sinus tenderness and no frontal sinus tenderness. Left sinus exhibits no maxillary sinus tenderness and no frontal sinus tenderness.    Mouth/Throat: Oropharynx is clear and moist.  Avulsion wound  Eyes: Conjunctivae and EOM are normal. Pupils are equal, round, and reactive to light.  Neck: Muscular tenderness present. No spinous process tenderness present. Decreased range of motion present.  Cardiovascular: Normal rate, regular rhythm and normal heart sounds.   Pulmonary/Chest: Effort normal and breath sounds normal.  Abdominal: Soft.  Musculoskeletal: He exhibits tenderness.       Arms:      Right hand: He exhibits laceration. He exhibits normal range of motion, no tenderness, no bony tenderness, normal capillary refill and no deformity. Normal sensation noted. Normal strength noted.       Hands: Superficial avulsion  Neurological: He is alert and oriented to person, place, and time. He exhibits normal muscle tone. Coordination normal.  Psychiatric: He has a normal mood and affect. His behavior is  normal. Judgment and thought content normal.   Wound repairs by [place] for Prescribing Controlled Substances (Revised 11/2011) 1. Prescriptions for controlled substances will be filled by ONE provider at [facility] with whom you have established and developed a plan for your care, including follow-up. 2. You are encouraged to schedule an appointment with your prescriber at our appointment center for follow-up visits whenever possible. 3. If you request a prescription for the controlled substance while at [facility] for an acute problem (with someone other than your regular prescriber), you MAY be given a ONE-TIME prescription for a 30-day supply of the controlled substance, to allow time for you to return to see your regular prescriber for additional prescriptions [facility] reading (PRIMARY) by  Dr.Cherie Lasalle no fx seen      Assessment & Plan:  Wound care/Stop asa 1 week Tylenol for pain See [facility] [place] for fainting spell this week. Head care Report 24 hrs how you feel

## 2013-04-23 NOTE — Progress Notes (Signed)
Procedure Note: Verbal consent obtained from the patient.  Local anesthesia with 4 cc Lidocaine 1% with epinephrine.  Wounds scrubbed with soap and water.  Wounds explored.  No foreign bodies or deep structure injury noted.  Forehead laceration closed with #10 simple interrupted sutures of 6-0 ethilon.  Nasal bridge laceration closed with #1 modified HM/purse string and #2 simple interrupted sutures (#3 total) all of 6-0 ethilon.  Area cleansed and dressed.  Pt tolerated very well.  Anticipate suture removal in 5 days (04/28/13)

## 2013-04-23 NOTE — Progress Notes (Signed)
   Subjective:    Patient ID: David Howard, male    DOB: 1923/08/31, 80 y.o.   MRN: [id number]  HPI    Review of Systems     Objective:   Physical Exam        Assessment & Plan:

## 2013-04-23 NOTE — Patient Instructions (Addendum)
Leave the bandaids in place for about 24 hours - after that wash the areas gently with soap and water, pat dry.  No ointments or creams. Leave the bandage on your hand in place for about 48-72 hours after - keep dry in the shower.  After that you can apply a thin layer of ointment and dressing until this is completely healed  WOUND CARE Please return in 5 days to have your stitches/staples removed or sooner if you have concerns. Marland Kitchen Keep area clean and dry for 24 hours. Do not remove bandage, if applied. . After 24 hours, remove bandage and wash wound gently with mild soap and warm water. Reapply a new bandage after cleaning wound, if directed. . Continue daily cleansing with soap and water until stitches/staples are removed. . Do not apply any ointments or creams to the wound while stitches/staples are in place, as this may cause delayed healing. . Notify the office if you experience any of the following signs of infection: Swelling, redness, pus drainage, streaking, fever >101.0 F . Notify the office if you experience excessive bleeding that does not stop after 15-20 minutes of constant, firm pressure.     Fall Prevention and Home Safety Falls cause injuries and can affect all age groups. It is possible to prevent falls.  HOW TO PREVENT FALLS  Wear shoes with rubber soles that do not have an opening for your toes.  Keep the inside and outside of your house well lit.  Use night lights throughout your home.  Remove clutter from floors.  Clean up floor spills.  Remove throw rugs or fasten them to the floor with carpet tape.  Do not place electrical cords across pathways.  Put grab bars by your tub, shower, and toilet. Do not use towel bars as grab bars.  Put handrails on both sides of the stairway. Fix loose handrails.  Do not climb on stools or stepladders, if possible.  Do not wax your floors.  Repair uneven or unsafe sidewalks, walkways, or stairs.  Keep items  you use a lot within reach.  Be aware of pets.  Keep emergency numbers next to the telephone.  Put smoke detectors in your home and near bedrooms. Ask your doctor what other things you can do to prevent falls. Document Released: 10/31/2008 Document Revised: 07/06/2011 Document Reviewed: 04/06/2011 [facility] Patient Information 2014 [place], [place]. Head Injury, Adult You have received a head injury. It does not appear serious at this time. Headaches and vomiting are common following head injury. It should be easy to awaken from sleeping. Sometimes it is necessary for you to stay in the emergency department for a while for observation. Sometimes admission to the hospital may be needed. After injuries such as yours, most problems occur within the first 24 hours, but side effects may occur up to 7 10 days after the injury. It is important for you to carefully monitor your condition and contact your health care provider or seek immediate medical care if there is a change in your condition. WHAT ARE THE TYPES OF HEAD INJURIES? Head injuries can be as minor as a bump. Some head injuries can be more severe. More severe head injuries include:  A jarring injury to the brain (concussion).  A bruise of the brain (contusion). This mean there is bleeding in the brain that can cause swelling.  A cracked skull (skull fracture).  Bleeding in the brain that collects, clots, and forms a bump (hematoma). WHAT CAUSES A HEAD INJURY? A  serious head injury is most likely to happen to someone who is in a car wreck and is not wearing a seat belt. Other causes of major head injuries include bicycle or motorcycle accidents, sports injuries, and falls. HOW ARE HEAD INJURIES DIAGNOSED? A complete history of the event leading to the injury and your current symptoms will be helpful in diagnosing head injuries. Many times, pictures of the brain, such as CT or MRI are needed to see the extent of the injury. Often, an  overnight hospital stay is necessary for observation.  WHEN SHOULD I SEEK IMMEDIATE MEDICAL CARE?  You should get help right away if:  You have confusion or drowsiness.  You feel sick to your stomach (nauseous) or have continued, forceful vomiting.  You have dizziness or unsteadiness that is getting worse.  You have severe, continued headaches not relieved by medicine. Only take over-the-counter or prescription medicines for pain, fever, or discomfort as directed by your health care provider.  You do not have normal function of the arms or legs or are unable to walk.  You notice changes in the black spots in the center of the colored part of your eye (pupil).  You have a clear or bloody fluid coming from your nose or ears.  You have a loss of vision. During the next 24 hours after the injury, you must stay with someone who can watch you for the warning signs. This person should contact local emergency services (911 in the [place] if you have seizures, you become unconscious, or you are unable to wake up. HOW CAN I PREVENT A HEAD INJURY IN THE FUTURE? The most important factor for preventing major head injuries is avoiding motor vehicle accidents. To minimize the potential for damage to your head, it is crucial to wear seat belts while riding in motor vehicles. Wearing helmets while bike riding and playing collision sports (like football) is also helpful. Also, avoiding dangerous activities around the house will further help reduce your risk of head injury.  WHEN CAN I RETURN TO NORMAL ACTIVITIES AND ATHLETICS? You should be reevaluated by your health care provider before returning to these activities. If you have any of the following symptoms, you should not return to activities or contact sports until 1 week after the symptoms have stopped:  Persistent headache.  Dizziness or vertigo.  Poor attention and concentration.  Confusion.  Memory problems.  Nausea or vomiting.  Fatigue  or tire easily.  Irritability.  Intolerant of bright lights or loud noises.  Anxiety or depression.  Disturbed sleep. MAKE SURE YOU:   Understand these instructions.  Will watch your condition.  Will get help right away if you are not doing well or get worse. Document Released: 01/04/2005 Document Revised: 10/25/2012 Document Reviewed: 09/11/2012 [facility] Patient Information 2014 [place].

## 2013-04-23 NOTE — Telephone Encounter (Signed)
Called spoke with spouse. She reports pt fell and hit his head this AM and thinks he may needs stitches. I advised her TP is not in and SN has no openings. Will need to be taken to UC or ED for eval. Nothing further needed

## 2013-04-23 NOTE — Telephone Encounter (Signed)
Spoke with patient and he was seen today and has taken tylenol like Dr. Elder Cyphers told him but no relief for neck pain. He would like to know what else he can take.

## 2013-04-23 NOTE — Telephone Encounter (Signed)
Pt calling again, needing to speak to nurse asap.David Howard

## 2013-04-23 NOTE — Telephone Encounter (Signed)
Since he has stopped the aspirin, it is ok for him to use Advil or Aleve for his neck pain

## 2013-04-24 ENCOUNTER — Ambulatory Visit (INDEPENDENT_AMBULATORY_CARE_PROVIDER_SITE_OTHER)
Admission: RE | Admit: 2013-04-24 | Discharge: 2013-04-24 | Disposition: A | Discharge status: Home / Self Care | Payer: Medicare Other | Source: Ambulatory Visit | Attending: Internal Medicine | Admitting: Internal Medicine

## 2013-04-24 ENCOUNTER — Encounter: Payer: Self-pay | Admitting: Internal Medicine

## 2013-04-24 ENCOUNTER — Telehealth: Payer: Self-pay

## 2013-04-24 ENCOUNTER — Ambulatory Visit (INDEPENDENT_AMBULATORY_CARE_PROVIDER_SITE_OTHER): Payer: Medicare Other | Admitting: Internal Medicine

## 2013-04-24 ENCOUNTER — Other Ambulatory Visit (INDEPENDENT_AMBULATORY_CARE_PROVIDER_SITE_OTHER): Payer: Medicare Other

## 2013-04-24 VITALS — BP 92/62 | HR 106 | Temp 97.6°F | Wt 186.2 lb

## 2013-04-24 DIAGNOSIS — M542 Cervicalgia: Secondary | ICD-10-CM

## 2013-04-24 DIAGNOSIS — I10 Essential (primary) hypertension: Secondary | ICD-10-CM

## 2013-04-24 DIAGNOSIS — R55 Syncope and collapse: Secondary | ICD-10-CM

## 2013-04-24 LAB — HEPATIC FUNCTION PANEL
ALBUMIN: 4 g/dL (ref 3.5–5.2)
ALT: 20 U/L (ref 0–53)
AST: 40 U/L — ABNORMAL HIGH (ref 0–37)
Alkaline Phosphatase: 42 U/L (ref 39–117)
Bilirubin, Direct: 0.2 mg/dL (ref 0.0–0.3)
TOTAL PROTEIN: 7.1 g/dL (ref 6.0–8.3)
Total Bilirubin: 1.3 mg/dL — ABNORMAL HIGH (ref 0.3–1.2)

## 2013-04-24 LAB — URINALYSIS, ROUTINE W REFLEX MICROSCOPIC
Hgb urine dipstick: NEGATIVE
Leukocytes, UA: NEGATIVE
Nitrite: NEGATIVE
Specific Gravity, Urine: 1.015 (ref 1.000–1.030)
Urine Glucose: NEGATIVE
Urobilinogen, UA: 0.2 (ref 0.0–1.0)
pH: 6 (ref 5.0–8.0)

## 2013-04-24 LAB — BASIC METABOLIC PANEL
BUN: 25 mg/dL — ABNORMAL HIGH (ref 6–23)
CO2: 28 mEq/L (ref 19–32)
Calcium: 9.4 mg/dL (ref 8.4–10.5)
Chloride: 97 mEq/L (ref 96–112)
Creatinine, Ser: 1.1 mg/dL (ref 0.4–1.5)
GFR: 64.48 mL/min (ref >60.00)
Glucose, Bld: 118 mg/dL — ABNORMAL HIGH (ref 70–99)
Potassium: 3.8 mEq/L (ref 3.5–5.1)
Sodium: 133 mEq/L — ABNORMAL LOW (ref 135–145)

## 2013-04-24 LAB — CBC WITH DIFFERENTIAL/PLATELET
Basophils Absolute: 0 10*3/uL (ref 0.0–0.1)
Basophils Relative: 0.1 % (ref 0.0–3.0)
EOS PCT: 0.8 % (ref 0.0–5.0)
Eosinophils Absolute: 0.1 10*3/uL (ref 0.0–0.7)
HEMATOCRIT: 39.6 % (ref 39.0–52.0)
Hemoglobin: 13.4 g/dL (ref 13.0–17.0)
LYMPHS ABS: 1.5 10*3/uL (ref 0.7–4.0)
Lymphocytes Relative: 14.1 % (ref 12.0–46.0)
MCHC: 33.7 g/dL (ref 30.0–36.0)
MCV: 89.1 fl (ref 78.0–100.0)
MONO ABS: 1.1 10*3/uL — AB (ref 0.1–1.0)
Monocytes Relative: 9.6 % (ref 3.0–12.0)
NEUTROS ABS: 8.2 10*3/uL — AB (ref 1.4–7.7)
Neutrophils Relative %: 75.4 % (ref 43.0–77.0)
Platelets: 194 10*3/uL (ref 150.0–400.0)
RBC: 4.44 Mil/uL (ref 4.22–5.81)
RDW: 13.1 % (ref 11.5–14.6)
WBC: 10.9 10*3/uL — AB (ref 4.5–10.5)

## 2013-04-24 LAB — TSH: TSH: 1.09 u[IU]/mL (ref 0.35–5.50)

## 2013-04-24 MED ORDER — HYDROCODONE-ACETAMINOPHEN 5-325 MG PO TABS: 1.0000 | ORAL_TABLET | Freq: Four times a day (QID) | ORAL | Status: DC | PRN

## 2013-04-24 NOTE — Assessment & Plan Note (Signed)
ECG reviewed as per emr - no significant changes; is Orthostatic on exam today, cant r/o neurological etiology,, but could be caused or exacerbated by the lisinopril-HCT as well though he is wanting to deny this, no recent diet change per pt or reduced po intake or n/v/d,  to stop the lisinopril HCT for now; but to continue the terazosin for now, but would stop this as well if any further dizziness.  Ok for some increased fluids and even small extra salt  In diet the next few days.  Avoid driving and heights for several days as well.  For lab eval (see orders) as well as Echocardiogram, carotid dopplers, Head MRI, card consult. No further bleeding, ok to re-start ASA tomorrow.

## 2013-04-24 NOTE — Patient Instructions (Addendum)
Your EKG was OK today  Please stop the lisinopril-HCT  OK to stop the terasozin if you have any further significant dizziness or falls  Please drink more fluids in the next 3 days, and ok to use a "bit" of extra salt as well for this time  Please take all new medication as prescribed - the hydrocodone as needed  Please go to the XRAY Department in the Basement (go straight as you get off the elevator) for the x-ray testing - the neck xray  Please go to the LAB in the Basement (turn left off the elevator) for the tests to be done today  You will be contacted by phone if any changes need to be made immediately.  Otherwise, you will receive a letter about your results with an explanation, but please check with MyChart first.  You will be contacted regarding the referral for: echocardiogram, carotid doppler testing, and Head MRI  Please keep your appointments with your specialists as you have planned - for the stitches out as you have planned at 7 days  Please keep your appointments with Dr Asa Lente as planned  Please keep your appointments with your specialists as you have planned - urology  You will be contacted regarding the referral for: cardiology  Please remember to sign up for MyChart if you have not done so, as this will be important to you in the future with finding out test results, communicating by private email, and scheduling acute appointments online when needed.

## 2013-04-24 NOTE — Telephone Encounter (Signed)
[facility] and they are faxing over result on this patient. Received fax and put on PCP's desk for review.

## 2013-04-24 NOTE — Progress Notes (Signed)
Pre visit review using our clinic review tool, if applicable. No additional management support is needed unless otherwise documented below in the visit note. 

## 2013-04-24 NOTE — Telephone Encounter (Signed)
Left message on machine to call back  

## 2013-04-24 NOTE — Assessment & Plan Note (Signed)
Ok for d/c lisinopril HCT for now, pt uncomfortable being off BP med completely, but has appt with Dr Asa Lente next week, can be re-asessed at that time.

## 2013-04-24 NOTE — Telephone Encounter (Signed)
[facility] called and needs to call a report in to the [place] as soon as possible.  Her callback number is [phone]- Name [place].

## 2013-04-24 NOTE — Progress Notes (Signed)
Subjective:    Patient ID: David Howard, male    DOB: December 14, 1923, 80 y.o.   MRN: [id number]  HPI  Here to f/u prior to establish with Dr Asa Lente (new PCP) due to episode yesterday. Just after getting up from bed, and stepping out of the shower became weak (not really dizzy), was able to sit on chair, then apparetnly had episode of syncope (no other prodrome), out for maybe 3-5 minutes per pt, and corroborates per wife who estimates the same time as she heard a noise but did not investigate for several minutes.  Pt states aware of sliding to the floor but then passed out completely.  No Cp, sob, palpitations.  Hit left head on something, and forehead on floor with laceration, much blood on the floor per wife, required 10 stitches to forehead, 3 stitches to nose.  Cranial xray neg in the Urgent Care.  Also skin tear to the right hand. When came to, was almost immediately aware of where he was, got up himself/crawled to bedroom, then able to get himself up in a chair.  No overt siezure activity, though wife did not seem him for about 3 min after the fall.  Seen at [facility], and advised for tylenol.  Went to work for 2 hrs this am even after a rough night of neck pain. Main complaint today is the neck pain, could not sleep well last PM despite collar and ice, cold and hot compresses not really working.  No UEor LE pain, weak, numb.  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  Wt loss 5 lbs in past yr. Takes tersozin at bedtime, as well as the finasteride. Is not taking flomax as has in the past  Denies urinary symptoms such as dysuria, frequency, urgency, flank pain, hematuria or n/v, fever, chills. No cough and Pt denies chest pain, increased sob or doe, wheezing, orthopnea, PND, increased LE swelling.  Due for  stitches out at 7 days Past Medical History  Diagnosis Date  . Unspecified essential hypertension   . Peripheral vascular disease, unspecified   . Other abnormal glucose   . Esophageal reflux   . Diverticulosis of colon (without mention of hemorrhage)   . Internal hemorrhoids without mention of complication   . Benign neoplasm of colon   . Inguinal hernia without mention of obstruction or gangrene, unilateral or unspecified, (not specified as recurrent)   . Elevated prostate specific antigen (PSA)   . Malignant neoplasm of bladder, part unspecified   . Spinal stenosis, unspecified region other than cervical   . Personal history of other lymphatic and hematopoietic neoplasm    Past Surgical History  Procedure Laterality Date  . Tonsillectomy    . Cataract extraction    . Laparoscopy    . Pilonidal cyst / sinus excision      reports that he has quit smoking. His smoking use included Cigarettes. He smoked 0.00 packs per day. He has never used smokeless tobacco. He reports that he drinks alcohol. He reports that he does not use illicit drugs. family history includes Kidney disease in his father. No Known Allergies Current Outpatient Prescriptions on File Prior to Visit  Medication Sig Dispense Refill  . aspirin 81 MG tablet Take 81 mg by mouth daily.        Marland Kitchen  Calcium Carbonate-Vitamin D (CALCIUM-VITAMIN D) 500-200 MG-UNIT per tablet Take 1 tablet by mouth 2 (two) times daily with a meal.       . Cholecalciferol (VITAMIN D3) 1000 UNITS CAPS Take 1 capsule by mouth daily.      . cyclobenzaprine (FLEXERIL) 10 MG tablet TAKE 1/2 TO 1 TAB 3 TIMES A DAY AS NEEDED FOR MUSCLE SPASM.  90 tablet  1  . cyproheptadine (PERIACTIN) 4 MG tablet TAKE AS DIRECTED.  30 tablet  5  . finasteride (PROSCAR) 5 MG tablet Take 5 mg by mouth daily.        . hydrOXYzine (ATARAX/VISTARIL) 25 MG tablet Take 1 tablet (25 mg total) by mouth 3 (three) times daily as needed for itching. Pt is requesting that this  medication be delivered.  90 tablet  5  . lisinopril-hydrochlorothiazide (PRINZIDE,ZESTORETIC) 20-12.5 MG per tablet TAKE 1 TABLET DAILY.  30 tablet  1  . Multiple Vitamin (MULTIVITAMIN) capsule Take 1 capsule by mouth daily.        Marland Kitchen terazosin (HYTRIN) 5 MG capsule TAKE (1) CAPSULE DAILY.  30 capsule  11  . triamcinolone (KENALOG) 0.5 % cream Apply 1 application topically 2 (two) times daily.         No current facility-administered medications on file prior to visit.   Review of Systems  Constitutional: Negative for unexpected weight change, or unusual diaphoresis  HENT: Negative for tinnitus.   Eyes: Negative for photophobia and visual disturbance.  Respiratory: Negative for choking and stridor.   Gastrointestinal: Negative for vomiting and blood in stool.  Genitourinary: Negative for hematuria and decreased urine volume.  Musculoskeletal: Negative for acute joint swelling Skin: Negative for color change and wound.  Neurological: Negative for tremors and numbness other than noted  Psychiatric/Behavioral: Negative for decreased concentration or  hyperactivity.  '    Objective:   Physical Exam BP 92/62  Pulse 106  Temp(Src) 97.6 F (36.4 C) (Oral)  Wt 186 lb 4 oz (84.482 kg)  SpO2 95%  + Orthostatic BP noted VS noted, cognitively sharp for age, appears mild to mod distress with neck pain in collar Constitutional: Pt appears well-developed and well-nourished.  HENT: Head: NCAT. Occiput NT Right Ear: External ear normal.  Left Ear: External ear normal.  Eyes: Conjunctivae and EOM are normal. Pupils are equal, round, and reactive to light.  Neck: Mod Decreased range of motion to flexion/ext/horizontal movement. Neck stiff. but tender low mid cervical and left trapezoid, bruising erythema swelling subtle but present right paracervical  Cardiovascular: Normal rate and regular rhythm.   Pulmonary/Chest: Effort normal and breath sounds normal.  Abd:  Soft, NT, non-distended, +  BS Neurological: Pt is alert. Not confused , motor 5/5 throughout Skin: Skin is warm. No erythema. No edema Psychiatric: Pt behavior is normal. Thought content normal.      Assessment & Plan:

## 2013-04-24 NOTE — Assessment & Plan Note (Signed)
Suspect severe strain/pain exac related to underlying prob djd or DDD, for hydrocodone prn, ok to cont the collar for several more days, hopefully to better sleep tonight, for plain film - r/o fx  Note:  Total time for pt hx, exam, review of record with pt in the room, determination of diagnoses and plan for further eval and tx is > 40 min, with over 50% spent in coordination and counseling of patient

## 2013-04-25 ENCOUNTER — Other Ambulatory Visit (HOSPITAL_COMMUNITY): Payer: Self-pay | Admitting: *Deleted

## 2013-04-25 DIAGNOSIS — R55 Syncope and collapse: Secondary | ICD-10-CM

## 2013-04-25 NOTE — Telephone Encounter (Signed)
Pt notified. He spoke to his PCP and they gave him Tramadol

## 2013-04-26 ENCOUNTER — Telehealth: Payer: Self-pay

## 2013-04-26 MED ORDER — CYCLOBENZAPRINE HCL 10 MG PO TABS: ORAL_TABLET | ORAL | Status: DC

## 2013-04-26 NOTE — Telephone Encounter (Signed)
The patient is having constant neck pain due to fall taken this past weekend.  He states tramadol is not working and he does not want to take the hydrocodone Dr. Jenny Reichmann prescribed. (1) If he take Aleve for the pain how much and how often?? (2) Is it ok to take 81mg  ASA along with the aleve? (3)  Is it ok to alternate heat and ice?Marland Kitchen  Advise as the patient states overall he is having no other pain from fall, but constant neck pain. Call back number [phone].

## 2013-04-26 NOTE — Telephone Encounter (Signed)
Called informed the patient of MD instructions. 

## 2013-04-26 NOTE — Telephone Encounter (Signed)
Would not recommend any of his suggestions as not likely to work and has increased risk of side effect with alleve at his age to include stomach ulcer and bleeding, or even renal failure  Ok for muscle relaxer prn - done erx, but watch for sedation on the higher dose 10 mg flexeril

## 2013-04-28 ENCOUNTER — Ambulatory Visit (HOSPITAL_COMMUNITY)
Admission: RE | Admit: 2013-04-28 | Discharge: 2013-04-28 | Disposition: A | Discharge status: Home / Self Care | Payer: Medicare Other | Source: Ambulatory Visit | Attending: Physician Assistant | Admitting: Physician Assistant

## 2013-04-28 ENCOUNTER — Ambulatory Visit (INDEPENDENT_AMBULATORY_CARE_PROVIDER_SITE_OTHER): Payer: PRIVATE HEALTH INSURANCE | Admitting: Physician Assistant

## 2013-04-28 ENCOUNTER — Telehealth: Payer: Self-pay | Admitting: Physician Assistant

## 2013-04-28 ENCOUNTER — Emergency Department (HOSPITAL_COMMUNITY)
Admission: EM | Admit: 2013-04-28 | Discharge: 2013-04-28 | Disposition: A | Discharge status: Home / Self Care | Payer: Medicare Other | Attending: Emergency Medicine | Admitting: Emergency Medicine

## 2013-04-28 ENCOUNTER — Encounter (HOSPITAL_COMMUNITY): Payer: Self-pay | Admitting: Emergency Medicine

## 2013-04-28 VITALS — BP 128/64 | HR 105 | Temp 97.9°F | Ht 69.0 in | Wt 190.0 lb

## 2013-04-28 DIAGNOSIS — W19XXXA Unspecified fall, initial encounter: Secondary | ICD-10-CM | POA: Insufficient documentation

## 2013-04-28 DIAGNOSIS — M47812 Spondylosis without myelopathy or radiculopathy, cervical region: Secondary | ICD-10-CM | POA: Insufficient documentation

## 2013-04-28 DIAGNOSIS — Z4802 Encounter for removal of sutures: Secondary | ICD-10-CM

## 2013-04-28 DIAGNOSIS — Y929 Unspecified place or not applicable: Secondary | ICD-10-CM | POA: Insufficient documentation

## 2013-04-28 DIAGNOSIS — R9389 Abnormal findings on diagnostic imaging of other specified body structures: Secondary | ICD-10-CM

## 2013-04-28 DIAGNOSIS — Z87898 Personal history of other specified conditions: Secondary | ICD-10-CM | POA: Insufficient documentation

## 2013-04-28 DIAGNOSIS — IMO0002 Reserved for concepts with insufficient information to code with codable children: Secondary | ICD-10-CM | POA: Insufficient documentation

## 2013-04-28 DIAGNOSIS — K219 Gastro-esophageal reflux disease without esophagitis: Secondary | ICD-10-CM | POA: Insufficient documentation

## 2013-04-28 DIAGNOSIS — S12100A Unspecified displaced fracture of second cervical vertebra, initial encounter for closed fracture: Secondary | ICD-10-CM | POA: Insufficient documentation

## 2013-04-28 DIAGNOSIS — Y939 Activity, unspecified: Secondary | ICD-10-CM | POA: Insufficient documentation

## 2013-04-28 DIAGNOSIS — Z87891 Personal history of nicotine dependence: Secondary | ICD-10-CM | POA: Insufficient documentation

## 2013-04-28 DIAGNOSIS — I10 Essential (primary) hypertension: Secondary | ICD-10-CM | POA: Insufficient documentation

## 2013-04-28 DIAGNOSIS — Z7982 Long term (current) use of aspirin: Secondary | ICD-10-CM | POA: Insufficient documentation

## 2013-04-28 DIAGNOSIS — S12110A Anterior displaced Type II dens fracture, initial encounter for closed fracture: Secondary | ICD-10-CM

## 2013-04-28 DIAGNOSIS — M542 Cervicalgia: Secondary | ICD-10-CM

## 2013-04-28 DIAGNOSIS — Z8551 Personal history of malignant neoplasm of bladder: Secondary | ICD-10-CM | POA: Insufficient documentation

## 2013-04-28 DIAGNOSIS — Z8739 Personal history of other diseases of the musculoskeletal system and connective tissue: Secondary | ICD-10-CM | POA: Insufficient documentation

## 2013-04-28 DIAGNOSIS — Z79899 Other long term (current) drug therapy: Secondary | ICD-10-CM | POA: Insufficient documentation

## 2013-04-28 MED ORDER — HYDROCODONE-ACETAMINOPHEN 5-325 MG PO TABS
2.0000 | ORAL_TABLET | Freq: Once | ORAL | Status: AC
  Administered 2013-04-28: 2 via ORAL
  Filled 2013-04-28: qty 2

## 2013-04-28 NOTE — ED Provider Notes (Signed)
CSN: [id number]     Arrival date & time 04/28/13  1202 History   First MD Initiated Contact with Patient 04/28/13 1211     Chief Complaint  Patient presents with  . Neck Injury     (Consider location/radiation/quality/duration/timing/severity/associated sxs/prior Treatment) HPI Comments: Patient presents to the ED with a chief complaint of neck pain.  He was seen early in the week had an urgent care after a fall in the bathroom. Patient had a CT scan of his head which is negative. He followed up with his primary care Dr., and received plain films of the neck which were negative. Today, he went back to his doctor's office to have some stitches removed, and reports still having severe neck pain. At this time, it was recommended the patient have a CT of the cervical spine, which showed a mildly displaced odontoid fracture. He was referred to the emergency department for further evaluation and care. He complains of neck pain which is moderate in severity. It is relieved with taking hydrocodone. He denies any weakness in his arms or his legs. He has been ambulating at baseline. Denies bowel or bladder incontinence.  The history is provided by the patient. No language interpreter was used.    Past Medical History  Diagnosis Date  . Unspecified essential hypertension   . Peripheral vascular disease, unspecified   . Other abnormal glucose   . Esophageal reflux   . Diverticulosis of colon (without mention of hemorrhage)   . Internal hemorrhoids without mention of complication   . Benign neoplasm of colon   . Inguinal hernia without mention of obstruction or gangrene, unilateral or unspecified, (not specified as recurrent)   . Elevated prostate specific antigen (PSA)   . Malignant neoplasm of bladder, part unspecified   . Spinal stenosis, unspecified region other than cervical   . Personal history of other lymphatic and hematopoietic neoplasm    Past Surgical History  Procedure Laterality Date   . Tonsillectomy    . Cataract extraction    . Laparoscopy    . Pilonidal cyst / sinus excision     Family History  Problem Relation Age of Onset  . Kidney disease Father    History  Substance Use Topics  . Smoking status: Former Smoker    Types: Cigarettes  . Smokeless tobacco: Never Used  . Alcohol Use: Yes     Comment: social use    Review of Systems  Constitutional: Negative for fever and chills.  Respiratory: Negative for shortness of breath.   Cardiovascular: Negative for chest pain.  Gastrointestinal: Negative for nausea, vomiting, diarrhea and constipation.  Genitourinary: Negative for dysuria.  Neurological: Negative for weakness and numbness.  Hematological: Bruises/bleeds easily.      Allergies  Review of patient's allergies indicates no known allergies.  Home Medications   Current Outpatient Rx  Name  Route  Sig  Dispense  Refill  . aspirin 81 MG tablet   Oral   Take 81 mg by mouth daily.           . Calcium Carbonate-Vitamin D (CALCIUM-VITAMIN D) 500-200 MG-UNIT per tablet   Oral   Take 1 tablet by mouth 2 (two) times daily with a meal.          . Cholecalciferol (VITAMIN D3) 1000 UNITS CAPS   Oral   Take 1 capsule by mouth daily.         . cyclobenzaprine (FLEXERIL) 10 MG tablet      TAKE 1/2  TO 1 TAB 3 TIMES A DAY AS NEEDED FOR MUSCLE SPASM.   90 tablet   0   . cyproheptadine (PERIACTIN) 4 MG tablet      TAKE AS DIRECTED.   30 tablet   5   . finasteride (PROSCAR) 5 MG tablet   Oral   Take 5 mg by mouth daily.           Marland Kitchen HYDROcodone-acetaminophen (NORCO/VICODIN) 5-325 MG per tablet   Oral   Take 1 tablet by mouth every 6 (six) hours as needed for moderate pain.   40 tablet   0   . hydrOXYzine (ATARAX/VISTARIL) 25 MG tablet   Oral   Take 1 tablet (25 mg total) by mouth 3 (three) times daily as needed for itching. Pt is requesting that this medication be delivered.   90 tablet   5   . Multiple Vitamin (MULTIVITAMIN)  capsule   Oral   Take 1 capsule by mouth daily.           Marland Kitchen terazosin (HYTRIN) 5 MG capsule      TAKE (1) CAPSULE DAILY.   30 capsule   11   . triamcinolone (KENALOG) 0.5 % cream   Topical   Apply 1 application topically 2 (two) times daily.            BP 131/82  Pulse 96  Temp(Src) 97.7 F (36.5 C) (Oral)  Resp 18  SpO2 97% Physical Exam  Nursing note and vitals reviewed. Constitutional: He is oriented to person, place, and time. He appears well-developed and well-nourished.  Patient currently in a soft collar  HENT:  Head: Normocephalic and atraumatic.  Eyes: Conjunctivae and EOM are normal. Pupils are equal, round, and reactive to light. Right eye exhibits no discharge. Left eye exhibits no discharge. No scleral icterus.  Neck: Normal range of motion. Neck supple. No JVD present.  Cardiovascular: Normal rate, regular rhythm and normal heart sounds.  Exam reveals no gallop and no friction rub.   No murmur heard. Pulmonary/Chest: Effort normal and breath sounds normal. No respiratory distress. He has no wheezes. He has no rales. He exhibits no tenderness.  Abdominal: Soft. He exhibits no distension and no mass. There is no tenderness. There is no rebound and no guarding.  Musculoskeletal: Normal range of motion. He exhibits no edema and no tenderness.  Range of motion strength 5/5 in upper and lower extremities  Neurological: He is alert and oriented to person, place, and time.  Sensation and strength intact  Skin: Skin is warm and dry.  Scattered bruising on the arms , hands, and face from prior fall, appeared be well healing  Psychiatric: He has a normal mood and affect. His behavior is normal. Judgment and thought content normal.    ED Course  Procedures (including critical care time) Labs Review Labs Reviewed - No data to display Imaging Review Ct Cervical Spine Wo Contrast  04/28/2013   CLINICAL DATA:  [place] 4 days ago.  Neck pain.  EXAM: CT CERVICAL SPINE  WITHOUT CONTRAST  TECHNIQUE: Multidetector CT imaging of the cervical spine was performed without intravenous contrast. Multiplanar CT image reconstructions were also generated.  COMPARISON:  Cervical radiographs, 04/24/2013.  FINDINGS: There is a type 2 odontoid fracture. This is minimally displaced posteriorly by 2 mm. There is no significant comminution.  No other fractures. There are extensive degenerative changes throughout the cervical spine as well as diffuse bony demineralization are varying degrees of neural foraminal narrowing due to  uncovertebral facet spurring. Cervical spine is curved, convex the right fell which may be positional or fixed.  Soft tissue show carotid vascular calcifications but are otherwise unremarkable. Lung apices are clear.  IMPRESSION: 1. Type 2 odontoid fracture, minimally displaced posteriorly by 2 mm. 2. No other fractures.  No other acute findings. 3. Extensive degenerative changes and diffuse bony demineralization.   Electronically Signed   By: Lajean Manes M.D.   On: 04/28/2013 10:58     EKG Interpretation None      MDM   Final diagnoses:  Odontoid fracture    High functioning 80 year old male with a slightly displaced odontoid fracture. Patient seen by and discussed with Dr. Mingo Amber. Will consult neurosurgery. Will place patient in an [place]en collar.  1:16 PM Patient is neurovascularly intact. I discussed patient with Dr. Annette Stable, from neurosurgery, who recommends placing the patient an Aspen collar, and discharging her home. Dr. Annette Stable will see the patient and his office early next week. The patient's wife asks about a walker in case the patient feels unstable while taking pain medication.  I will write a prescription for a walker.  Discussed the patient with Dr. Mingo Amber, who agrees with the plan.    Montine Circle, PA-C 04/28/13 1317

## 2013-04-28 NOTE — ED Provider Notes (Signed)
Medical screening examination/treatment/procedure(s) were conducted as a shared visit with non-physician practitioner(s) and myself.  I personally evaluated the patient during the encounter.   EKG Interpretation None       Patient here with C2 odontoid fracture with 15mm of displacement. Seen earlier in the week for a fall, had CT c-spine done by [facility], then sent here for further eval. Has no neurologic deficits, he is a fully functional accountant, lives at home. Dr. Annette Stable w/ Neurosurgery consulted - will f/u in clinic. Placed in [place] collar, discharged home.  Osvaldo Shipper, MD 04/28/13 1425

## 2013-04-28 NOTE — Discharge Instructions (Signed)
Cervical Spine Fracture, Stable  A cervical spine fracture is a break or crack in one of the bones of the neck. A fracture is stable if the chances of it causing you problems while it is healing are very small.  CAUSES   · Vehicle accidents.  · Injuries from sports such as diving, football, biking, wrestling, or skiing.  · Occasionally, severe osteoporosis or other bone diseases, such cancers that spread to bone or metabolic abnormalities.  SYMPTOMS   · Severe neck pain after an accident or fall.  · Pain down your shoulders or arms.  · Bruising or swelling on the back of your neck.  · Numbness, tingling, muscle spasm, or weakness.  DIAGNOSIS   Cervical spine fracture is diagnosed with the help of X-ray exams of your neck. Often a CT scan or MRI is used to confirm the diagnosis and help determine how your injury should be treated. Generally, an examination of your neck, arms and legs, and the history of your injury prompts the health care provider to order these tests.   TREATMENT   A stable fracture needs to be stabilized with a brace or cervical collar. A cervical collar is a two-piece collar designed to keep your neck from moving during the healing process.  HOME CARE INSTRUCTIONS  · Limit physical activity to prevent worsening of the fracture.  · Apply ice to areas of pain 3 4 times a day for 2 days.  · Put ice in a bag.  · Place a towel between your skin and the bag.  · Leave the ice on for 15 20 minutes, 3 4 times a day.  · You may have been given a cervical collar to wear.  · Do not remove the collar unless instructed by your health care provider.  · If you have long hair, keep it outside of the collar.  · Ask your health care provider before making any adjustments to your collar. Minor adjustments  may be required over time to improve comfort and reduce pressure on your chin or on back of your head.  · Keep your collar clean by wiping it with mild soap and water and drying it completely. The pads can be hand  washed with soap and water and air dried completely.  · If you are allowed to remove the collar for cleaning or bathing, follow your health care provider's instructions on how to do so safely.  · If you are allowed to remove the collar for cleaning and bathing, wash and dry the skin of your neck. Check your skin for irritation or sores. If you see any, tell your health care provider.  · Only take over-the-counter or prescription medicines for pain, discomfort, or fever as directed by your health care provider.    · Keep all follow-up appointments as directed by your health care provider. Not keeping an appointment could result in a chronic or permanent injury, pain, and disability. Additionally, X-rays or an MRI may be repeated 1 3 weeks after your initial appointment. This is to:  · Make sure any other breaks or cracks were not missed.    · Help identify stretched or torn ligaments.    · Get your test results if you did not get them when you were first evaluated. The results will determine whether you need other tests or treatment. It is your responsibility to get the results.  SEEK MEDICAL CARE IF:  You have irritation or sores on your skin from the cervical collar.  SEEK IMMEDIATE   MEDICAL CARE IF:   · You have increasing pain in your neck.    · You develop difficulties swallowing or breathing.  · You develop swelling in your neck.    · You have numbness, weakness, burning pain, or movement problems in the arms or legs.    · You are unable to control your bowel or bladder (incontinence).    · You have problems with coordination or difficulty walking.  MAKE SURE YOU:   · Understand these instructions.  · Will watch your condition.  · Will get help right away if you are not doing well or get worse.  Document Released: 11/22/2003 Document Revised: 09/06/2012 Document Reviewed: 07/31/2012  Exit[employer]® Patient Information ©2014 [employer].

## 2013-04-28 NOTE — Telephone Encounter (Signed)
Opened in error

## 2013-04-28 NOTE — Patient Instructions (Signed)
-  CT of your neck at [facility] to admitting and register -I will call you with the results today  Suture Removal, Care After Refer to this sheet in the next few weeks. These instructions provide you with information on caring for yourself after your procedure. Your health care provider may also give you more specific instructions. Your treatment has been planned according to current medical practices, but problems sometimes occur. Call your health care provider if you have any problems or questions after your procedure. WHAT TO EXPECT AFTER THE PROCEDURE After your stitches (sutures) are removed, it is typical to have the following:  Some discomfort and swelling in the wound area.  Slight redness in the area. HOME CARE INSTRUCTIONS   If you have skin adhesive strips over the wound area, do not take the strips off. They will fall off on their own in a few days. If the strips remain in place after 14 days, you may remove them.  Change any bandages (dressings) at least once a day or as directed by your health care provider. If the bandage sticks, soak it off with warm, soapy water.  Apply cream or ointment only as directed by your health care provider. If using cream or ointment, wash the area with soap and water 2 times a day to remove all the cream or ointment. Rinse off the soap and pat the area dry with a clean towel.  Keep the wound area dry and clean. If the bandage becomes wet or dirty, or if it develops a bad smell, change it as soon as possible.  Continue to protect the wound from injury.  Use sunscreen when out in the sun. New scars become sunburned easily. SEEK MEDICAL CARE IF:  You have increasing redness, swelling, or pain in the wound.  You see pus coming from the wound.  You have a fever.  You notice a bad smell coming from the wound or dressing.  Your wound breaks open (edges not staying together). Document Released: 09/29/2000 Document Revised: 10/25/2012  Document Reviewed: 08/16/2012 [facility] Patient Information 2014 [place].

## 2013-04-28 NOTE — Progress Notes (Signed)
Subjective:    Patient ID: David Howard, male    DOB: 09/19/1923, 80 y.o.   MRN: [id number]  HPI Primary Physician: Cathlean Cower, MD  Chief Complaint: Suture removal  HPI: 80 y.o. male with history below presents for suture removal. Patient with a fall in the shower on 04/23/13 causing a vertical laceration to the midline of his forehead and a laceration to his nasal bridge. Had primary repair to include 10 SI on the forehead wound, 2 SI and 1 modified purse string on the nasal bridge wound on 04/23/13. Patient had a plain film of his skull without acute osseous abnormalities on 04/23/13. Patient was given tramadol for prn usage of pain. He followed up with Dr. Cathlean Cower on 04/24/13 to establish with [place] [facility] and had a cervical spine series that had an impression read as: 1. Markedly degraded examination with age-indeterminate mild anterolisthesis of C4 upon C5 measuring approximately 4 mm. If there is concern for a cervical spine fracture, further evaluation with cervical spine CT is recommended. 2. Moderate to severe DDD throughout the cervical spine. These results will be called to the ordering clinician or representative by the Radiologist Assistant, and communication documented in the PACS Dashboard. He was given Norco 5/325 mg 1 po q 6 hours for prn neck pain and advised to stop the tramadol. The Norco is not touching the neck pain at all. He is not sleeping secondary to the pain. He is wearing a soft cervical collar. He denies any paresthesias in the extremities or any weakness in the extremities.   He was advised to not take his antihypertensives at his visit with Dr. Jenny Reichmann secondary to being orthostatic on exam at that time. Patient did take his antihypertensives on the evening of 04/27/13. Both he and his wife are asking what to do about them. He has not had any further syncopal episodes.   Wife is here in the room with him.   Past Medical History  Diagnosis Date  . Unspecified essential  hypertension   . Peripheral vascular disease, unspecified   . Other abnormal glucose   . Esophageal reflux   . Diverticulosis of colon (without mention of hemorrhage)   . Internal hemorrhoids without mention of complication   . Benign neoplasm of colon   . Inguinal hernia without mention of obstruction or gangrene, unilateral or unspecified, (not specified as recurrent)   . Elevated prostate specific antigen (PSA)   . Malignant neoplasm of bladder, part unspecified   . Spinal stenosis, unspecified region other than cervical   . Personal history of other lymphatic and hematopoietic neoplasm      Home Meds: Prior to Admission medications   Medication Sig Start Date End Date Taking? Authorizing Provider  aspirin 81 MG tablet Take 81 mg by mouth daily.     Yes Historical Provider, MD  Calcium Carbonate-Vitamin D (CALCIUM-VITAMIN D) 500-200 MG-UNIT per tablet Take 1 tablet by mouth 2 (two) times daily with a meal.    Yes Historical Provider, MD  Cholecalciferol (VITAMIN D3) 1000 UNITS CAPS Take 1 capsule by mouth daily.   Yes Historical Provider, MD  cyclobenzaprine (FLEXERIL) 10 MG tablet TAKE 1/2 TO 1 TAB 3 TIMES A DAY AS NEEDED FOR MUSCLE SPASM. 04/26/13  Yes Biagio Borg, MD  cyproheptadine (PERIACTIN) 4 MG tablet TAKE AS DIRECTED. 06/30/12  Yes Noralee Space, MD  finasteride (PROSCAR) 5 MG tablet Take 5 mg by mouth daily.     No Historical Provider, MD  HYDROcodone-acetaminophen (  NORCO/VICODIN) 5-325 MG per tablet Take 1 tablet by mouth every 6 (six) hours as needed for moderate pain. 04/24/13  Yes Biagio Borg, MD  hydrOXYzine (ATARAX/VISTARIL) 25 MG tablet Take 1 tablet (25 mg total) by mouth 3 (three) times daily as needed for itching. Pt is requesting that this medication be delivered. 08/11/12  Yes Noralee Space, MD  Multiple Vitamin (MULTIVITAMIN) capsule Take 1 capsule by mouth daily.     Yes Historical Provider, MD  terazosin (HYTRIN) 5 MG capsule TAKE (1) CAPSULE DAILY. 05/05/10  Yes  Noralee Space, MD  triamcinolone (KENALOG) 0.5 % cream Apply 1 application topically 2 (two) times daily.     Yes Historical Provider, MD    Allergies: No Known Allergies  History   Social History  . Marital Status: Married    Spouse Name: [name]    Number of Children: N/A  . Years of Education: N/A   Occupational History  . retired   . CPA    Social History Main Topics  . Smoking status: Former Smoker    Types: Cigarettes  . Smokeless tobacco: Never Used  . Alcohol Use: Yes     Comment: social use  . Drug Use: No  . Sexual Activity: Not on file   Other Topics Concern  . Not on file   Social History Narrative  . No narrative on file     Review of Systems  Constitutional: Negative for fever and chills.  Musculoskeletal: Positive for neck pain.  Skin: Positive for color change and wound.       Bruising along the face.  Laceration along the forehead and nasal bridge.   Neurological: Positive for headaches. Negative for dizziness, tremors, syncope, speech difficulty, weakness and numbness.       Objective:   Physical Exam  Physical Exam: Blood pressure 128/64, pulse 105, temperature 97.9 F (36.6 C), temperature source Oral, height 5\' 9"  (1.753 m), weight 190 lb (86.183 kg), SpO2 97.00%., Body mass index is 28.05 kg/(m^2). General: Well developed, well nourished, in no acute distress. Head: Normocephalic, atraumatic, eyes without discharge, sclera non-icteric, nares are without discharge.    Neck: Wearing soft collar.  Lungs: Breathing is unlabored. Heart: Regular rate. Msk:  Strength and tone normal for age. Extremities/Skin: Warm and dry. No clubbing or cyanosis. No edema. No rashes or suspicious lesions. Multiple resolving bruises along the face. Well healed vertical laceration along midline of the forehead. Well healed laceration along the nasal bridge. No signs of secondary infection. No TTP. No drainage. No erythema. Well healing abrasion on the dorsal  surface of the right hand. Wound washed and dressed.  Neuro: Alert and oriented X 3. Moves all extremities spontaneously. Gait is normal. CNII-XII grossly in tact. Psych:  Responds to questions appropriately with a normal affect.        Assessment & Plan:  80 year old male here for suture removal with neck pain  1) Suture removal -Wounds well healed -10 sutures removed from forehead laceration -3 sutures removed form nasal bridge laceration -Wound care  2) Neck pain -CT cervical spine with call report -Continue cervical collar -Further pain management pending imaging results   3) Has scheduled follow up with new PCP Dr. Asa Lente 05/02/13 at 3:30 pm, keep appointment -Continue treatment plan as outlined by current PCP -Continue to hold antihypertensives until recheck on 05/02/13 as directed by Dr. Jenny Reichmann -I advised both the patient and his wife that his PCP, or his PCP's office  needs to be the one making blood pressure medication adjustments. They understand this. I offered them to monitor they blood pressure at home over the weekend, they do have a cuff at home. I offered for them to call the office and speak with the MD. I advised them to follow up as directed. They agree to this.    Christell Faith, MHS, PA-C [facility] [place], [place] [address] [facility] 04/28/2013 9:55 AM

## 2013-04-28 NOTE — ED Notes (Signed)
Bed: WA20 Expected date:  Expected time:  Means of arrival:  Comments: Hold for Tommye Standard

## 2013-04-28 NOTE — ED Notes (Addendum)
Pt fell on Monday and seen Urgent care.  Xray negative and pt rec'd stitches in forehead.  Pt then seen to have stitches removed and told to have CT.  CT done this am and pt called and notified to come to ED.     Fracture in neck. Pt had home neck brace and is wearing on arrival.

## 2013-04-30 ENCOUNTER — Other Ambulatory Visit (HOSPITAL_COMMUNITY): Payer: Medicare Other

## 2013-05-02 ENCOUNTER — Encounter: Payer: Self-pay | Admitting: Internal Medicine

## 2013-05-02 ENCOUNTER — Ambulatory Visit (INDEPENDENT_AMBULATORY_CARE_PROVIDER_SITE_OTHER): Payer: Medicare Other | Admitting: Internal Medicine

## 2013-05-02 VITALS — BP 132/90 | HR 82 | Temp 97.6°F | Ht 69.0 in | Wt 185.4 lb

## 2013-05-02 DIAGNOSIS — R55 Syncope and collapse: Secondary | ICD-10-CM

## 2013-05-02 DIAGNOSIS — S12110A Anterior displaced Type II dens fracture, initial encounter for closed fracture: Secondary | ICD-10-CM

## 2013-05-02 DIAGNOSIS — I10 Essential (primary) hypertension: Secondary | ICD-10-CM

## 2013-05-02 DIAGNOSIS — S12100A Unspecified displaced fracture of second cervical vertebra, initial encounter for closed fracture: Secondary | ICD-10-CM

## 2013-05-02 NOTE — Progress Notes (Signed)
Subjective:    Patient ID: David Howard, male    DOB: Mar 20, 1923, 80 y.o.   MRN: [id number]  HPI  Patient here for follow up - syncope event x 1 04/23/13 prompting ER eval for same Reviewed chronic medical issues and interval medical events  Past Medical History  Diagnosis Date  . Unspecified essential hypertension   . Peripheral vascular disease, unspecified   . Other abnormal glucose   . Esophageal reflux   . Diverticulosis of colon (without mention of hemorrhage)   . Internal hemorrhoids without mention of complication   . Benign neoplasm of colon   . Inguinal hernia without mention of obstruction or gangrene, unilateral or unspecified, (not specified as recurrent)   . Elevated prostate specific antigen (PSA)   . Malignant neoplasm of bladder, part unspecified   . Spinal stenosis, unspecified region other than cervical   . Personal history of other lymphatic and hematopoietic neoplasm     Review of Systems  Constitutional: Positive for fatigue. Negative for fever and unexpected weight change.  Respiratory: Negative for cough and shortness of breath.   Cardiovascular: Negative for chest pain and leg swelling.  Musculoskeletal: Positive for myalgias. Negative for gait problem, joint swelling and neck pain (since stabilization in rigid collar x 24h).  Neurological: Negative for dizziness, facial asymmetry, speech difficulty, weakness, light-headedness and headaches. Syncope: see HPI.       Objective:   Physical Exam  BP 132/90  Pulse 82  Temp(Src) 97.6 F (36.4 C) (Oral)  Ht 5\' 9"  (1.753 m)  Wt 185 lb 6.4 oz (84.097 kg)  BMI 27.37 kg/m2  SpO2 97% Wt Readings from Last 3 Encounters:  05/02/13 185 lb 6.4 oz (84.097 kg)  04/28/13 190 lb (86.183 kg)  04/24/13 186 lb 4 oz (84.482 kg)   Constitutional: he is in rigid cervical collar, obese, but appears well-developed and well-nourished. No distress. Wife Zelda at side Cardiovascular: Normal rate, regular rhythm and  normal heart sounds.  No murmur heard. No BLE edema. Pulmonary/Chest: Effort normal and breath sounds normal. No respiratory distress. he has no wheezes.  Skin: fading ecchymosis face, arms  Psychiatric: he has a normal mood and affect. His behavior is normal. Judgment and thought content normal.   Lab Results  Component Value Date   WBC 10.9* 04/24/2013   HGB 13.4 04/24/2013   HCT 39.6 04/24/2013   PLT 194.0 04/24/2013   GLUCOSE 118* 04/24/2013   CHOL 176 07/11/2012   TRIG 97.0 07/11/2012   HDL 68.20 07/11/2012   LDLCALC 88 07/11/2012   ALT 20 04/24/2013   AST 40* 04/24/2013   NA 133* 04/24/2013   K 3.8 04/24/2013   CL 97 04/24/2013   CREATININE 1.1 04/24/2013   BUN 25* 04/24/2013   CO2 28 04/24/2013   TSH 1.09 04/24/2013   PSA 3.21 07/02/2008    Ct Cervical Spine Wo Contrast  04/28/2013   CLINICAL DATA:  [place] 4 days ago.  Neck pain.  EXAM: CT CERVICAL SPINE WITHOUT CONTRAST  TECHNIQUE: Multidetector CT imaging of the cervical spine was performed without intravenous contrast. Multiplanar CT image reconstructions were also generated.  COMPARISON:  Cervical radiographs, 04/24/2013.  FINDINGS: There is a type 2 odontoid fracture. This is minimally displaced posteriorly by 2 mm. There is no significant comminution.  No other fractures. There are extensive degenerative changes throughout the cervical spine as well as diffuse bony demineralization are varying degrees of neural foraminal narrowing due to uncovertebral facet spurring. Cervical spine is curved,  convex the right fell which may be positional or fixed.  Soft tissue show carotid vascular calcifications but are otherwise unremarkable. Lung apices are clear.  IMPRESSION: 1. Type 2 odontoid fracture, minimally displaced posteriorly by 2 mm. 2. No other fractures.  No other acute findings. 3. Extensive degenerative changes and diffuse bony demineralization.   Electronically Signed   By: Lajean Manes M.D.   On: 04/28/2013 10:58       Assessment & Plan:   Problem  List Items Addressed This Visit   HYPERTENSION      BP Readings from Last 3 Encounters:  05/02/13 132/90  04/28/13 147/96  04/28/13 128/64   Bp reasonable control Continue holding lisinoprilHCT until further cardiac eval complete    Relevant Orders      2D Echocardiogram without contrast      Doppler carotid      Ambulatory referral to Cardiology   Odontoid fracture     Persisting neck pain since fall/syncope 04/23/13 event, clarified as odontoid fx on follow up CT with return ER eval 4/11 (due to uncontrolled neck pain) s/p OP nsurg eval 05/01/13 (nudelman) - conservative care with rigid cervical collar x 6-12 weeks - reviewed same recommendations today Pain has completely resolved with stabilization - no need for narcotics    Syncope - Primary     Single syncope event 04/23/13 reviewed No hx same and no recurrence since ER visit and OV with [place] 4/7 reviewed ?orthostatic given soft BP - ACEI-hct on hold since 4/7 and BP improved Advised follow up for echo and carotids as ordered but no reason to pursue MRI brain at this time given normal neuro ecam. rule out cardiogenic cause (?arrythmia) with cards eval - refer for same today    Relevant Orders      2D Echocardiogram without contrast      Doppler carotid      Ambulatory referral to Cardiology     Time spent with pt/family today 25 minutes, greater than 50% time spent counseling patient on syncope, fracture and medication review. Also review of prior records

## 2013-05-02 NOTE — Progress Notes (Signed)
Pre visit review using our clinic review tool, if applicable. No additional management support is needed unless otherwise documented below in the visit note. 

## 2013-05-02 NOTE — Patient Instructions (Signed)
It was good to see you today.  We have reviewed your prior records including labs and tests today  we'll make referral for echo cardiogram and carotid dopplers (hold on MRI for now). Our office will contact you regarding appointment(s) once made. Your results will be released to [place] (or called to you) after review, usually within 72hours after test completion. If any changes need to be made, you will be notified at that same time.  Medications reviewed and updated, no changes recommended at this time. Remain off lisinopril for now  we'll make referral to cardiology . Our office will contact you regarding appointment(s) once made.  Please keep scheduled followup as planned, call sooner if problems.

## 2013-05-03 ENCOUNTER — Telehealth: Payer: Self-pay | Admitting: Oncology

## 2013-05-03 NOTE — Assessment & Plan Note (Signed)
BP Readings from Last 3 Encounters:  05/02/13 132/90  04/28/13 147/96  04/28/13 128/64   Bp reasonable control Continue holding lisinoprilHCT until further cardiac eval complete

## 2013-05-03 NOTE — Assessment & Plan Note (Signed)
Single syncope event 04/23/13 reviewed No hx same and no recurrence since ER visit and OV with [place] 4/7 reviewed ?orthostatic given soft BP - ACEI-hct on hold since 4/7 and BP improved Advised follow up for echo and carotids as ordered but no reason to pursue MRI brain at this time given normal neuro ecam. rule out cardiogenic cause (?arrythmia) with cards eval - refer for same today

## 2013-05-03 NOTE — Telephone Encounter (Signed)
pt cancelled appt for May , he had an accident and udergoing a lot of test, he will call to r/s appt, nurse notified

## 2013-05-03 NOTE — Assessment & Plan Note (Signed)
Persisting neck pain since fall/syncope 04/23/13 event, clarified as odontoid fx on follow up CT with return ER eval 4/11 (due to uncontrolled neck pain) s/p OP nsurg eval 05/01/13 ([facility]) - conservative care with rigid cervical collar x 6-12 weeks - reviewed same recommendations today Pain has completely resolved with stabilization - no need for narcotics

## 2013-05-04 ENCOUNTER — Telehealth: Payer: Self-pay | Admitting: Oncology

## 2013-05-04 NOTE — Telephone Encounter (Signed)
pt called to r/s cx appt....pt ok adn aware of new d.t °

## 2013-05-07 ENCOUNTER — Other Ambulatory Visit (HOSPITAL_COMMUNITY): Payer: Medicare Other

## 2013-05-07 ENCOUNTER — Encounter (HOSPITAL_COMMUNITY): Payer: Medicare Other

## 2013-05-07 ENCOUNTER — Telehealth: Payer: Self-pay | Admitting: *Deleted

## 2013-05-07 NOTE — Telephone Encounter (Signed)
Left msg on vm stating md order two test for husband. Echocardiogram is not until 05/11/13 & New patient appt with cardiologist not until 05/25/13. MD took him off his bp meds. Wanting to know is it ok for him to continue holding this long...David Howard

## 2013-05-07 NOTE — Telephone Encounter (Signed)
Yes - ok to hold BP med until 5/8 pending further advice from cards Thanks!

## 2013-05-07 NOTE — Telephone Encounter (Signed)
Notified pt with md response.../lmb 

## 2013-05-08 ENCOUNTER — Ambulatory Visit: Payer: Medicare Other | Admitting: Cardiology

## 2013-05-11 ENCOUNTER — Ambulatory Visit (HOSPITAL_COMMUNITY): Payer: Medicare Other | Attending: Internal Medicine | Admitting: Radiology

## 2013-05-11 ENCOUNTER — Other Ambulatory Visit (HOSPITAL_COMMUNITY): Payer: Self-pay | Admitting: Radiology

## 2013-05-11 DIAGNOSIS — I10 Essential (primary) hypertension: Secondary | ICD-10-CM | POA: Insufficient documentation

## 2013-05-11 DIAGNOSIS — R55 Syncope and collapse: Secondary | ICD-10-CM

## 2013-05-11 NOTE — Addendum Note (Signed)
Addended by: Ethel Rana on: 05/11/2013 04:09 PM   Modules accepted: Orders

## 2013-05-11 NOTE — Progress Notes (Signed)
Echocardiogram performed.  

## 2013-05-25 ENCOUNTER — Encounter: Payer: Self-pay | Admitting: Cardiology

## 2013-05-25 ENCOUNTER — Encounter (INDEPENDENT_AMBULATORY_CARE_PROVIDER_SITE_OTHER): Payer: Self-pay

## 2013-05-25 ENCOUNTER — Ambulatory Visit: Payer: Medicare Other | Admitting: Oncology

## 2013-05-25 ENCOUNTER — Ambulatory Visit (INDEPENDENT_AMBULATORY_CARE_PROVIDER_SITE_OTHER): Payer: Medicare Other | Admitting: Cardiology

## 2013-05-25 VITALS — BP 141/89 | HR 91 | Ht 69.0 in | Wt 183.0 lb

## 2013-05-25 DIAGNOSIS — R55 Syncope and collapse: Secondary | ICD-10-CM

## 2013-05-25 DIAGNOSIS — I10 Essential (primary) hypertension: Secondary | ICD-10-CM

## 2013-05-25 NOTE — Patient Instructions (Addendum)
Your physician has recommended you make the following change in your medication:   1. Stop Terazosin  Your physician has recommended that you wear an 30 day event monitor. Event monitors are medical devices that record the heart's electrical activity. Doctors most often [place] these monitors to diagnose arrhythmias. Arrhythmias are problems with the speed or rhythm of the heartbeat. The monitor is a small, portable device. You can wear one while you do your normal daily activities. This is usually used to diagnose what is causing palpitations/syncope (passing out).  Your physician recommends that you schedule a follow-up appointment in: 5 weeks with Dr. Marlou Porch

## 2013-05-25 NOTE — Progress Notes (Signed)
[facility]. [address], [address] [place], [place]  [address] Phone: [phone] Fax:  [phone]  Date:  05/25/2013   ID:  Tyaire Odem, DOB 06/01/1923, MRN [id number]  PCP:  Gwendolyn Grant, MD   History of Present Illness: David Howard is a 80 y.o. male here for the evaluation of syncope. He had an isolated episode of syncope which prompted ER evaluation. Event on 04/28/13.  Happened after getting out of hot steamy shower and became weak. He was able to slide down to the floor with no significant prodrome or warning sign. The duration may have been 3-5 minutes. He remembers sliding to the floor then passed out completely. No chest pain, no palpitations. Forehead laceration. Actually suffered a odontoid fracture and is wearing a C-Collar.   No prior syncopal episodes. Echocardiogram reassuring. Potassium 3.8, hemoglobin 13.4  Orthostatics, sitting 145/86-heart rate 75, standing 141/89-heart rate 91.  He has been off of his lisinopril since this event. He still continues to take terazosin.  He owns an accounting firm.   Wt Readings from Last 3 Encounters:  05/25/13 183 lb (83.008 kg)  05/02/13 185 lb 6.4 oz (84.097 kg)  04/28/13 190 lb (86.183 kg)     Past Medical History  Diagnosis Date  . Unspecified essential hypertension   . Peripheral vascular disease, unspecified   . Other abnormal glucose   . Esophageal reflux   . Diverticulosis of colon (without mention of hemorrhage)   . Internal hemorrhoids without mention of complication   . Benign neoplasm of colon   . Inguinal hernia without mention of obstruction or gangrene, unilateral or unspecified, (not specified as recurrent)   . Elevated prostate specific antigen (PSA)   . Malignant neoplasm of bladder, part unspecified   . Spinal stenosis, unspecified region other than cervical   . Personal history of other lymphatic and hematopoietic neoplasm     Past Surgical History  Procedure Laterality Date  .  Tonsillectomy    . Cataract extraction    . Laparoscopy    . Pilonidal cyst / sinus excision      Current Outpatient Prescriptions  Medication Sig Dispense Refill  . aspirin 81 MG tablet Take 81 mg by mouth daily.        . Calcium Carbonate-Vitamin D (CALCIUM-VITAMIN D) 500-200 MG-UNIT per tablet Take 1 tablet by mouth 2 (two) times daily with a meal.       . Cholecalciferol (VITAMIN D3) 1000 UNITS CAPS Take 1 capsule by mouth daily.      . cyclobenzaprine (FLEXERIL) 10 MG tablet Take 10 mg by mouth 3 (three) times daily as needed for muscle spasms.      . cyproheptadine (PERIACTIN) 4 MG tablet Take as directed- as needed      . finasteride (PROSCAR) 5 MG tablet Take 5 mg by mouth daily.        . Multiple Vitamin (MULTIVITAMIN) capsule Take 1 capsule by mouth daily.        Marland Kitchen triamcinolone (KENALOG) 0.5 % cream Apply 1 application topically 2 (two) times daily as needed. Mixed with Cetaphil       No current facility-administered medications for this visit.    Allergies:   No Known Allergies  Social History:  The patient  reports that he has quit smoking. His smoking use included Cigarettes. He smoked 0.00 packs per day. He has never used smokeless tobacco. He reports that he drinks alcohol. 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  Family History  Problem Relation Age of Onset  . Kidney disease Father     ROS:  Please see the history of present illness.   Denies any strokelike symptoms, bleeding, melena, fevers, chills, headaches, chest pain, shortness of breath.   All other systems reviewed and negative.   PHYSICAL EXAM: VS:  BP 141/89  Pulse 91  Ht 5\' 9"  (1.753 m)  Wt 183 lb (83.008 kg)  BMI 27.01 kg/m2 Well nourished, well developed, in no acute distress HEENT: normal, [place]/AT, EOMI, C-collar in place Neck: no JVD, normal carotid upstroke, no bruit Cardiac:  normal S1, S2; RRR; no murmur Lungs:  clear to auscultation bilaterally, no wheezing, rhonchi or rales Abd:  soft, nontender, no hepatomegaly, no bruits Ext: no edema, 2+ distal pulses Skin: warm and dry GU: deferred Neuro: no focal abnormalities noted, AAO x 3  EKG:  Sinus tachy 102 - Left axis deviation. PRWP. Normal intervals. Echocardiogram 05/11/13-normal ejection fraction  ASSESSMENT AND PLAN:  1. Syncope-isolated episode. Could have been exacerbated by dehydration/warmth in bathroom/shower, orthostatic hypotension, dysautonomia. Cannot exclude arrhythmia such as pulse or extreme tachycardia. He does not member having any palpitations. I would like to place a 30 day event monitor to evaluate for arrhythmias. Echocardiogram was reassuring. Liberalize fluid intake.  I also suggested discontinuation of terazosin as this may cause orthostatic hypotension. Will talk with Dr. Harvie Bridge his urologist/ Dr. Asa Lente about alternatives. I'm willing to tolerate a mild increase in blood pressure to alleviate possible orthostasis. Will send them both notes. Agree with stopping lisinopril. Continue to monitor.  2. 5 week follow up.  Signed, Candee Furbish, MD [facility]  05/25/2013 1:48 PM

## 2013-05-28 ENCOUNTER — Encounter (INDEPENDENT_AMBULATORY_CARE_PROVIDER_SITE_OTHER): Payer: Medicare Other

## 2013-05-28 ENCOUNTER — Encounter: Payer: Self-pay | Admitting: *Deleted

## 2013-05-28 ENCOUNTER — Telehealth: Payer: Self-pay | Admitting: *Deleted

## 2013-05-28 DIAGNOSIS — R55 Syncope and collapse: Secondary | ICD-10-CM

## 2013-05-28 NOTE — Telephone Encounter (Signed)
New message     Son want to talk to Dr Marlou Porch regarding appt on last Friday, event monitor and his medications

## 2013-05-28 NOTE — Progress Notes (Signed)
Patient ID: David Howard, male   DOB: 12/03/1923, 80 y.o.   MRN: [id number] [employer] 30 day cardiac event monitor applied to patient.

## 2013-05-28 NOTE — Telephone Encounter (Signed)
Patient was in the office to have an event monitor placed and asked to have his BP checked without any symptoms. David Howard the monitor tech checked his BP and she reported that it was 170/102. He is currently taking Proscar 5mg  every day. NOT taking Hytrin 5mg  and NOT taking Lisinopril 20/HCTZ 12.5mg . These 2 meds were stopped due to a syncopal episode. Advised will send message to Dr.Skains and call him back.

## 2013-05-29 NOTE — Telephone Encounter (Signed)
Follow up    Pt is complaining his blood pressure is really high--170/102.  Pt is not on blood pressure any more--PCP and Dr Marlou Porch took him off the medication.

## 2013-05-29 NOTE — Telephone Encounter (Signed)
David Howard, is son [name]? Phone [phone]  Thanks

## 2013-05-29 NOTE — Telephone Encounter (Signed)
Pt called because he said pt was in the office for  an Event  monitor placement. Pt's BP then was 170/ 102. Pt states last night he woke up  With a headache. Pt states his thinks his BP is  high because of the headache . I asked if he can take his BP while I was in the phone with him . Wife was not able  states the machine always beeped  an error. According to pt he was taking  These medication: the Lisinopril/HCTZ 20-12.5 mg which was D/C due to syncope a month ago. The Hytrin 5 mg was D/C since last Friday. Pt and wife states that pt had only one syncope episode, none since medication was D/C.   Dr. Meda Coffee DOD recommended for pt to take Metoprolol 25 mg twice a day.  When I called pt to let him and his wife  know of MD's recommendations. Pt 's wife states " I was able to take his BP after we talk; I found out what I was doing wrong, I and took his BP 5 times" " 133/90, 139/85, 128/81, 151/95, 142/96 and 141/89". Pt and wife would like for Dr Marlou Porch to okay if pt can take the Metoprolol before we call send the order to [facility]. Pt is aware that we will call them back when MD let us know.

## 2013-05-29 NOTE — Telephone Encounter (Signed)
Follow up     Yes, this is his son.  He could not make it to the ov appt.

## 2013-05-30 MED ORDER — LISINOPRIL 5 MG PO TABS: ORAL_TABLET | ORAL | Status: DC

## 2013-05-30 NOTE — Telephone Encounter (Signed)
On repeat, the average of the blood pressures were reasonable. Looking for on the whole less than 150/90. Remember, we are willing to tolerate a slightly higher blood pressure given the episode when his blood pressure likely dropped causing him to pass out. Let's not start metoprolol at this time.   I do agree that 170/102 is too high. Instead of starting a new medication (metoprolol), let's add back a very low dose of lisinopril only, 5mg  with no HCTZ (diuretic). Let's try this over the next week and monitor BP daily.   Let me know how this works.   Candee Furbish, MD

## 2013-05-30 NOTE — Telephone Encounter (Signed)
Patient and his wife are aware that he needs to start Lisinopril 5mg  1 tab every day per Dr.Skains ( NO Metoprolol). New script sent to [facility].

## 2013-06-04 ENCOUNTER — Telehealth: Payer: Self-pay | Admitting: Internal Medicine

## 2013-06-04 NOTE — Telephone Encounter (Signed)
Called pt had to leave a msg on his vm. Left msg stating Dr. Asa Lente is out of the office until this Thursday 06/07/13. For pt to follow instructions from his cardiologist on his blood pressure, (see phone note 5/11) If he is still having increase BP he need to contact Dr. Marlou Porch office for recommendations...Johny Chess

## 2013-06-04 NOTE — Telephone Encounter (Signed)
Pt wants to know if Dr. Asa Lente has had communication on his blood pressure meds from cardiology. Or with his urologist.  His bp has started to go up.

## 2013-06-06 ENCOUNTER — Telehealth: Payer: Self-pay | Admitting: *Deleted

## 2013-06-06 NOTE — Telephone Encounter (Signed)
Dr. Cheryln Manly left msg on vm stating his uncle has been trying to get in contact with office but having no success. Left pt # to give him a call. Called Mr. Wandel he stated that he is still having increase BP reading. Having urinary frequency mainly at night. Dr. Marlou Porch started him back on Lisinopril, but its a small dose that he had been taking. He thinks med need to be increase. Saw neurosurgeon on yesterday and his BP was 181/89. He is wanting recommendations on what to do about BP. Inform pt since Dr. Asa Lente is out of the office, and since Dr. Marlou Porch is seeing him for his blood pressure, and already made changes to his med he need to contact their office. He states he tried to call cardiology several times but can't get through. Inform pt i will send a phone msg to Dr. Gillian Shields & his nurse to give him a call back...David Howard

## 2013-06-07 ENCOUNTER — Telehealth: Payer: Self-pay | Admitting: Internal Medicine

## 2013-06-07 MED ORDER — DOXAZOSIN MESYLATE 2 MG PO TABS: 2.0000 mg | ORAL_TABLET | Freq: Every day | ORAL | Status: DC

## 2013-06-07 MED ORDER — LISINOPRIL 10 MG PO TABS: 10.0000 mg | ORAL_TABLET | Freq: Every day | ORAL | Status: DC

## 2013-06-07 NOTE — Telephone Encounter (Signed)
Notified pt with md response. Pt ask when does he need to take med. Ask Dr. Asa Lente & she stated at bedtime...David Howard

## 2013-06-07 NOTE — Telephone Encounter (Signed)
Will begin low dose generic "cardura" for urinary symptoms - erx [facility] done Okay to take this medication in addition to other medications please call if SBP less than 110 or if any symptoms of lightheadedness, dizziness

## 2013-06-07 NOTE — Telephone Encounter (Signed)
Patient is calling requesting to speak with Dr. Asa Lente in regards to his medications. Patient says that he has already spoken with his Cardiology office today and was told that his lisinopril was increased. He is now asking about a medication for his urinary issues.  Please advise.

## 2013-06-07 NOTE — Telephone Encounter (Signed)
Increase lisinopril to 10mg  PO once a day from 5.

## 2013-06-15 ENCOUNTER — Telehealth: Payer: Self-pay | Admitting: Cardiology

## 2013-06-15 NOTE — Telephone Encounter (Signed)
New problem    Pt asked that he gets a call back to day please in regards to his monitor please

## 2013-06-15 NOTE — Telephone Encounter (Signed)
I informed pt of his monitors end of service date and when his appt with Dr. Marlou Porch is.

## 2013-07-05 ENCOUNTER — Ambulatory Visit (INDEPENDENT_AMBULATORY_CARE_PROVIDER_SITE_OTHER): Payer: Medicare Other | Admitting: Cardiology

## 2013-07-05 ENCOUNTER — Telehealth: Payer: Self-pay | Admitting: Cardiology

## 2013-07-05 ENCOUNTER — Encounter: Payer: Self-pay | Admitting: Cardiology

## 2013-07-05 VITALS — BP 158/98 | HR 100 | Ht 60.0 in | Wt 171.1 lb

## 2013-07-05 DIAGNOSIS — S12110A Anterior displaced Type II dens fracture, initial encounter for closed fracture: Secondary | ICD-10-CM

## 2013-07-05 DIAGNOSIS — S12100A Unspecified displaced fracture of second cervical vertebra, initial encounter for closed fracture: Secondary | ICD-10-CM

## 2013-07-05 DIAGNOSIS — R55 Syncope and collapse: Secondary | ICD-10-CM

## 2013-07-05 DIAGNOSIS — I10 Essential (primary) hypertension: Secondary | ICD-10-CM

## 2013-07-05 MED ORDER — LISINOPRIL 20 MG PO TABS: 20.0000 mg | ORAL_TABLET | Freq: Every day | ORAL | Status: DC

## 2013-07-05 NOTE — Patient Instructions (Signed)
Your physician has recommended you make the following change in your medication:  1. Increase Lisinopril 20 mg daily  Your physician recommends that you schedule a follow-up appointment as needed

## 2013-07-05 NOTE — Telephone Encounter (Signed)
New Message  Pt wife called, request a call back to discuss if he is able to drive// please call

## 2013-07-05 NOTE — Telephone Encounter (Signed)
Reviewed with Dr. Marlou Porch and pt may drive. I spoke with pt's wife and gave her this information.

## 2013-07-05 NOTE — Progress Notes (Signed)
[place]. [address], [address] [place], [place]  [address] Phone: [phone] Fax:  [phone]  Date:  07/05/2013   ID:  David Howard, DOB 1923/12/20, MRN [id number]  PCP:  Gwendolyn Grant, MD   History of Present Illness: David Howard is a 80 y.o. male here for follow up of syncope evaluation. He had an isolated episode of syncope which prompted ER evaluation. Event on 04/28/13.  Happened after getting out of hot steamy shower and became weak. He was able to slide down to the floor with no significant prodrome or warning sign. The duration may have been 3-5 minutes. He remembers sliding to the floor then passed out completely. No chest pain, no palpitations. Forehead laceration. Actually suffered a odontoid fracture and is wearing a C-Collar.   No prior syncopal episodes. Echocardiogram reassuring. Potassium 3.8, hemoglobin 13.4  Orthostatics, sitting 145/86-heart rate 75, standing 141/89-heart rate 91.  Use originally taken off of lisinopril/hct 12.5 and subsequently terazosin because of possible hypotensive side effect. Lisinopril only was slowly added back after blood pressures became quite elevated at home. He also restarted low-dose generic Cardura for urinary symptoms.  Event monitor, 30 days with start date on 05/28/13 showed no adverse arrhythmias, sinus rhythm with rare PACs, rare first degree AV block. Reassuring. No pauses, no tachycardia or bradycardia arrhythmias. Excellent.  He owns accounting firm.   Wt Readings from Last 3 Encounters:  07/05/13 171 lb 1.9 oz (77.62 kg)  05/25/13 183 lb (83.008 kg)  05/02/13 185 lb 6.4 oz (84.097 kg)     Past Medical History  Diagnosis Date  . Unspecified essential hypertension   . Peripheral vascular disease, unspecified   . Other abnormal glucose   . Esophageal reflux   . Diverticulosis of colon (without mention of hemorrhage)   . Internal hemorrhoids without mention of complication   . Benign neoplasm of colon     . Inguinal hernia without mention of obstruction or gangrene, unilateral or unspecified, (not specified as recurrent)   . Elevated prostate specific antigen (PSA)   . Malignant neoplasm of bladder, part unspecified   . Spinal stenosis, unspecified region other than cervical   . Personal history of other lymphatic and hematopoietic neoplasm     Past Surgical History  Procedure Laterality Date  . Tonsillectomy    . Cataract extraction    . Laparoscopy    . Pilonidal cyst / sinus excision      Current Outpatient Prescriptions  Medication Sig Dispense Refill  . aspirin 81 MG tablet Take 81 mg by mouth daily.        . Calcium Carbonate-Vitamin D (CALCIUM-VITAMIN D) 500-200 MG-UNIT per tablet Take 1 tablet by mouth 2 (two) times daily with a meal.       . Cholecalciferol (VITAMIN D3) 1000 UNITS CAPS Take 1 capsule by mouth daily.      . cyclobenzaprine (FLEXERIL) 10 MG tablet Take 10 mg by mouth 3 (three) times daily as needed for muscle spasms.      . cyproheptadine (PERIACTIN) 4 MG tablet Take as directed- as needed      . doxazosin (CARDURA) 2 MG tablet Take 1 tablet (2 mg total) by mouth at bedtime.  30 tablet  5  . finasteride (PROSCAR) 5 MG tablet Take 5 mg by mouth daily.        Marland Kitchen lisinopril (PRINIVIL,ZESTRIL) 10 MG tablet Take 1 tablet (10 mg total) by mouth daily.  30 tablet  3  .  Multiple Vitamin (MULTIVITAMIN) capsule Take 1 capsule by mouth daily.        Marland Kitchen triamcinolone (KENALOG) 0.5 % cream Apply 1 application topically 2 (two) times daily as needed. Mixed with Cetaphil       No current facility-administered medications for this visit.    Allergies:   No Known Allergies  Social History:  The patient  reports that he has quit smoking. His smoking use included Cigarettes. He smoked 0.00 packs per day. He has never used smokeless tobacco. He reports that he drinks alcohol. 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  Family History  Problem Relation Age of Onset  .  Kidney disease Father     ROS:  Please see the history of present illness.   Denies any strokelike symptoms, bleeding, melena, fevers, chills, headaches, chest pain, shortness of breath.   All other systems reviewed and negative.   PHYSICAL EXAM: VS:  BP 161/101  Pulse 101  Ht 5' (1.524 m)  Wt 171 lb 1.9 oz (77.62 kg)  BMI 33.42 kg/m2 Well nourished, well developed, in no acute distress HEENT: normal, [place]/AT, EOMI, C-collar in place Neck: no JVD, normal carotid upstroke, no bruit Cardiac:  normal S1, S2; RRR; no murmur Lungs:  clear to auscultation bilaterally, no wheezing, rhonchi or rales Abd: soft, nontender, no hepatomegaly, no bruits Ext: no edema, 2+ distal pulses Skin: warm and dry GU: deferred Neuro: no focal abnormalities noted, AAO x 3  EKG:  Sinus tachy 102 - Left axis deviation. PRWP. Normal intervals. Echocardiogram 05/11/13-normal ejection fraction 60%, moderate aortic valve sclerosis with mild regurgitation, aortic root 38 mm. Event monitor: 05/28/13-sinus rhythm, rare PACs, rare first degree AV block, no adverse arrhythmias, no pauses.  ASSESSMENT AND PLAN:  1. Syncope-isolated episode. However this did result in neck fracture. Reassuring cardiac workup including echocardiogram and event monitor which showed no adverse arrhythmias. Could have been exacerbated by dehydration/warmth in bathroom/shower, orthostatic hypotension, dysautonomia. No further workup necessary at this point. Obviously if any prodrome occurs, vagal-like, he knows to lay down. If syncope returns, he will let me know. 2. Hypertension-I will increase his lisinopril to 20 mg a day. I will not add back hydrochlorothiazide 12.5 at this point. This would be the next step that would be reasonable. He is now having some readings at home in the 262M systolic. Improved. Further blood pressure control per his internist Dr. Asa Lente.  3. We'll see him back on as-needed basis.  Signed, Candee Furbish, MD [facility]    07/05/2013 10:27 AM

## 2013-07-13 ENCOUNTER — Ambulatory Visit (HOSPITAL_BASED_OUTPATIENT_CLINIC_OR_DEPARTMENT_OTHER): Payer: Medicare Other | Admitting: Oncology

## 2013-07-13 VITALS — BP 164/96 | HR 102 | Temp 97.8°F | Resp 18 | Ht 60.0 in | Wt 174.6 lb

## 2013-07-13 DIAGNOSIS — Z87898 Personal history of other specified conditions: Secondary | ICD-10-CM

## 2013-07-13 DIAGNOSIS — R630 Anorexia: Secondary | ICD-10-CM

## 2013-07-13 NOTE — Progress Notes (Signed)
  [facility] OFFICE PROGRESS NOTE   Diagnosis: History of cutaneous non-Hodgkin's lymphoma  INTERVAL HISTORY:   David Howard returns as scheduled. No fever, night sweats, or palpable skin nodules. He developed anorexia after he fell and injured the cervical spine in April of this year. He has an odontoid fracture and is followed by Dr. Sherwood Gambler. He wears a cervical brace. .  Objective:  Vital signs in last 24 hours:  Blood pressure 164/96, pulse 102, temperature 97.8 F (36.6 C), temperature source Oral, resp. rate 18, height 5' (1.524 m), weight 174 lb 9.6 oz (79.198 kg), SpO2 99.00%.    HEENT: Neck without mass Lymphatics: No cervical, supra-clavicular, axillary, or inguinal nodes Resp: Lungs with and inspiratory fine rales at the lower chest bilaterally, no respiratory distress Cardio: Regular rate and rhythm GI: No hepatosplenomegaly Vascular: No leg edema  Skin: Benign appearing moles over the trunk. No suspicious skin lesions noted. Areas of hyperpigmentation at the mid low back and right lateral chest without nodularity.    Medications: I have reviewed the patient's current medications.  Assessment/Plan: 1. Primary cutaneous B-cell lymphoma, status post primary radiation in 2003. No clinical evidence for recurrent disease. 2. Chronic prominence at the right medial scapula, likely benign asymmetry of the back musculature. 3. History of benign prostatic hypertrophy and superficial bladder cancer, followed by Dr. Alinda Money. 4. History of a right inguinal hernia, status post repair by Dr. Zella Richer.   Disposition:  He remains in clinical remission from non-Hodgkin's lymphoma. I doubt the anorexia is related to the lymphoma history. He will continue followup with Dr. Sherwood Gambler for management of the odontoid fracture. He plans to see Dr. Asa Lente to establish a primary patient.  David Howard continues followup with dermatology. He would also like to continue  followup at the [facility]. He will return for an office visit in one year.  Betsy Coder, MD  07/13/2013  9:13 AM

## 2013-07-15 ENCOUNTER — Telehealth: Payer: Self-pay | Admitting: Internal Medicine

## 2013-07-15 NOTE — Telephone Encounter (Signed)
Called pt and left message mailed appt to pt

## 2013-07-16 ENCOUNTER — Telehealth: Payer: Self-pay | Admitting: Oncology

## 2013-07-16 NOTE — Telephone Encounter (Signed)
s.w. pt and advsised on June 2016 appt....pt ok and aware

## 2013-07-17 ENCOUNTER — Other Ambulatory Visit (HOSPITAL_COMMUNITY): Payer: Self-pay | Admitting: Neurosurgery

## 2013-07-17 DIAGNOSIS — S12110S Anterior displaced Type II dens fracture, sequela: Secondary | ICD-10-CM

## 2013-07-27 ENCOUNTER — Ambulatory Visit: Payer: Medicare Other | Admitting: Internal Medicine

## 2013-07-30 ENCOUNTER — Ambulatory Visit (HOSPITAL_COMMUNITY)
Admission: RE | Admit: 2013-07-30 | Discharge: 2013-07-30 | Disposition: A | Discharge status: Home / Self Care | Payer: Medicare Other | Source: Ambulatory Visit | Attending: Neurosurgery | Admitting: Neurosurgery

## 2013-07-30 DIAGNOSIS — I6529 Occlusion and stenosis of unspecified carotid artery: Secondary | ICD-10-CM | POA: Insufficient documentation

## 2013-07-30 DIAGNOSIS — M129 Arthropathy, unspecified: Secondary | ICD-10-CM | POA: Insufficient documentation

## 2013-07-30 DIAGNOSIS — S12110S Anterior displaced Type II dens fracture, sequela: Secondary | ICD-10-CM

## 2013-07-30 DIAGNOSIS — M47812 Spondylosis without myelopathy or radiculopathy, cervical region: Secondary | ICD-10-CM | POA: Insufficient documentation

## 2013-07-30 DIAGNOSIS — M5030 Other cervical disc degeneration, unspecified cervical region: Secondary | ICD-10-CM | POA: Insufficient documentation

## 2013-07-30 DIAGNOSIS — IMO0002 Reserved for concepts with insufficient information to code with codable children: Secondary | ICD-10-CM | POA: Insufficient documentation

## 2013-08-07 ENCOUNTER — Telehealth: Payer: Self-pay

## 2013-08-07 NOTE — Telephone Encounter (Signed)
Patient wife called requesting to know if he should be fasting for appointment Thursday, I advised yes due to not sure what all labs will be collected. Wife also states that he was seen my Dr. Durene Cal office today and per Amy they will fax over his office notes/MRI for upcoming appointment. She wanted to provide phone number for the records to ensure that PCP will have them for appointment. Please call [phone] ext [phone] if record not received today. Thanks

## 2013-08-09 ENCOUNTER — Ambulatory Visit (INDEPENDENT_AMBULATORY_CARE_PROVIDER_SITE_OTHER): Payer: Medicare Other | Admitting: Internal Medicine

## 2013-08-09 ENCOUNTER — Encounter: Payer: Self-pay | Admitting: Internal Medicine

## 2013-08-09 ENCOUNTER — Other Ambulatory Visit (INDEPENDENT_AMBULATORY_CARE_PROVIDER_SITE_OTHER): Payer: Medicare Other

## 2013-08-09 VITALS — BP 138/80 | HR 82 | Temp 98.0°F | Ht 60.0 in | Wt 167.5 lb

## 2013-08-09 DIAGNOSIS — R7309 Other abnormal glucose: Secondary | ICD-10-CM

## 2013-08-09 DIAGNOSIS — I10 Essential (primary) hypertension: Secondary | ICD-10-CM

## 2013-08-09 DIAGNOSIS — M4800 Spinal stenosis, site unspecified: Secondary | ICD-10-CM

## 2013-08-09 DIAGNOSIS — R55 Syncope and collapse: Secondary | ICD-10-CM

## 2013-08-09 LAB — CBC WITH DIFFERENTIAL/PLATELET
BASOS ABS: 0 10*3/uL (ref 0.0–0.1)
Basophils Relative: 0.2 % (ref 0.0–3.0)
EOS ABS: 0.2 10*3/uL (ref 0.0–0.7)
Eosinophils Relative: 2 % (ref 0.0–5.0)
HEMATOCRIT: 39.6 % (ref 39.0–52.0)
Hemoglobin: 13.6 g/dL (ref 13.0–17.0)
LYMPHS ABS: 1.7 10*3/uL (ref 0.7–4.0)
Lymphocytes Relative: 17.5 % (ref 12.0–46.0)
MCHC: 34.3 g/dL (ref 30.0–36.0)
MCV: 87.9 fl (ref 78.0–100.0)
Monocytes Absolute: 0.7 10*3/uL (ref 0.1–1.0)
Monocytes Relative: 7 % (ref 3.0–12.0)
Neutro Abs: 7.3 10*3/uL (ref 1.4–7.7)
Neutrophils Relative %: 73.3 % (ref 43.0–77.0)
PLATELETS: 182 10*3/uL (ref 150.0–400.0)
RBC: 4.5 Mil/uL (ref 4.22–5.81)
RDW: 13.1 % (ref 11.5–15.5)
WBC: 9.9 10*3/uL (ref 4.0–10.5)

## 2013-08-09 LAB — TSH: TSH: 1.3 u[IU]/mL (ref 0.35–4.50)

## 2013-08-09 LAB — BASIC METABOLIC PANEL
BUN: 28 mg/dL — AB (ref 6–23)
CALCIUM: 9.4 mg/dL (ref 8.4–10.5)
CO2: 29 meq/L (ref 19–32)
Chloride: 103 mEq/L (ref 96–112)
Creatinine, Ser: 1.1 mg/dL (ref 0.4–1.5)
GFR: 64.43 mL/min (ref >60.00)
GLUCOSE: 110 mg/dL — AB (ref 70–99)
Potassium: 3.9 mEq/L (ref 3.5–5.1)
SODIUM: 137 meq/L (ref 135–145)

## 2013-08-09 LAB — HEPATIC FUNCTION PANEL
ALBUMIN: 3.8 g/dL (ref 3.5–5.2)
ALT: 19 U/L (ref 0–53)
AST: 25 U/L (ref 0–37)
Alkaline Phosphatase: 41 U/L (ref 39–117)
Bilirubin, Direct: 0.2 mg/dL (ref 0.0–0.3)
Total Bilirubin: 1.3 mg/dL — ABNORMAL HIGH (ref 0.2–1.2)
Total Protein: 6.8 g/dL (ref 6.0–8.3)

## 2013-08-09 LAB — LIPID PANEL
CHOLESTEROL: 153 mg/dL (ref 0–200)
HDL: 65.9 mg/dL (ref >39.00)
LDL Cholesterol: 75 mg/dL (ref 0–99)
NonHDL: 87.1
TRIGLYCERIDES: 61 mg/dL (ref 0.0–149.0)
Total CHOL/HDL Ratio: 2
VLDL: 12.2 mg/dL (ref 0.0–40.0)

## 2013-08-09 LAB — HEMOGLOBIN A1C: Hgb A1c MFr Bld: 5.2 % (ref 4.6–6.5)

## 2013-08-09 MED ORDER — DOCUSATE SODIUM 100 MG PO CAPS: 100.0000 mg | ORAL_CAPSULE | Freq: Two times a day (BID) | ORAL | Status: DC | PRN

## 2013-08-09 NOTE — Progress Notes (Signed)
Pre visit review using our clinic review tool, if applicable. No additional management support is needed unless otherwise documented below in the visit note. 

## 2013-08-09 NOTE — Patient Instructions (Signed)
It was good to see you today.  We have reviewed your prior records including labs and tests today  Test(s) ordered today. Your results will be released to [facility] (or called to you) after review, usually within 72hours after test completion. If any changes need to be made, you will be notified at that same time.  Medications reviewed and updated, no changes recommended at this time. Refill on medication(s) as discussed today.  Continue working with your other specialists as ongoing and reviewed today  Please schedule followup in 6 months, call sooner if problems.

## 2013-08-09 NOTE — Progress Notes (Signed)
Subjective:    Patient ID: David Howard, male    DOB: 1923/11/03, 80 y.o.   MRN: [id number]  HPI  Patient here for follow up Reviewed chronic medical issues and interval medical events  Past Medical History  Diagnosis Date  . Unspecified essential hypertension   . Peripheral vascular disease, unspecified   . Other abnormal glucose   . Esophageal reflux   . Diverticulosis of colon (without mention of hemorrhage)   . Internal hemorrhoids without mention of complication   . Benign neoplasm of colon   . Inguinal hernia without mention of obstruction or gangrene, unilateral or unspecified, (not specified as recurrent)   . Elevated prostate specific antigen (PSA)     BPH, follows with uro  . Malignant neoplasm of bladder, part unspecified   . Spinal stenosis, unspecified region other than cervical     R>L LE pain  . Personal history of other lymphatic and hematopoietic neoplasm 2003    Primary cutaneous B-cell lymphoma, s/p xrt    Review of Systems  Constitutional: Negative for fever and fatigue.  Respiratory: Negative for cough and shortness of breath.   Cardiovascular: Negative for chest pain and leg swelling.  Neurological: Negative for dizziness and light-headedness.       Objective:   Physical Exam  BP 138/80  Pulse 82  Temp(Src) 98 F (36.7 C) (Oral)  Ht 5' (1.524 m)  Wt 167 lb 8 oz (75.978 kg)  BMI 32.71 kg/m2  SpO2 97% Wt Readings from Last 3 Encounters:  08/09/13 167 lb 8 oz (75.978 kg)  07/13/13 174 lb 9.6 oz (79.198 kg)  07/05/13 171 lb 1.9 oz (77.62 kg)   Constitutional: David Howard is wearing rigid collar; appears well-developed and well-nourished. No distress. wife Zelda Cardiovascular: Normal rate, regular rhythm and normal heart sounds.  No murmur heard. No BLE edema. Pulmonary/Chest: Effort normal and breath sounds normal. No respiratory distress. David Howard has no wheezes.  Neurological: David Howard is alert and oriented to person, place, and time. No cranial nerve  deficit. Coordination, balance, strength, speech and gait are normal.  Skin: Skin is warm and dry. No rash noted. No erythema.  Psychiatric: David Howard has a normal mood and affect. behavior is normal. Judgment and thought content normal.   Lab Results  Component Value Date   WBC 10.9* 04/24/2013   HGB 13.4 04/24/2013   HCT 39.6 04/24/2013   PLT 194.0 04/24/2013   GLUCOSE 118* 04/24/2013   CHOL 176 07/11/2012   TRIG 97.0 07/11/2012   HDL 68.20 07/11/2012   LDLCALC 88 07/11/2012   ALT 20 04/24/2013   AST 40* 04/24/2013   NA 133* 04/24/2013   K 3.8 04/24/2013   CL 97 04/24/2013   CREATININE 1.1 04/24/2013   BUN 25* 04/24/2013   CO2 28 04/24/2013   TSH 1.09 04/24/2013   PSA 3.21 07/02/2008    Ct Cervical Spine Wo Contrast  07/30/2013   CLINICAL DATA:  Followup cervical spine fracture.  EXAM: CT CERVICAL SPINE WITHOUT CONTRAST  TECHNIQUE: Multidetector CT imaging of the cervical spine was performed without intravenous contrast. Multiplanar CT image reconstructions were also generated.  COMPARISON:  CT cervical spine 04/28/2013.  FINDINGS: Type 2 odontoid fracture is re-demonstrated, along with minimal posterior displacement of 2 mm. There is no bony bridging across the fracture. Mild pannus surrounds the odontoid.  No other fractures are seen. There is severe cervical spondylosis with scoliotic deformity convex RIGHT. Osseous bridging can be seen across the C5-6 interspace where there is mild  facet mediated slip. Severe disc space narrowing is present at C4-5 with moderate disc space narrowing at C6-7 and C7-T1. Advanced cervical facet arthropathy is present at multiple levels.  No neck masses. Carotid atherosclerosis. No lung apex lesion. Trachea midline.  IMPRESSION: Type 2 odontoid fracture without visible healing. The appearance is unchanged from 04/28/2013.   Electronically Signed   By: Rolla Flatten M.D.   On: 07/30/2013 13:41       Assessment & Plan:   Problem List Items Addressed This Visit   DIABETES MELLITUS,  BORDERLINE     Hx same, never on meds Given weight loss, check labs now    Relevant Orders      Hemoglobin A1c (Completed)   HYPERTENSION - Primary      BP Readings from Last 3 Encounters:  08/09/13 138/80  07/13/13 164/96  07/05/13 158/98   BP reasonable control Continue holding HCTZ due to syncope hx 04/2013 (?vasovagal/orthostatic)  The current medical regimen is effective;  continue present plan and medications.     Relevant Orders      Basic metabolic panel (Completed)      Lipid panel (Completed)      TSH (Completed)   SPINAL STENOSIS     Daily pain symptoms with R>L LE affected Ongoing care with NSurg (nudleman) reviewed Encouraged to proceed with 2nd ESI despite no benefit from 1st    Syncope     Single syncope event 04/23/13 reviewed - resulted in odontoid fx No hx same and no recurrence since Felt to be orthostatic given soft BP - hctz on hold since 4/7   reviewed unremarkable echo, carotids, holter and cardiac review 06/2013 no reason to pursue MRI brain at this time     Relevant Orders      CBC with Differential (Completed)      Hepatic function panel (Completed)      TSH (Completed)

## 2013-08-10 NOTE — Assessment & Plan Note (Signed)
Hx same, never on meds Given weight loss, check labs now

## 2013-08-10 NOTE — Assessment & Plan Note (Signed)
Single syncope event 04/23/13 reviewed - resulted in odontoid fx No hx same and no recurrence since Felt to be orthostatic given soft BP - hctz on hold since 4/7   reviewed unremarkable echo, carotids, holter and cardiac review 06/2013 no reason to pursue MRI brain at this time

## 2013-08-10 NOTE — Assessment & Plan Note (Signed)
BP Readings from Last 3 Encounters:  08/09/13 138/80  07/13/13 164/96  07/05/13 158/98   BP reasonable control Continue holding HCTZ due to syncope hx 04/2013 (?vasovagal/orthostatic)  The current medical regimen is effective;  continue present plan and medications.

## 2013-08-10 NOTE — Assessment & Plan Note (Signed)
Daily pain symptoms with R>L LE affected Ongoing care with NSurg (nudleman) reviewed Encouraged to proceed with 2nd ESI despite no benefit from 1st

## 2013-10-02 ENCOUNTER — Telehealth: Payer: Self-pay | Admitting: Internal Medicine

## 2013-10-02 DIAGNOSIS — R630 Anorexia: Secondary | ICD-10-CM

## 2013-10-02 DIAGNOSIS — Z8579 Personal history of other malignant neoplasms of lymphoid, hematopoietic and related tissues: Secondary | ICD-10-CM

## 2013-10-02 DIAGNOSIS — R634 Abnormal weight loss: Secondary | ICD-10-CM

## 2013-10-02 NOTE — Telephone Encounter (Signed)
Patients wife would like a call in regards to patient.  He fell 5 months ago and was seen but since then he has lost 20 pounds.  She states husband has not appetite.  She would like to know what she needs to do whether it be something over the phone or an OV.

## 2013-10-02 NOTE — Telephone Encounter (Signed)
Please let her know I will order a CT scan to look for any problem causing weight loss/poor appetite If CT scan unremarkable, we will consider starting him on low dose medication to help improve mood and appetite Ok to schedule OV to review results and discuss potential meds if preferable thanks

## 2013-10-03 NOTE — Telephone Encounter (Signed)
Pts wife informed of order.

## 2013-10-04 ENCOUNTER — Ambulatory Visit (INDEPENDENT_AMBULATORY_CARE_PROVIDER_SITE_OTHER): Payer: Medicare Other

## 2013-10-04 ENCOUNTER — Ambulatory Visit: Payer: Medicare Other

## 2013-10-04 DIAGNOSIS — Z23 Encounter for immunization: Secondary | ICD-10-CM

## 2013-10-05 ENCOUNTER — Other Ambulatory Visit: Payer: Self-pay | Admitting: Internal Medicine

## 2013-10-05 DIAGNOSIS — R630 Anorexia: Secondary | ICD-10-CM

## 2013-10-08 ENCOUNTER — Telehealth: Payer: Self-pay | Admitting: Internal Medicine

## 2013-10-08 ENCOUNTER — Other Ambulatory Visit (INDEPENDENT_AMBULATORY_CARE_PROVIDER_SITE_OTHER): Payer: Medicare Other

## 2013-10-08 DIAGNOSIS — R630 Anorexia: Secondary | ICD-10-CM

## 2013-10-08 LAB — BUN: BUN: 18 mg/dL (ref 6–23)

## 2013-10-08 LAB — CREATININE, SERUM: CREATININE: 1.1 mg/dL (ref 0.4–1.5)

## 2013-10-08 NOTE — Telephone Encounter (Signed)
Vanda from [facility] called in said that pt need labs 6 weeks before ct scan.  Wanted to know if we wanted to fax order there to do labs or do them here in the office..   Thank You

## 2013-10-08 NOTE — Telephone Encounter (Signed)
Spoke with pt wife. Informed that the lab results are in (did give the results to pt or his wife) and that the CT appointment has not been canceled or changed.  Zelda (pt wife) had questions about the side effects of using contrast material and the medical necessity of using contrast.   Pt and his wife expressed understanding of both.

## 2013-10-09 ENCOUNTER — Other Ambulatory Visit: Payer: Self-pay | Admitting: Internal Medicine

## 2013-10-09 ENCOUNTER — Encounter (HOSPITAL_COMMUNITY): Payer: Self-pay

## 2013-10-09 ENCOUNTER — Ambulatory Visit (HOSPITAL_COMMUNITY)
Admission: RE | Admit: 2013-10-09 | Discharge: 2013-10-09 | Disposition: A | Discharge status: Home / Self Care | Payer: Medicare Other | Source: Ambulatory Visit | Attending: Internal Medicine | Admitting: Internal Medicine

## 2013-10-09 DIAGNOSIS — Z87898 Personal history of other specified conditions: Secondary | ICD-10-CM | POA: Insufficient documentation

## 2013-10-09 DIAGNOSIS — R630 Anorexia: Secondary | ICD-10-CM

## 2013-10-09 DIAGNOSIS — R634 Abnormal weight loss: Secondary | ICD-10-CM | POA: Insufficient documentation

## 2013-10-09 DIAGNOSIS — Z8579 Personal history of other malignant neoplasms of lymphoid, hematopoietic and related tissues: Secondary | ICD-10-CM

## 2013-10-09 MED ORDER — MIRTAZAPINE 15 MG PO TABS: 7.5000 mg | ORAL_TABLET | Freq: Every day | ORAL | Status: DC

## 2013-10-09 MED ORDER — IOHEXOL 300 MG/ML  SOLN
100.0000 mL | Freq: Once | INTRAMUSCULAR | Status: AC | PRN
  Administered 2013-10-09: 100 mL via INTRAVENOUS

## 2013-10-10 ENCOUNTER — Telehealth: Payer: Self-pay

## 2013-10-10 ENCOUNTER — Telehealth: Payer: Self-pay | Admitting: Internal Medicine

## 2013-10-10 NOTE — Telephone Encounter (Signed)
Pt informed that ct scan results have been sent to pt urologist.

## 2013-10-10 NOTE — Telephone Encounter (Signed)
Patient is requesting labs to be sent over to [facility] attn: Dr.Borden

## 2013-10-11 ENCOUNTER — Telehealth: Payer: Self-pay | Admitting: Internal Medicine

## 2013-10-11 DIAGNOSIS — S12100K Unspecified displaced fracture of second cervical vertebra, subsequent encounter for fracture with nonunion: Secondary | ICD-10-CM

## 2013-10-11 NOTE — Telephone Encounter (Signed)
Pt request phone call from Dr. Asa Lente her self, pt stated this is a confidential and he will only speak to Dr. Asa Lente. He can be reach at [phone] from 10-10:30 and 11-12 am, after this time please call [phone] at home. Please call pt

## 2013-10-11 NOTE — Telephone Encounter (Signed)
Pt concerned about nonunion, lack of healing of odontoid fx after almost 82mo time including bone stimulator via Nsurg tx (injury early 04/2013) Would like second opinion on tx options Will review options - may need refer to tertiary center for new NSurg opinion No pain, but does not wish to continue collar Will make referral as requested after review

## 2013-10-25 ENCOUNTER — Other Ambulatory Visit: Payer: Self-pay

## 2013-10-25 MED ORDER — CYPROHEPTADINE HCL 4 MG PO TABS: 4.0000 mg | ORAL_TABLET | ORAL | Status: DC | PRN

## 2013-11-01 ENCOUNTER — Telehealth: Payer: Self-pay | Admitting: Internal Medicine

## 2013-11-01 DIAGNOSIS — S12110K Anterior displaced Type II dens fracture, subsequent encounter for fracture with nonunion: Secondary | ICD-10-CM

## 2013-11-01 NOTE — Telephone Encounter (Signed)
Patient states he spoke with Dr. Asa Lente about a referral to neurology.  He did not want to go to [facility] neurology.  He has an appoint with [facility] neurology.  He is requesting a referral to [facility] or another office close.

## 2013-11-23 ENCOUNTER — Other Ambulatory Visit: Payer: Self-pay | Admitting: Internal Medicine

## 2013-11-23 ENCOUNTER — Other Ambulatory Visit: Payer: Self-pay | Admitting: Cardiology

## 2013-12-06 ENCOUNTER — Other Ambulatory Visit (HOSPITAL_COMMUNITY): Payer: Self-pay | Admitting: Neurosurgery

## 2013-12-06 DIAGNOSIS — S129XXA Fracture of neck, unspecified, initial encounter: Secondary | ICD-10-CM

## 2013-12-10 ENCOUNTER — Ambulatory Visit (HOSPITAL_COMMUNITY)
Admission: RE | Admit: 2013-12-10 | Discharge: 2013-12-10 | Disposition: A | Discharge status: Home / Self Care | Payer: Medicare Other | Source: Ambulatory Visit | Attending: Neurosurgery | Admitting: Neurosurgery

## 2013-12-10 DIAGNOSIS — S12110G Anterior displaced Type II dens fracture, subsequent encounter for fracture with delayed healing: Secondary | ICD-10-CM | POA: Diagnosis not present

## 2013-12-10 DIAGNOSIS — S129XXA Fracture of neck, unspecified, initial encounter: Secondary | ICD-10-CM

## 2013-12-10 DIAGNOSIS — Z8551 Personal history of malignant neoplasm of bladder: Secondary | ICD-10-CM | POA: Diagnosis not present

## 2013-12-10 DIAGNOSIS — I700 Atherosclerosis of aorta: Secondary | ICD-10-CM | POA: Insufficient documentation

## 2013-12-10 DIAGNOSIS — Z8572 Personal history of non-Hodgkin lymphomas: Secondary | ICD-10-CM | POA: Diagnosis not present

## 2013-12-10 DIAGNOSIS — X58XXXD Exposure to other specified factors, subsequent encounter: Secondary | ICD-10-CM | POA: Insufficient documentation

## 2013-12-10 DIAGNOSIS — I10 Essential (primary) hypertension: Secondary | ICD-10-CM | POA: Insufficient documentation

## 2013-12-10 DIAGNOSIS — M542 Cervicalgia: Secondary | ICD-10-CM | POA: Diagnosis present

## 2013-12-10 DIAGNOSIS — M5030 Other cervical disc degeneration, unspecified cervical region: Secondary | ICD-10-CM | POA: Insufficient documentation

## 2013-12-10 DIAGNOSIS — M8938 Hypertrophy of bone, other site: Secondary | ICD-10-CM | POA: Diagnosis not present

## 2014-02-05 DIAGNOSIS — Z6829 Body mass index (BMI) 29.0-29.9, adult: Secondary | ICD-10-CM | POA: Diagnosis not present

## 2014-02-05 DIAGNOSIS — I10 Essential (primary) hypertension: Secondary | ICD-10-CM | POA: Diagnosis not present

## 2014-02-05 DIAGNOSIS — S12100G Unspecified displaced fracture of second cervical vertebra, subsequent encounter for fracture with delayed healing: Secondary | ICD-10-CM | POA: Diagnosis not present

## 2014-02-11 ENCOUNTER — Encounter: Payer: Self-pay | Admitting: Internal Medicine

## 2014-02-11 ENCOUNTER — Ambulatory Visit: Payer: Medicare Other | Admitting: Internal Medicine

## 2014-02-11 ENCOUNTER — Ambulatory Visit (INDEPENDENT_AMBULATORY_CARE_PROVIDER_SITE_OTHER): Payer: Medicare Other | Admitting: Internal Medicine

## 2014-02-11 VITALS — BP 140/80 | HR 75 | Temp 98.1°F | Ht 60.0 in | Wt 178.0 lb

## 2014-02-11 DIAGNOSIS — I10 Essential (primary) hypertension: Secondary | ICD-10-CM

## 2014-02-11 NOTE — Patient Instructions (Signed)
Minimal Blood Pressure Goal= AVERAGE < 140/90;  Ideal is an AVERAGE < 135/85. This AVERAGE should be calculated from @ least 5 BP readings taken @ different times of day on different days of week. You should not respond to isolated BP readings , but rather the AVERAGE for that week .Please bring your  blood pressure cuff to office visits to verify that it is reliable.It  can also be checked against the blood pressure device at the pharmacy. Finger or wrist cuffs are not dependable; an arm cuff is.

## 2014-02-11 NOTE — Progress Notes (Signed)
   Subjective:    Patient ID: David Howard, male    DOB: [birth year], 81 y.o.   MRN: [id number]  HPI He is here for follow-up of his blood pressure; he's not monitoring his blood pressure at home. He is on a heart healthy diet & restricts salt.  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   His BUNs had been 28 in July 2015. At that time glucose was 110; A1c was nondiabetic at 5.2%. At that time lipids were at goal  Review of Systems   Chest pain, palpitations, tachycardia, exertional dyspnea, paroxysmal nocturnal dyspnea, or claudication are absent.Minor edema LLE      Objective:   Physical Exam Appears healthy and well-nourished & in no acute distress.Hearing aid on R  No carotid bruits are present.No neck vein distention present at 10 - 15 degrees. Thyroid normal to palpation  Heart rhythm and rate are normal with no gallop or murmur  Chest is clear with no increased work of breathing  There is no evidence of aortic aneurysm or renal artery bruits  Abdomen soft with no organomegaly or masses. No HJR  No clubbing or cyanosis present. Trace L ankle edema.  Pedal pulses are intact but DPP decreased > PTP   No ischemic skin changes are present . Fingernails healthy   Alert and oriented. Strength or tone normal.Decreased DTRs @ knees.  Thorax sits or hips.        Assessment & Plan:  #1 HTN controlled Plan : no change CPX in 7/16 with Dr Doug Sou

## 2014-02-11 NOTE — Progress Notes (Signed)
Pre visit review using our clinic review tool, if applicable. No additional management support is needed unless otherwise documented below in the visit note. 

## 2014-02-27 DIAGNOSIS — M4012 Other secondary kyphosis, cervical region: Secondary | ICD-10-CM | POA: Diagnosis not present

## 2014-02-27 DIAGNOSIS — S12000G Unspecified displaced fracture of first cervical vertebra, subsequent encounter for fracture with delayed healing: Secondary | ICD-10-CM | POA: Diagnosis not present

## 2014-02-27 DIAGNOSIS — M5032 Other cervical disc degeneration, mid-cervical region: Secondary | ICD-10-CM | POA: Diagnosis not present

## 2014-03-11 ENCOUNTER — Ambulatory Visit (INDEPENDENT_AMBULATORY_CARE_PROVIDER_SITE_OTHER): Payer: Medicare Other | Admitting: *Deleted

## 2014-03-11 DIAGNOSIS — Z23 Encounter for immunization: Secondary | ICD-10-CM | POA: Diagnosis not present

## 2014-03-12 ENCOUNTER — Ambulatory Visit: Payer: Medicare Other

## 2014-03-22 DIAGNOSIS — N401 Enlarged prostate with lower urinary tract symptoms: Secondary | ICD-10-CM | POA: Diagnosis not present

## 2014-03-22 DIAGNOSIS — R3916 Straining to void: Secondary | ICD-10-CM | POA: Diagnosis not present

## 2014-03-22 DIAGNOSIS — R972 Elevated prostate specific antigen [PSA]: Secondary | ICD-10-CM | POA: Diagnosis not present

## 2014-04-05 DIAGNOSIS — D692 Other nonthrombocytopenic purpura: Secondary | ICD-10-CM | POA: Diagnosis not present

## 2014-04-05 DIAGNOSIS — L821 Other seborrheic keratosis: Secondary | ICD-10-CM | POA: Diagnosis not present

## 2014-04-05 DIAGNOSIS — L570 Actinic keratosis: Secondary | ICD-10-CM | POA: Diagnosis not present

## 2014-05-17 ENCOUNTER — Other Ambulatory Visit: Payer: Self-pay | Admitting: Internal Medicine

## 2014-06-18 ENCOUNTER — Other Ambulatory Visit: Payer: Self-pay | Admitting: Cardiology

## 2014-07-16 ENCOUNTER — Telehealth: Payer: Self-pay | Admitting: Oncology

## 2014-07-16 ENCOUNTER — Ambulatory Visit (HOSPITAL_BASED_OUTPATIENT_CLINIC_OR_DEPARTMENT_OTHER): Payer: Medicare Other | Admitting: Oncology

## 2014-07-16 VITALS — BP 150/84 | HR 92 | Temp 98.7°F | Resp 18 | Ht 60.0 in | Wt 192.5 lb

## 2014-07-16 DIAGNOSIS — H903 Sensorineural hearing loss, bilateral: Secondary | ICD-10-CM | POA: Diagnosis not present

## 2014-07-16 DIAGNOSIS — Z8572 Personal history of non-Hodgkin lymphomas: Secondary | ICD-10-CM

## 2014-07-16 DIAGNOSIS — C8590 Non-Hodgkin lymphoma, unspecified, unspecified site: Secondary | ICD-10-CM

## 2014-07-16 NOTE — Telephone Encounter (Signed)
Lft msg for pt confirming MD visit for next year per 06/28 POF, mailed out schedule to pt... KJ

## 2014-07-16 NOTE — Progress Notes (Signed)
  [facility] OFFICE PROGRESS NOTE   Diagnosis: Non-Hodgkin's lymphoma  INTERVAL HISTORY:   David Howard returns as scheduled. Good appetite. No fever or night sweats. No suspicious skin lesions. He has retired from full-time work as an Optometrist.  Objective:  Vital signs in last 24 hours:  Blood pressure 150/84, pulse 92, temperature 98.7 F (37.1 C), temperature source Oral, resp. rate 18, height 5' (1.524 m), weight 192 lb 8 oz (87.317 kg), SpO2 97 %.    HEENT: Neck without mass Lymphatics: No cervical, supra-clavicular, axillary, or inguinal nodes Resp: End inspiratory rales at the left posterior base, no respiratory distress Cardio: Regular rate and rhythm GI: No hepatosplenomegaly, nontender, no mass Vascular: No leg edema  Skin: No suspicious skin lesion noted. Areas of hyperpigmentation at the right back and right lateral chest wall without nodularity. Musculoskeletal: The neck is kyphotic  Medications: I have reviewed the patient's current medications.  Assessment/Plan: 1. Primary cutaneous B-cell lymphoma, status post primary radiation in 2003. No clinical evidence for recurrent disease. 2. Chronic prominence at the right medial scapula, likely benign asymmetry of the back musculature. 3. History of benign prostatic hypertrophy and superficial bladder cancer, followed by Dr. Alinda Money. 4. History of a right inguinal hernia, status post repair by Dr. Zella Richer.    Disposition:  David Howard remains in remission from non-Hodgkin's lymphoma. He would like to continue follow-up at the [facility]. He will return for an office visit in one year.  Betsy Coder, MD  07/16/2014  11:36 AM

## 2014-07-30 ENCOUNTER — Other Ambulatory Visit (INDEPENDENT_AMBULATORY_CARE_PROVIDER_SITE_OTHER): Payer: Medicare Other

## 2014-07-30 ENCOUNTER — Encounter: Payer: Self-pay | Admitting: Internal Medicine

## 2014-07-30 ENCOUNTER — Ambulatory Visit (INDEPENDENT_AMBULATORY_CARE_PROVIDER_SITE_OTHER): Payer: Medicare Other | Admitting: Internal Medicine

## 2014-07-30 VITALS — BP 140/70 | HR 77 | Temp 98.1°F | Resp 12 | Ht 60.0 in | Wt 192.0 lb

## 2014-07-30 DIAGNOSIS — Z0000 Encounter for general adult medical examination without abnormal findings: Secondary | ICD-10-CM | POA: Diagnosis not present

## 2014-07-30 DIAGNOSIS — E789 Disorder of lipoprotein metabolism, unspecified: Secondary | ICD-10-CM | POA: Diagnosis not present

## 2014-07-30 DIAGNOSIS — I10 Essential (primary) hypertension: Secondary | ICD-10-CM

## 2014-07-30 LAB — LIPID PANEL
CHOL/HDL RATIO: 3
CHOLESTEROL: 175 mg/dL (ref 0–200)
HDL: 67 mg/dL (ref >39.00)
LDL Cholesterol: 92 mg/dL (ref 0–99)
NONHDL: 108
Triglycerides: 79 mg/dL (ref 0.0–149.0)
VLDL: 15.8 mg/dL (ref 0.0–40.0)

## 2014-07-30 LAB — COMPREHENSIVE METABOLIC PANEL
ALT: 14 U/L (ref 0–53)
AST: 21 U/L (ref 0–37)
Albumin: 4 g/dL (ref 3.5–5.2)
Alkaline Phosphatase: 42 U/L (ref 39–117)
BUN: 20 mg/dL (ref 6–23)
CALCIUM: 9.4 mg/dL (ref 8.4–10.5)
CO2: 29 mEq/L (ref 19–32)
Chloride: 101 mEq/L (ref 96–112)
Creatinine, Ser: 1.19 mg/dL (ref 0.40–1.50)
GFR: 61.18 mL/min (ref >60.00)
Glucose, Bld: 97 mg/dL (ref 70–99)
Potassium: 4 mEq/L (ref 3.5–5.1)
Sodium: 138 mEq/L (ref 135–145)
TOTAL PROTEIN: 7.1 g/dL (ref 6.0–8.3)
Total Bilirubin: 1.1 mg/dL (ref 0.2–1.2)

## 2014-07-30 MED ORDER — FLUTICASONE PROPIONATE 50 MCG/ACT NA SUSP: 2.0000 | Freq: Every day | NASAL | Status: DC

## 2014-07-30 NOTE — Progress Notes (Signed)
   Subjective:    Patient ID: David Howard, male    DOB: [birth year], 81 y.o.   MRN: [id number]  HPI Here for medicare wellness, no new complaints. Please see A/P for status and treatment of chronic medical problems.   Diet: heart healthy  Physical activity: sedentary Depression/mood screen: negative Hearing: intact to whispered voice Visual acuity: grossly normal, performs annual eye exam  ADLs: capable Fall risk: none Home safety: good Cognitive evaluation: intact to orientation, naming, recall and repetition EOL planning: adv directives discussed  I have personally reviewed and have noted 1. The patient's medical and social history - reviewed today no changes 2. Their use of alcohol, tobacco or illicit drugs 3. Their current medications and supplements 4. The patient's functional ability including ADL's, fall risks, home safety risks and hearing or visual impairment. 5. Diet and physical activities 6. Evidence for depression or mood disorders 7. Care team reviewed and updated (available in snapshot)  Review of Systems  Constitutional: Positive for activity change. Negative for fever, appetite change, fatigue and unexpected weight change.  HENT: Negative.   Eyes: Negative.   Respiratory: Negative for cough, chest tightness, shortness of breath and wheezing.   Cardiovascular: Negative for chest pain, palpitations and leg swelling.  Gastrointestinal: Negative for nausea, abdominal pain, diarrhea, constipation and abdominal distention.  Musculoskeletal: Negative.   Skin: Negative.   Neurological: Negative.   Psychiatric/Behavioral: Negative.       Objective:   Physical Exam  Constitutional: He is oriented to person, place, and time. He appears well-developed and well-nourished.  HENT:  Head: Normocephalic and atraumatic.  Eyes: EOM are normal.  Neck: Normal range of motion.  Cardiovascular: Normal rate and regular rhythm.   Pulmonary/Chest: Effort normal and breath  sounds normal.  Abdominal: Soft. He exhibits no distension. There is no tenderness. There is no rebound.  Musculoskeletal: He exhibits no edema.  Neurological: He is alert and oriented to person, place, and time. Coordination abnormal.  Slow to rise, some hesitating gait.  Skin: Skin is warm and dry.   Filed Vitals:   07/30/14 0853  BP: 140/70  Pulse: 77  Temp: 98.1 F (36.7 C)  TempSrc: Oral  Resp: 12  Height: 5' (1.524 m)  Weight: 192 lb (87.091 kg)  SpO2: 97%      Assessment & Plan:

## 2014-07-30 NOTE — Progress Notes (Signed)
Pre visit review using our clinic review tool, if applicable. No additional management support is needed unless otherwise documented below in the visit note. 

## 2014-07-30 NOTE — Assessment & Plan Note (Signed)
Talked to him about exercise to help with his mobility and balance. Given chair exercises. Also given 10 year screening recommendations. Has aged out of colon screening and up to date on immunizations. Also talked to them about home safety today.

## 2014-07-30 NOTE — Patient Instructions (Signed)
We will have you start trying some chair exercises to get a little more active while still being steady on your feet. The other thing to consider is that some [facility] facilities have access to free chair yoga or balance classes that can make you better with walking.   We will check the blood work today and call you back about the results.   Keep up the good work with your health.   Health Maintenance A healthy lifestyle and preventative care can promote health and wellness.  Maintain regular health, dental, and eye exams.  Eat a healthy diet. Foods like vegetables, fruits, whole grains, low-fat dairy products, and lean protein foods contain the nutrients you need and are low in calories. Decrease your intake of foods high in solid fats, added sugars, and salt. Get information about a proper diet from your health care provider, if necessary.  Regular physical exercise is one of the most important things you can do for your health. Most adults should get at least 150 minutes of moderate-intensity exercise (any activity that increases your heart rate and causes you to sweat) each week. In addition, most adults need muscle-strengthening exercises on 2 or more days a week.   Maintain a healthy weight. The body mass index (BMI) is a screening tool to identify possible weight problems. It provides an estimate of body fat based on height and weight. Your health care provider can find your BMI and can help you achieve or maintain a healthy weight. For males 20 years and older:  A BMI below 18.5 is considered underweight.  A BMI of 18.5 to 24.9 is normal.  A BMI of 25 to 29.9 is considered overweight.  A BMI of 30 and above is considered obese.  Maintain normal blood lipids and cholesterol by exercising and minimizing your intake of saturated fat. Eat a balanced diet with plenty of fruits and vegetables. Blood tests for lipids and cholesterol should begin at age 30 and be repeated every 5 years. If your  lipid or cholesterol levels are high, you are over age 72, or you are at high risk for heart disease, you may need your cholesterol levels checked more frequently.Ongoing high lipid and cholesterol levels should be treated with medicines if diet and exercise are not working.  If you smoke, find out from your health care provider how to quit. If you do not use tobacco, do not start.  Lung cancer screening is recommended for adults aged 21-80 years who are at high risk for developing lung cancer because of a history of smoking. A yearly low-dose CT scan of the lungs is recommended for people who have at least a 30-pack-year history of smoking and are current smokers or have quit within the past 15 years. A pack year of smoking is smoking an average of 1 pack of cigarettes a day for 1 year (for example, a 30-pack-year history of smoking could mean smoking 1 pack a day for 30 years or 2 packs a day for 15 years). Yearly screening should continue until the smoker has stopped smoking for at least 15 years. Yearly screening should be stopped for people who develop a health problem that would prevent them from having lung cancer treatment.  If you choose to drink alcohol, do not have more than 2 drinks per day. One drink is considered to be 12 oz (360 mL) of beer, 5 oz (150 mL) of wine, or 1.5 oz (45 mL) of liquor.  Avoid the use of  street drugs. Do not share needles with anyone. Ask for help if you need support or instructions about stopping the use of drugs.  High blood pressure causes heart disease and increases the risk of stroke. Blood pressure should be checked at least every 1-2 years. Ongoing high blood pressure should be treated with medicines if weight loss and exercise are not effective.  If you are 46-77 years old, ask your health care provider if you should take aspirin to prevent heart disease.  Diabetes screening involves taking a blood sample to check your fasting blood sugar level. This  should be done once every 3 years after age 64 if you are at a normal weight and without risk factors for diabetes. Testing should be considered at a younger age or be carried out more frequently if you are overweight and have at least 1 risk factor for diabetes.  Colorectal cancer can be detected and often prevented. Most routine colorectal cancer screening begins at the age of 68 and continues through age 45. However, your health care provider may recommend screening at an earlier age if you have risk factors for colon cancer. On a yearly basis, your health care provider may provide home test kits to check for hidden blood in the stool. A small camera at the end of a tube may be used to directly examine the colon (sigmoidoscopy or colonoscopy) to detect the earliest forms of colorectal cancer. Talk to your health care provider about this at age 53 when routine screening begins. A direct exam of the colon should be repeated every 5-10 years through age 75, unless early forms of precancerous polyps or small growths are found.  People who are at an increased risk for hepatitis B should be screened for this virus. You are considered at high risk for hepatitis B if:  You were born in a country where hepatitis B occurs often. Talk with your health care provider about which countries are considered high risk.  Your parents were born in a high-risk country and you have not received a shot to protect against hepatitis B (hepatitis B vaccine).  You have HIV or AIDS.  You use needles to inject street drugs.  You live with, or have sex with, someone who has hepatitis B.  You are a man who has sex with other men (MSM).  You get hemodialysis treatment.  You take certain medicines for conditions like cancer, organ transplantation, and autoimmune conditions.  Hepatitis C blood testing is recommended for all people born from [birth year] through 1963 and any individual with known risk factors for hepatitis  C.  Healthy men should no longer receive prostate-specific antigen (PSA) blood tests as part of routine cancer screening. Talk to your health care provider about prostate cancer screening.  Testicular cancer screening is not recommended for adolescents or adult males who have no symptoms. Screening includes self-exam, a health care provider exam, and other screening tests. Consult with your health care provider about any symptoms you have or any concerns you have about testicular cancer.  Practice safe sex. Use condoms and avoid high-risk sexual practices to reduce the spread of sexually transmitted infections (STIs).  You should be screened for STIs, including gonorrhea and chlamydia if:  You are sexually active and are younger than 24 years.  You are older than 24 years, and your health care provider tells you that you are at risk for this type of infection.  Your sexual activity has changed since you were last screened, and  you are at an increased risk for chlamydia or gonorrhea. Ask your health care provider if you are at risk.  If you are at risk of being infected with HIV, it is recommended that you take a prescription medicine daily to prevent HIV infection. This is called pre-exposure prophylaxis (PrEP). You are considered at risk if:  You are a man who has sex with other men (MSM).  You are a heterosexual man who is sexually active with multiple partners.  You take drugs by injection.  You are sexually active with a partner who has HIV.  Talk with your health care provider about whether you are at high risk of being infected with HIV. If you choose to begin PrEP, you should first be tested for HIV. You should then be tested every 3 months for as long as you are taking PrEP.  Use sunscreen. Apply sunscreen liberally and repeatedly throughout the day. You should seek shade when your shadow is shorter than you. Protect yourself by wearing long sleeves, pants, a wide-brimmed hat, and  sunglasses year round whenever you are outdoors.  Tell your health care provider of new moles or changes in moles, especially if there is a change in shape or color. Also, tell your health care provider if a mole is larger than the size of a pencil eraser.  A one-time screening for abdominal aortic aneurysm (AAA) and surgical repair of large AAAs by ultrasound is recommended for men aged 64-75 years who are current or former smokers.  Stay current with your vaccines (immunizations). Document Released: 07/03/2007 Document Revised: 01/09/2013 Document Reviewed: 06/01/2010 [facility] Patient Information 2015 [place], [place]. This information is not intended to replace advice given to you by your health care provider. Make sure you discuss any questions you have with your health care provider.

## 2014-07-30 NOTE — Assessment & Plan Note (Signed)
BP controlled on current regimen, check BMP and adjust cardura and lisinopril if needed.

## 2014-09-11 DIAGNOSIS — H3531 Nonexudative age-related macular degeneration: Secondary | ICD-10-CM | POA: Diagnosis not present

## 2014-09-11 DIAGNOSIS — Z961 Presence of intraocular lens: Secondary | ICD-10-CM | POA: Diagnosis not present

## 2014-09-11 DIAGNOSIS — H40013 Open angle with borderline findings, low risk, bilateral: Secondary | ICD-10-CM | POA: Diagnosis not present

## 2014-09-11 DIAGNOSIS — H04123 Dry eye syndrome of bilateral lacrimal glands: Secondary | ICD-10-CM | POA: Diagnosis not present

## 2014-09-20 ENCOUNTER — Other Ambulatory Visit: Payer: Self-pay | Admitting: Internal Medicine

## 2014-10-03 ENCOUNTER — Ambulatory Visit (INDEPENDENT_AMBULATORY_CARE_PROVIDER_SITE_OTHER): Payer: Medicare Other

## 2014-10-03 DIAGNOSIS — Z23 Encounter for immunization: Secondary | ICD-10-CM

## 2014-10-31 ENCOUNTER — Emergency Department (HOSPITAL_COMMUNITY)
Admission: EM | Admit: 2014-10-31 | Discharge: 2014-10-31 | Disposition: A | Discharge status: Home / Self Care | Payer: Medicare Other | Attending: Emergency Medicine | Admitting: Emergency Medicine

## 2014-10-31 ENCOUNTER — Encounter (HOSPITAL_COMMUNITY): Payer: Self-pay | Admitting: Emergency Medicine

## 2014-10-31 ENCOUNTER — Emergency Department (HOSPITAL_COMMUNITY): Payer: Medicare Other

## 2014-10-31 DIAGNOSIS — IMO0002 Reserved for concepts with insufficient information to code with codable children: Secondary | ICD-10-CM

## 2014-10-31 DIAGNOSIS — W01198A Fall on same level from slipping, tripping and stumbling with subsequent striking against other object, initial encounter: Secondary | ICD-10-CM | POA: Diagnosis not present

## 2014-10-31 DIAGNOSIS — Z79899 Other long term (current) drug therapy: Secondary | ICD-10-CM | POA: Diagnosis not present

## 2014-10-31 DIAGNOSIS — Z8579 Personal history of other malignant neoplasms of lymphoid, hematopoietic and related tissues: Secondary | ICD-10-CM | POA: Diagnosis not present

## 2014-10-31 DIAGNOSIS — R55 Syncope and collapse: Secondary | ICD-10-CM

## 2014-10-31 DIAGNOSIS — I10 Essential (primary) hypertension: Secondary | ICD-10-CM | POA: Diagnosis not present

## 2014-10-31 DIAGNOSIS — Z86018 Personal history of other benign neoplasm: Secondary | ICD-10-CM | POA: Insufficient documentation

## 2014-10-31 DIAGNOSIS — S098XXA Other specified injuries of head, initial encounter: Secondary | ICD-10-CM | POA: Diagnosis not present

## 2014-10-31 DIAGNOSIS — Z7982 Long term (current) use of aspirin: Secondary | ICD-10-CM | POA: Insufficient documentation

## 2014-10-31 DIAGNOSIS — Z23 Encounter for immunization: Secondary | ICD-10-CM | POA: Diagnosis not present

## 2014-10-31 DIAGNOSIS — S0181XA Laceration without foreign body of other part of head, initial encounter: Secondary | ICD-10-CM | POA: Diagnosis not present

## 2014-10-31 DIAGNOSIS — Z8551 Personal history of malignant neoplasm of bladder: Secondary | ICD-10-CM | POA: Insufficient documentation

## 2014-10-31 DIAGNOSIS — Z87891 Personal history of nicotine dependence: Secondary | ICD-10-CM | POA: Insufficient documentation

## 2014-10-31 DIAGNOSIS — Y998 Other external cause status: Secondary | ICD-10-CM | POA: Insufficient documentation

## 2014-10-31 DIAGNOSIS — Z8739 Personal history of other diseases of the musculoskeletal system and connective tissue: Secondary | ICD-10-CM | POA: Insufficient documentation

## 2014-10-31 DIAGNOSIS — S12110A Anterior displaced Type II dens fracture, initial encounter for closed fracture: Secondary | ICD-10-CM | POA: Diagnosis not present

## 2014-10-31 DIAGNOSIS — Z8719 Personal history of other diseases of the digestive system: Secondary | ICD-10-CM | POA: Insufficient documentation

## 2014-10-31 DIAGNOSIS — Z7951 Long term (current) use of inhaled steroids: Secondary | ICD-10-CM | POA: Insufficient documentation

## 2014-10-31 DIAGNOSIS — R51 Headache: Secondary | ICD-10-CM | POA: Diagnosis not present

## 2014-10-31 DIAGNOSIS — H9190 Unspecified hearing loss, unspecified ear: Secondary | ICD-10-CM | POA: Diagnosis not present

## 2014-10-31 DIAGNOSIS — Y93E1 Activity, personal bathing and showering: Secondary | ICD-10-CM | POA: Diagnosis not present

## 2014-10-31 DIAGNOSIS — K219 Gastro-esophageal reflux disease without esophagitis: Secondary | ICD-10-CM | POA: Diagnosis not present

## 2014-10-31 DIAGNOSIS — Y92091 Bathroom in other non-institutional residence as the place of occurrence of the external cause: Secondary | ICD-10-CM | POA: Diagnosis not present

## 2014-10-31 DIAGNOSIS — S0993XA Unspecified injury of face, initial encounter: Secondary | ICD-10-CM | POA: Diagnosis present

## 2014-10-31 DIAGNOSIS — S0010XA Contusion of unspecified eyelid and periocular area, initial encounter: Secondary | ICD-10-CM | POA: Diagnosis not present

## 2014-10-31 LAB — TROPONIN I: Troponin I: <0.03 ng/mL (ref <0.031)

## 2014-10-31 LAB — URINALYSIS, ROUTINE W REFLEX MICROSCOPIC
BILIRUBIN URINE: NEGATIVE
Glucose, UA: NEGATIVE mg/dL
HGB URINE DIPSTICK: NEGATIVE
Ketones, ur: NEGATIVE mg/dL
Leukocytes, UA: NEGATIVE
Nitrite: NEGATIVE
PH: 7.5 (ref 5.0–8.0)
Protein, ur: NEGATIVE mg/dL
SPECIFIC GRAVITY, URINE: 1.014 (ref 1.005–1.030)
Urobilinogen, UA: 0.2 mg/dL (ref 0.0–1.0)

## 2014-10-31 LAB — CBC WITH DIFFERENTIAL/PLATELET
Basophils Absolute: 0 10*3/uL (ref 0.0–0.1)
Basophils Relative: 0 %
EOS PCT: 4 %
Eosinophils Absolute: 0.3 10*3/uL (ref 0.0–0.7)
HCT: 39.9 % (ref 39.0–52.0)
Hemoglobin: 13.6 g/dL (ref 13.0–17.0)
LYMPHS ABS: 1.4 10*3/uL (ref 0.7–4.0)
LYMPHS PCT: 19 %
MCH: 29.7 pg (ref 26.0–34.0)
MCHC: 34.1 g/dL (ref 30.0–36.0)
MCV: 87.1 fL (ref 78.0–100.0)
Monocytes Absolute: 0.3 10*3/uL (ref 0.1–1.0)
Monocytes Relative: 4 %
Neutro Abs: 5.3 10*3/uL (ref 1.7–7.7)
Neutrophils Relative %: 73 %
Platelets: 138 10*3/uL — ABNORMAL LOW (ref 150–400)
RBC: 4.58 MIL/uL (ref 4.22–5.81)
RDW: 13.3 % (ref 11.5–15.5)
WBC: 7.2 10*3/uL (ref 4.0–10.5)

## 2014-10-31 LAB — COMPREHENSIVE METABOLIC PANEL
ALT: 15 U/L — AB (ref 17–63)
AST: 27 U/L (ref 15–41)
Albumin: 3.8 g/dL (ref 3.5–5.0)
Alkaline Phosphatase: 40 U/L (ref 38–126)
Anion gap: 8 (ref 5–15)
BUN: 20 mg/dL (ref 6–20)
CO2: 25 mmol/L (ref 22–32)
CREATININE: 1.22 mg/dL (ref 0.61–1.24)
Calcium: 9 mg/dL (ref 8.9–10.3)
Chloride: 103 mmol/L (ref 101–111)
GFR calc Af Amer: 59 mL/min — ABNORMAL LOW (ref >60)
GFR, EST NON AFRICAN AMERICAN: 51 mL/min — AB (ref >60)
Glucose, Bld: 158 mg/dL — ABNORMAL HIGH (ref 65–99)
Potassium: 3.7 mmol/L (ref 3.5–5.1)
Sodium: 136 mmol/L (ref 135–145)
Total Bilirubin: 1.1 mg/dL (ref 0.3–1.2)
Total Protein: 7 g/dL (ref 6.5–8.1)

## 2014-10-31 MED ORDER — TETANUS-DIPHTH-ACELL PERTUSSIS 5-2.5-18.5 LF-MCG/0.5 IM SUSP
0.5000 mL | Freq: Once | INTRAMUSCULAR | Status: AC
  Administered 2014-10-31: 0.5 mL via INTRAMUSCULAR
  Filled 2014-10-31: qty 0.5

## 2014-10-31 MED ORDER — LIDOCAINE-EPINEPHRINE 2 %-1:100000 IJ SOLN
20.0000 mL | Freq: Once | INTRAMUSCULAR | Status: AC
  Administered 2014-10-31: 20 mL
  Filled 2014-10-31: qty 1

## 2014-10-31 NOTE — ED Provider Notes (Signed)
CSN: [id number]     Arrival date & time 10/31/14  1093 History   First MD Initiated Contact with Patient 10/31/14 0754     Chief Complaint  Patient presents with  . Fall     (Consider location/radiation/quality/duration/timing/severity/associated sxs/prior Treatment) HPI Comments: Patient presents to the emergency department for evaluation after a fall. Patient reports that he started to feel weak when he was in the shower. He stepped out of the shower and tried to sit down on the toilet seat, fell forward into the shower. Family reports that they think he did briefly pass out. Patient denies any chest pain, shortness of breath. This happened 1 year ago as well, no cause was identified, but he did break a bone in his neck when he fell. Patient transported by EMS, refused cervical collar for EMS. He denies neck pain.  Patient is a 81 y.o. male presenting with fall.  Fall Pertinent negatives include no chest pain, no headaches and no shortness of breath.    Past Medical History  Diagnosis Date  . Unspecified essential hypertension   . Peripheral vascular disease, unspecified ([place])   . Other abnormal glucose   . Esophageal reflux   . Diverticulosis of colon (without mention of hemorrhage)   . Internal hemorrhoids without mention of complication   . Benign neoplasm of colon   . Inguinal hernia without mention of obstruction or gangrene, unilateral or unspecified, (not specified as recurrent)   . Elevated prostate specific antigen (PSA)     BPH, follows with uro  . Malignant neoplasm of bladder, part unspecified   . Spinal stenosis, unspecified region other than cervical     R>L LE pain  . Personal history of other lymphatic and hematopoietic neoplasm 2003    Primary cutaneous B-cell lymphoma, s/p xrt  . Hearing loss     L>R, follows with ENT, wears hearing aides   Past Surgical History  Procedure Laterality Date  . Tonsillectomy    . Cataract extraction    . Laparoscopy    .  Pilonidal cyst / sinus excision     Family History  Problem Relation Age of Onset  . Kidney disease Father    Social History  Substance Use Topics  . Smoking status: Former Smoker    Types: Cigarettes  . Smokeless tobacco: Never Used  . Alcohol Use: Yes     Comment: social use    Review of Systems  Respiratory: Negative for shortness of breath.   Cardiovascular: Negative for chest pain.  Musculoskeletal: Negative for neck pain.  Skin: Positive for wound.  Neurological: Positive for syncope. Negative for headaches.  All other systems reviewed and are negative.     Allergies  Review of patient's allergies indicates no known allergies.  Home Medications   Prior to Admission medications   Medication Sig Start Date End Date Taking? Authorizing Provider  aspirin 81 MG tablet Take 81 mg by mouth every other day.    Yes Historical Provider, MD  Calcium Carbonate-Vitamin D (CALCIUM-VITAMIN D) 500-200 MG-UNIT per tablet Take 1 tablet by mouth 2 (two) times daily with a meal.    Yes Historical Provider, MD  Cholecalciferol (VITAMIN D3) 1000 UNITS CAPS Take 1 capsule by mouth daily.   Yes Historical Provider, MD  cyclobenzaprine (FLEXERIL) 10 MG tablet TAKE 1/2 TO 1 TAB 3 TIMES A DAY AS NEEDED FOR MUSCLE SPASM. 09/20/14  Yes Rowe Clack, MD  cyproheptadine (PERIACTIN) 4 MG tablet TAKE 1 TABLET AS NEEDED FOR  ALLERGY AS DIRECTED. 09/20/14  Yes Rowe Clack, MD  doxazosin (CARDURA) 2 MG tablet TAKE ONE TABLET AT BEDTIME. 05/17/14  Yes Rowe Clack, MD  finasteride (PROSCAR) 5 MG tablet Take 5 mg by mouth daily.     Yes Historical Provider, MD  fluticasone (FLONASE) 50 MCG/ACT nasal spray Place 2 sprays into both nostrils daily. 07/30/14  Yes Hoyt Koch, MD  lisinopril (PRINIVIL,ZESTRIL) 20 MG tablet TAKE (1) TABLET DAILY FOR HIGH BLOOD PRESSURE. 06/19/14  Yes Jerline Pain, MD  Multiple Vitamin (MULTIVITAMIN) capsule Take 1 capsule by mouth daily.     Yes Historical  Provider, MD  Omega-3 Fatty Acids (FISH OIL) 1000 MG CAPS Take 1 capsule by mouth every other day.    Yes Historical Provider, MD  psyllium (HYDROCIL/METAMUCIL) 95 % PACK Take 1 packet by mouth daily.   Yes Historical Provider, MD  docusate sodium (DULCOLAX) 100 MG capsule Take 1 capsule (100 mg total) by mouth 2 (two) times daily as needed for mild constipation or moderate constipation. Patient not taking: Reported on 10/31/2014 08/09/13   Rowe Clack, MD   BP 145/73 mmHg  Pulse 75  Temp(Src) 97.5 F (36.4 C) (Oral)  Resp 20  Ht 5\' 8"  (1.727 m)  Wt 180 lb (81.647 kg)  BMI 27.38 kg/m2  SpO2 96% Physical Exam  Constitutional: He is oriented to person, place, and time. He appears well-developed and well-nourished. No distress.  HENT:  Head: Normocephalic. Head is with laceration.    Right Ear: Hearing normal.  Left Ear: Hearing normal.  Nose: Nose normal.  Mouth/Throat: Oropharynx is clear and moist and mucous membranes are normal.  Eyes: Conjunctivae and EOM are normal. Pupils are equal, round, and reactive to light.  Neck: Normal range of motion. Neck supple.  Cardiovascular: Regular rhythm, S1 normal and S2 normal.  Exam reveals no gallop and no friction rub.   No murmur heard. Pulmonary/Chest: Effort normal and breath sounds normal. No respiratory distress. He exhibits no tenderness.  Abdominal: Soft. Normal appearance and bowel sounds are normal. There is no hepatosplenomegaly. There is no tenderness. There is no rebound, no guarding, no tenderness at McBurney's point and negative Murphy's sign. No hernia.  Musculoskeletal: Normal range of motion.  Neurological: He is alert and oriented to person, place, and time. He has normal strength. No cranial nerve deficit or sensory deficit. Coordination normal. GCS eye subscore is 4. GCS verbal subscore is 5. GCS motor subscore is 6.  Skin: Skin is warm and dry. Laceration (above left eye) noted. No rash noted. No cyanosis.    Psychiatric: He has a normal mood and affect. His speech is normal and behavior is normal. Thought content normal.  Nursing note and vitals reviewed.   ED Course  Procedures (including critical care time)  LACERATION REPAIR Performed by: Orpah Greek. Authorized by: Orpah Greek Consent: Verbal consent obtained. Risks and benefits: risks, benefits and alternatives were discussed Consent given by: patient Patient identity confirmed: provided demographic data Prepped and Draped in normal sterile fashion Wound explored  Laceration Location: face  Laceration Length: 3 cm  No Foreign Bodies seen or palpated  Anesthesia: local infiltration  Local anesthetic: lidocaine 2% with epinephrine  Anesthetic total: 2 ml  Irrigation method: syringe Amount of cleaning: standard  Skin closure: 4-0 vicryl buried, 2 horizonatal; 5-0 prolene simple interrupted, 5  Patient tolerance: Patient tolerated the procedure well with no immediate complications.   Labs Review Labs Reviewed  CBC WITH  DIFFERENTIAL/PLATELET - Abnormal; Notable for the following:    Platelets 138 (*)    All other components within normal limits  COMPREHENSIVE METABOLIC PANEL - Abnormal; Notable for the following:    Glucose, Bld 158 (*)    ALT 15 (*)    GFR calc non Af Amer 51 (*)    GFR calc Af Amer 59 (*)    All other components within normal limits  TROPONIN I  URINALYSIS, ROUTINE W REFLEX MICROSCOPIC (NOT AT [facility])    Imaging Review Ct Head Wo Contrast  10/31/2014  CLINICAL DATA:  Pain following fall. History of prior odontoid fracture EXAM: CT HEAD WITHOUT CONTRAST CT CERVICAL SPINE WITHOUT CONTRAST TECHNIQUE: Multidetector CT imaging of the head and cervical spine was performed following the standard protocol without intravenous contrast. Multiplanar CT image reconstructions of the cervical spine were also generated. COMPARISON:  CT cervical spine December 10, 2013 FINDINGS: CT HEAD  FINDINGS Moderate diffuse atrophy is stable 4. There is no intracranial mass, hemorrhage, extra-axial fluid collection, or midline shift. There is slight small vessel disease in the centra semiovale bilaterally. Gray-white compartments elsewhere are normal. No acute infarct is evident. The bony calvarium appears intact. Mastoid air cells are clear. CT CERVICAL SPINE FINDINGS The displaced type II odontoid fracture noted previously is again noted without appreciable healing response compared to the prior study from 11 months prior. The superior odontoid is displaced posterior to the remainder the odontoid by just over 2 mm, marginally increased compared to the previous study. The predental space remains normal. There is no new apparent fracture compared to the prior study. There is stable minimal retrolisthesis of C3 on C4. There is stable anterolisthesis of C4 on C5 by 4 mm, stable. There is no new spondylolisthesis. The prevertebral soft tissues are normal. There is marked disc space narrowing at C3-4, C4-5, and C7-T1. There is moderately severe narrowing at C5-6 and C6-7. There are anterior osteophytes at C3, C4, C5, C6, C7, T1, T2, and T3. There is multilevel bony hypertrophy, stable. Exit foraminal narrowing is noted to varying degrees at essentially all levels. These changes are most severe at C3-4 on the left and at C 5 6 on the right. There is no disc extrusion or high-grade stenosis. There is calcification in the left carotid artery. IMPRESSION: CT head: Moderate atrophy with mild periventricular small vessel disease. No intracranial mass, hemorrhage, or extra-axial fluid collection. No acute appearing infarct. CT cervical spine: The displaced fracture of the mid odontoid, type II, is again noted with marginally more distraction at the fracture site compared to the previous study from 11 months prior. There is no appreciable healing response in this area compared to the prior study from 11 months previously,  consistent with chronic nonunion. No new fracture is apparent. Areas of spondylolisthesis in the upper cervical spine are stable and felt to be due to underlying spondylosis. No new spondylolisthesis is seen. Extensive multifocal osteoarthritic change remains stable. Calcification is noted in the left carotid artery. Electronically Signed   By: Lowella Grip III M.D.   On: 10/31/2014 09:01   Ct Cervical Spine Wo Contrast  10/31/2014  CLINICAL DATA:  Pain following fall. History of prior odontoid fracture EXAM: CT HEAD WITHOUT CONTRAST CT CERVICAL SPINE WITHOUT CONTRAST TECHNIQUE: Multidetector CT imaging of the head and cervical spine was performed following the standard protocol without intravenous contrast. Multiplanar CT image reconstructions of the cervical spine were also generated. COMPARISON:  CT cervical spine December 10, 2013 FINDINGS:  CT HEAD FINDINGS Moderate diffuse atrophy is stable 4. There is no intracranial mass, hemorrhage, extra-axial fluid collection, or midline shift. There is slight small vessel disease in the centra semiovale bilaterally. Gray-white compartments elsewhere are normal. No acute infarct is evident. The bony calvarium appears intact. Mastoid air cells are clear. CT CERVICAL SPINE FINDINGS The displaced type II odontoid fracture noted previously is again noted without appreciable healing response compared to the prior study from 11 months prior. The superior odontoid is displaced posterior to the remainder the odontoid by just over 2 mm, marginally increased compared to the previous study. The predental space remains normal. There is no new apparent fracture compared to the prior study. There is stable minimal retrolisthesis of C3 on C4. There is stable anterolisthesis of C4 on C5 by 4 mm, stable. There is no new spondylolisthesis. The prevertebral soft tissues are normal. There is marked disc space narrowing at C3-4, C4-5, and C7-T1. There is moderately severe narrowing at  C5-6 and C6-7. There are anterior osteophytes at C3, C4, C5, C6, C7, T1, T2, and T3. There is multilevel bony hypertrophy, stable. Exit foraminal narrowing is noted to varying degrees at essentially all levels. These changes are most severe at C3-4 on the left and at C 5 6 on the right. There is no disc extrusion or high-grade stenosis. There is calcification in the left carotid artery. IMPRESSION: CT head: Moderate atrophy with mild periventricular small vessel disease. No intracranial mass, hemorrhage, or extra-axial fluid collection. No acute appearing infarct. CT cervical spine: The displaced fracture of the mid odontoid, type II, is again noted with marginally more distraction at the fracture site compared to the previous study from 11 months prior. There is no appreciable healing response in this area compared to the prior study from 11 months previously, consistent with chronic nonunion. No new fracture is apparent. Areas of spondylolisthesis in the upper cervical spine are stable and felt to be due to underlying spondylosis. No new spondylolisthesis is seen. Extensive multifocal osteoarthritic change remains stable. Calcification is noted in the left carotid artery. Electronically Signed   By: Lowella Grip III M.D.   On: 10/31/2014 09:01   I have personally reviewed and evaluated these images and lab results as part of my medical decision-making.   EKG Interpretation   Date/Time:  Thursday October 31 2014 08:17:34 EDT Ventricular Rate:  73 PR Interval:  240 QRS Duration: 106 QT Interval:  423 QTC Calculation: 466 R Axis:   -23 Text Interpretation:  Sinus arrhythmia Prolonged PR interval Left  ventricular hypertrophy No previous tracing Confirmed by Sayda Grable  MD,  Anaysia Germer ([id number]) on 10/31/2014 8:23:53 AM      MDM   Final diagnoses:  Syncope, unspecified syncope type  Laceration    Patient presents for evaluation of facial laceration after syncopal episode. Patient reports  feeling weak while in the shower. He got out of the shower and tried to sit down on the toilet. After he sat down he continued to feel more weak and then fell forward into the shower, hitting his face. There was brief loss of consciousness. Patient has had a similar episode nearly ago. At that time he was found to have an odontoid fracture. He was in a cervical collar for 7 months and the odontoid fracture did not heal. He got a second opinion at [facility] and was told that the fracture might have been pre-existing and he did not require any further treatment. Patient is not expressing any neck  pain today.  CT head and cervical spine was performed. No head injury noted. Patient is awake, alert and oriented. Odontoid fracture still visualized, no new fracture noted. Patient has normal neurologic function.  Lab work was unremarkable. With his previous syncopal episode he had an extensive workup including follow-up with cardiology in outpatient monitoring. I do not feel he requires repeat evaluation at this time. Previous syncope was felt to be combination of vasovagal symptoms and dehydration. Patient is appropriate for discharge.  Facial laceration was sutured. He will have sutures removed by his doctor in 5-7 days. He was given suture care instructions and return precautions.    Orpah Greek, MD 10/31/14 1013

## 2014-10-31 NOTE — ED Notes (Signed)
Discharge instructions and follow up care reviewed with patient. Patient verbalized understanding. 

## 2014-10-31 NOTE — ED Notes (Addendum)
Per EMS patient from home. Patient states he tripped over the edge of the shower getting into the shower this morning. Patient hit head, denied neck pain. Hx of neck trauma. Refused c-collar. Alert, oriented. Recalls the whole event. 3 in laceration on face (forehead to cheek).  CBG 176 BP 156/78 Pulse 78 RR 18 SpO2 96%

## 2014-10-31 NOTE — ED Notes (Signed)
Patient transported to CT 

## 2014-10-31 NOTE — Discharge Instructions (Signed)
Syncope °Syncope is a medical term for fainting or passing out. This means you lose consciousness and drop to the ground. People are generally unconscious for less than 5 minutes. You may have some muscle twitches for up to 15 seconds before waking up and returning to normal. Syncope occurs more often in older adults, but it can happen to anyone. While most causes of syncope are not dangerous, syncope can be a sign of a serious medical problem. It is important to seek medical care.  °CAUSES  °Syncope is caused by a sudden drop in blood flow to the brain. The specific cause is often not determined. Factors that can bring on syncope include: °· Taking medicines that lower blood pressure. °· Sudden changes in posture, such as standing up quickly. °· Taking more medicine than prescribed. °· Standing in one place for too long. °· Seizure disorders. °· Dehydration and excessive exposure to heat. °· Low blood sugar (hypoglycemia). °· Straining to have a bowel movement. °· Heart disease, irregular heartbeat, or other circulatory problems. °· Fear, emotional distress, seeing blood, or severe pain. °SYMPTOMS  °Right before fainting, you may: °· Feel dizzy or light-headed. °· Feel nauseous. °· See all white or all black in your field of vision. °· Have cold, clammy skin. °DIAGNOSIS  °Your health care provider will ask about your symptoms, perform a physical exam, and perform an electrocardiogram (ECG) to record the electrical activity of your heart. Your health care provider may also perform other heart or blood tests to determine the cause of your syncope which may include: °· Transthoracic echocardiogram (TTE). During echocardiography, sound waves are used to evaluate how blood flows through your heart. °· Transesophageal echocardiogram (TEE). °· Cardiac monitoring. This allows your health care provider to monitor your heart rate and rhythm in real time. °· Holter monitor. This is a portable device that records your  heartbeat and can help diagnose heart arrhythmias. It allows your health care provider to track your heart activity for several days, if needed. °· Stress tests by exercise or by giving medicine that makes the heart beat faster. °TREATMENT  °In most cases, no treatment is needed. Depending on the cause of your syncope, your health care provider may recommend changing or stopping some of your medicines. °HOME CARE INSTRUCTIONS °· Have someone stay with you until you feel stable. °· Do not drive, use machinery, or play sports until your health care provider says it is okay. °· Keep all follow-up appointments as directed by your health care provider. °· Lie down right away if you start feeling like you might faint. Breathe deeply and steadily. Wait until all the symptoms have passed. °· Drink enough fluids to keep your urine clear or pale yellow. °· If you are taking blood pressure or heart medicine, get up slowly and take several minutes to sit and then stand. This can reduce dizziness. °SEEK IMMEDIATE MEDICAL CARE IF:  °· You have a severe headache. °· You have unusual pain in the chest, abdomen, or back. °· You are bleeding from your mouth or rectum, or you have black or tarry stool. °· You have an irregular or very fast heartbeat. °· You have pain with breathing. °· You have repeated fainting or seizure-like jerking during an episode. °· You faint when sitting or lying down. °· You have confusion. °· You have trouble walking. °· You have severe weakness. °· You have vision problems. °If you fainted, call your local emergency services (911 in [place] Do not drive   yourself to the hospital.    This information is not intended to replace advice given to you by your health care provider. Make sure you discuss any questions you have with your health care provider.   Document Released: 01/04/2005 Document Revised: 05/21/2014 Document Reviewed: 03/05/2011 Elsevier Interactive Patient Education 2016 Elsevier  [employer]. Facial Laceration  A facial laceration is a cut on the face. These injuries can be painful and cause bleeding. Lacerations usually heal quickly, but they need special care to reduce scarring. DIAGNOSIS  Your health care provider will take a medical history, ask for details about how the injury occurred, and examine the wound to determine how deep the cut is. TREATMENT  Some facial lacerations may not require closure. Others may not be able to be closed because of an increased risk of infection. The risk of infection and the chance for successful closure will depend on various factors, including the amount of time since the injury occurred. The wound may be cleaned to help prevent infection. If closure is appropriate, pain medicines may be given if needed. Your health care provider will use stitches (sutures), wound glue (adhesive), or skin adhesive strips to repair the laceration. These tools bring the skin edges together to allow for faster healing and a better cosmetic outcome. If needed, you may also be given a tetanus shot. HOME CARE INSTRUCTIONS  Only take over-the-counter or prescription medicines as directed by your health care provider.  Follow your health care provider's instructions for wound care. These instructions will vary depending on the technique used for closing the wound. For Sutures:  Keep the wound clean and dry.   If you were given a bandage (dressing), you should change it at least once a day. Also change the dressing if it becomes wet or dirty, or as directed by your health care provider.   Wash the wound with soap and water 2 times a day. Rinse the wound off with water to remove all soap. Pat the wound dry with a clean towel.   After cleaning, apply a thin layer of the antibiotic ointment recommended by your health care provider. This will help prevent infection and keep the dressing from sticking.   You may shower as usual after the first 24 hours. Do not  soak the wound in water until the sutures are removed.   Get your sutures removed as directed by your health care provider. With facial lacerations, sutures should usually be taken out after 4-5 days to avoid stitch marks.   Wait a few days after your sutures are removed before applying any makeup. For Skin Adhesive Strips:  Keep the wound clean and dry.   Do not get the skin adhesive strips wet. You may bathe carefully, using caution to keep the wound dry.   If the wound gets wet, pat it dry with a clean towel.   Skin adhesive strips will fall off on their own. You may trim the strips as the wound heals. Do not remove skin adhesive strips that are still stuck to the wound. They will fall off in time.  For Wound Adhesive:  You may briefly wet your wound in the shower or bath. Do not soak or scrub the wound. Do not swim. Avoid periods of heavy sweating until the skin adhesive has fallen off on its own. After showering or bathing, gently pat the wound dry with a clean towel.   Do not apply liquid medicine, cream medicine, ointment medicine, or makeup to your wound  while the skin adhesive is in place. This may loosen the film before your wound is healed.   If a dressing is placed over the wound, be careful not to apply tape directly over the skin adhesive. This may cause the adhesive to be pulled off before the wound is healed.   Avoid prolonged exposure to sunlight or tanning lamps while the skin adhesive is in place.  The skin adhesive will usually remain in place for 5-10 days, then naturally fall off the skin. Do not pick at the adhesive film.  After Healing: Once the wound has healed, cover the wound with sunscreen during the day for 1 full year. This can help minimize scarring. Exposure to ultraviolet light in the first year will darken the scar. It can take 1-2 years for the scar to lose its redness and to heal completely.  SEEK MEDICAL CARE IF:  You have a fever. SEEK  IMMEDIATE MEDICAL CARE IF:  You have redness, pain, or swelling around the wound.   You see ayellowish-white fluid (pus) coming from the wound.    This information is not intended to replace advice given to you by your health care provider. Make sure you discuss any questions you have with your health care provider.   Document Released: 02/12/2004 Document Revised: 01/25/2014 Document Reviewed: 08/17/2012 Elsevier Interactive Patient Education [employer].

## 2014-10-31 NOTE — ED Notes (Signed)
MD at bedside. 

## 2014-10-31 NOTE — ED Notes (Signed)
Bed: WA03 Expected date:  Expected time:  Means of arrival:  Comments: 66m fall w/laceration

## 2014-11-07 ENCOUNTER — Other Ambulatory Visit (INDEPENDENT_AMBULATORY_CARE_PROVIDER_SITE_OTHER): Payer: Medicare Other

## 2014-11-07 ENCOUNTER — Encounter: Payer: Self-pay | Admitting: Internal Medicine

## 2014-11-07 ENCOUNTER — Ambulatory Visit (INDEPENDENT_AMBULATORY_CARE_PROVIDER_SITE_OTHER): Payer: Medicare Other | Admitting: Internal Medicine

## 2014-11-07 VITALS — BP 134/88 | HR 78 | Temp 98.2°F | Resp 18 | Wt 193.0 lb

## 2014-11-07 DIAGNOSIS — T148XXA Other injury of unspecified body region, initial encounter: Secondary | ICD-10-CM

## 2014-11-07 DIAGNOSIS — R55 Syncope and collapse: Secondary | ICD-10-CM | POA: Diagnosis not present

## 2014-11-07 DIAGNOSIS — T148 Other injury of unspecified body region: Secondary | ICD-10-CM

## 2014-11-07 LAB — CBC WITH DIFFERENTIAL/PLATELET
BASOS PCT: 0.9 % (ref 0.0–3.0)
Basophils Absolute: 0.1 10*3/uL (ref 0.0–0.1)
EOS PCT: 3.6 % (ref 0.0–5.0)
Eosinophils Absolute: 0.3 10*3/uL (ref 0.0–0.7)
HEMATOCRIT: 39.1 % (ref 39.0–52.0)
HEMOGLOBIN: 13 g/dL (ref 13.0–17.0)
LYMPHS PCT: 23.4 % (ref 12.0–46.0)
Lymphs Abs: 2.1 10*3/uL (ref 0.7–4.0)
MCHC: 33.2 g/dL (ref 30.0–36.0)
MCV: 87.9 fl (ref 78.0–100.0)
MONOS PCT: 7.4 % (ref 3.0–12.0)
Monocytes Absolute: 0.7 10*3/uL (ref 0.1–1.0)
NEUTROS ABS: 5.8 10*3/uL (ref 1.4–7.7)
Neutrophils Relative %: 64.7 % (ref 43.0–77.0)
PLATELETS: 188 10*3/uL (ref 150.0–400.0)
RBC: 4.45 Mil/uL (ref 4.22–5.81)
RDW: 13.5 % (ref 11.5–15.5)
WBC: 9 10*3/uL (ref 4.0–10.5)

## 2014-11-07 NOTE — Patient Instructions (Addendum)
Repeat the isometric exercises discussed @ least 5 times prior to standing if you've been seated for a period of time.  Please consider the event Holter monitor to rule out any rapid or irregular beats which could cause loss of consciousness.  Also consider sitting on a teak stool when showering to prevent orthostatic hypotension.  Use warm moist compresses 3- 4 times a day to the bruised areas.

## 2014-11-07 NOTE — Progress Notes (Signed)
   Subjective:    Patient ID: David Howard, male    DOB: 1923-10-17, 81 y.o.   MRN: [id number]  HPI He was seen in the emergency room 10/31/14 after an episode of syncope and collapse. He got out of the shower and was sitting in the bathroom when he became weak and fell forward. He struck his face and left upper extremity. He had an extensive evaluation the emergency room and sutures were placed.  There was no cardiac or neurologic prodrome prior to the event.  His similar episode in April 2015. That time extensive evaluation included an event monitor which was negative.  Review of Systems  Denied were any change in heart rhythm or rate prior to the event. There was no associated chest pain or shortness of breath .  Also specifically denied prior to the episode were headache, limb weakness, tingling, or numbness. No seizure activity noted.  Since the fall he denies any of following: Fever, chills, sweats, or unexplained weight loss not present. No significant headaches. Mental status change or memory loss denied. Blurred vision , diplopia or vision loss absent. Vertigo, near syncope or imbalance denied. There is no numbness, tingling, or weakness in extremities.   No loss of control of bladder or bowels. Radicular type pain absent. No seizure stigmata.    Objective:   Physical Exam Pertinent or positive findings include: He has bilateral ptosis. Bilateral hearing aids are present. There is no hemotympanum on the left. Ecchymosis is present of the left maxillary area. He has extensive bruising over the left upper extremity. There is accentuated curvature of the thoracic spine; the thorax sits on the hips. Pedal pulses are decreased. He walks with a rocking gait and requires support.  General appearance :adequately nourished; in no distress.  Eyes: No conjunctival inflammation or scleral icterus is present.  Oral exam:  Lips and gums are healthy appearing.There is no oropharyngeal  erythema or exudate noted. Dental hygiene is good.  Heart:  Normal rate and regular rhythm. S1 and S2 normal without gallop, murmur, click, rub or other extra sounds    Lungs:Chest clear to auscultation; no wheezes, rhonchi,rales ,or rubs present.No increased work of breathing.   Abdomen: bowel sounds normal, soft and non-tender without masses, organomegaly or hernias noted.  No guarding or rebound.   Vascular : all pulses equal ; no bruits present.  Skin:Warm & dry.  Intact without suspicious lesions or rashes ; no tenting or jaundice   Lymphatic: No lymphadenopathy is noted about the head, neck, axilla, or inguinal areas.   Neuro: Strength, tone decreased.     Assessment & Plan:  #1 syncope and collapse, recurrent. Rule out dysrhythmia etiology. This may be related to standing in the shower and vasodilation related to the water temperature.  #2 extensive bruising  See orders and recommendations

## 2014-11-07 NOTE — Progress Notes (Signed)
Pre visit review using our clinic review tool, if applicable. No additional management support is needed unless otherwise documented below in the visit note. 

## 2014-11-08 DIAGNOSIS — R55 Syncope and collapse: Secondary | ICD-10-CM | POA: Insufficient documentation

## 2014-12-21 ENCOUNTER — Other Ambulatory Visit: Payer: Self-pay | Admitting: Cardiology

## 2014-12-23 NOTE — Telephone Encounter (Signed)
Patient is prn follow up with Dr Marlou Porch. Should this be deferred to pcp? Please advise. Thanks, MI

## 2014-12-24 ENCOUNTER — Other Ambulatory Visit: Payer: Self-pay | Admitting: Cardiology

## 2015-01-01 DIAGNOSIS — M3501 Sicca syndrome with keratoconjunctivitis: Secondary | ICD-10-CM | POA: Diagnosis not present

## 2015-01-01 DIAGNOSIS — H01003 Unspecified blepharitis right eye, unspecified eyelid: Secondary | ICD-10-CM | POA: Diagnosis not present

## 2015-01-01 DIAGNOSIS — H04123 Dry eye syndrome of bilateral lacrimal glands: Secondary | ICD-10-CM | POA: Diagnosis not present

## 2015-01-16 ENCOUNTER — Other Ambulatory Visit: Payer: Self-pay | Admitting: Cardiology

## 2015-01-16 NOTE — Telephone Encounter (Signed)
lisinopril (PRINIVIL,ZESTRIL) 20 MG tablet  Medication   Date: 12/25/2014  Department: [facility]  Ordering/Authorizing: Jerline Pain, MD      Order Providers    Prescribing Provider Encounter Provider   Jerline Pain, MD Jerline Pain, MD    Medication Detail      Disp Refills Start End     lisinopril (PRINIVIL,ZESTRIL) 20 MG tablet 30 tablet 0 12/25/2014     Sig: TAKE (1) TABLET DAILY FOR HIGH BLOOD PRESSURE.    Notes to Pharmacy: Patient should see PCP for further refills.    E-Prescribing Status: Receipt confirmed by pharmacy (12/25/2014 8:35 AM EST)     Pharmacy    [facility]

## 2015-01-17 ENCOUNTER — Other Ambulatory Visit: Payer: Self-pay | Admitting: Internal Medicine

## 2015-01-29 ENCOUNTER — Encounter: Payer: Self-pay | Admitting: Internal Medicine

## 2015-01-29 ENCOUNTER — Ambulatory Visit (INDEPENDENT_AMBULATORY_CARE_PROVIDER_SITE_OTHER): Payer: Medicare Other | Admitting: Internal Medicine

## 2015-01-29 ENCOUNTER — Other Ambulatory Visit (INDEPENDENT_AMBULATORY_CARE_PROVIDER_SITE_OTHER): Payer: Medicare Other

## 2015-01-29 VITALS — BP 158/90 | HR 83 | Temp 97.5°F | Resp 14 | Ht 64.0 in | Wt 191.0 lb

## 2015-01-29 DIAGNOSIS — N400 Enlarged prostate without lower urinary tract symptoms: Secondary | ICD-10-CM | POA: Diagnosis not present

## 2015-01-29 DIAGNOSIS — I10 Essential (primary) hypertension: Secondary | ICD-10-CM | POA: Diagnosis not present

## 2015-01-29 LAB — COMPREHENSIVE METABOLIC PANEL
ALT: 14 U/L (ref 0–53)
AST: 20 U/L (ref 0–37)
Albumin: 4.2 g/dL (ref 3.5–5.2)
Alkaline Phosphatase: 42 U/L (ref 39–117)
BUN: 17 mg/dL (ref 6–23)
CALCIUM: 10.2 mg/dL (ref 8.4–10.5)
CHLORIDE: 100 meq/L (ref 96–112)
CO2: 31 meq/L (ref 19–32)
CREATININE: 1.13 mg/dL (ref 0.40–1.50)
GFR: 64.87 mL/min (ref >60.00)
GLUCOSE: 100 mg/dL — AB (ref 70–99)
Potassium: 4.3 mEq/L (ref 3.5–5.1)
SODIUM: 138 meq/L (ref 135–145)
Total Bilirubin: 1.2 mg/dL (ref 0.2–1.2)
Total Protein: 7.1 g/dL (ref 6.0–8.3)

## 2015-01-29 LAB — CBC
HEMATOCRIT: 43.5 % (ref 39.0–52.0)
Hemoglobin: 14.4 g/dL (ref 13.0–17.0)
MCHC: 33 g/dL (ref 30.0–36.0)
MCV: 88.4 fl (ref 78.0–100.0)
Platelets: 190 10*3/uL (ref 150.0–400.0)
RBC: 4.92 Mil/uL (ref 4.22–5.81)
RDW: 13.6 % (ref 11.5–15.5)
WBC: 9.9 10*3/uL (ref 4.0–10.5)

## 2015-01-29 LAB — HEMOGLOBIN A1C: HEMOGLOBIN A1C: 5.1 % (ref 4.6–6.5)

## 2015-01-29 LAB — TSH: TSH: 1.66 u[IU]/mL (ref 0.35–4.50)

## 2015-01-29 NOTE — Patient Instructions (Signed)
We will check the labs today and call you back with the results.  It is okay to take the flonase everyday.

## 2015-01-29 NOTE — Progress Notes (Signed)
Pre visit review using our clinic review tool, if applicable. No additional management support is needed unless otherwise documented below in the visit note. 

## 2015-01-31 NOTE — Progress Notes (Signed)
   Subjective:    Patient ID: David Howard, male    DOB: December 06, 1923, 82 y.o.   MRN: [id number]  HPI The patient is an 82 YO man coming in for follow up of his blood pressure. He is still taking his medicines although did not yet take today. He denies headaches, chest pains, SOB, nausea or vomiting. No side effects from his medicines. Having more BPH symptoms and seeing the urologist in the next month.   Review of Systems  Constitutional: Negative for fever, activity change, appetite change, fatigue and unexpected weight change.  HENT: Negative.   Respiratory: Negative for cough, chest tightness, shortness of breath and wheezing.   Cardiovascular: Negative for chest pain, palpitations and leg swelling.  Gastrointestinal: Negative for nausea, abdominal pain, diarrhea, constipation and abdominal distention.  Musculoskeletal: Negative.   Skin: Negative.   Neurological: Negative.   Psychiatric/Behavioral: Negative.       Objective:   Physical Exam  Constitutional: He is oriented to person, place, and time. He appears well-developed and well-nourished.  HENT:  Head: Normocephalic and atraumatic.  Eyes: EOM are normal.  Neck: Normal range of motion.  Cardiovascular: Normal rate and regular rhythm.   Pulmonary/Chest: Effort normal and breath sounds normal.  Abdominal: Soft. He exhibits no distension. There is no tenderness. There is no rebound.  Musculoskeletal: He exhibits no edema.  Neurological: He is alert and oriented to person, place, and time. Coordination abnormal.  Slow to rise, some hesitating gait.  Skin: Skin is warm and dry.   Filed Vitals:   01/29/15 0959  BP: 158/90  Pulse: 83  Temp: 97.5 F (36.4 C)  TempSrc: Oral  Resp: 14  Height: 5\' 4"  (1.626 m)  Weight: 191 lb (86.637 kg)  SpO2: 97%      Assessment & Plan:

## 2015-01-31 NOTE — Assessment & Plan Note (Signed)
He is taking lisinopril 20 mg and cardura for the blood pressure. BP slightly elevated today and not normally so. Will monitor and if still elevated will change dose of lisinopril. Checking labs today and adjust as needed.

## 2015-01-31 NOTE — Assessment & Plan Note (Signed)
Still seeing urology and taking cardura which is not helping as much anymore. He will talk to them about changing. They monitor his PSA.

## 2015-03-20 ENCOUNTER — Ambulatory Visit (INDEPENDENT_AMBULATORY_CARE_PROVIDER_SITE_OTHER): Payer: Medicare Other | Admitting: General Practice

## 2015-03-20 DIAGNOSIS — Z23 Encounter for immunization: Secondary | ICD-10-CM | POA: Diagnosis not present

## 2015-03-20 DIAGNOSIS — Z418 Encounter for other procedures for purposes other than remedying health state: Secondary | ICD-10-CM | POA: Diagnosis not present

## 2015-03-20 DIAGNOSIS — Z299 Encounter for prophylactic measures, unspecified: Secondary | ICD-10-CM

## 2015-03-28 DIAGNOSIS — Z0000 Encounter for general adult medical examination without abnormal findings: Secondary | ICD-10-CM | POA: Diagnosis not present

## 2015-03-28 DIAGNOSIS — R3916 Straining to void: Secondary | ICD-10-CM | POA: Diagnosis not present

## 2015-03-28 DIAGNOSIS — R972 Elevated prostate specific antigen [PSA]: Secondary | ICD-10-CM | POA: Diagnosis not present

## 2015-03-28 DIAGNOSIS — N401 Enlarged prostate with lower urinary tract symptoms: Secondary | ICD-10-CM | POA: Diagnosis not present

## 2015-04-11 DIAGNOSIS — D692 Other nonthrombocytopenic purpura: Secondary | ICD-10-CM | POA: Diagnosis not present

## 2015-04-11 DIAGNOSIS — L720 Epidermal cyst: Secondary | ICD-10-CM | POA: Diagnosis not present

## 2015-04-11 DIAGNOSIS — L821 Other seborrheic keratosis: Secondary | ICD-10-CM | POA: Diagnosis not present

## 2015-04-11 DIAGNOSIS — D1801 Hemangioma of skin and subcutaneous tissue: Secondary | ICD-10-CM | POA: Diagnosis not present

## 2015-04-11 DIAGNOSIS — L812 Freckles: Secondary | ICD-10-CM | POA: Diagnosis not present

## 2015-04-19 ENCOUNTER — Other Ambulatory Visit: Payer: Self-pay | Admitting: Internal Medicine

## 2015-05-20 ENCOUNTER — Other Ambulatory Visit: Payer: Self-pay | Admitting: Internal Medicine

## 2015-05-27 DIAGNOSIS — H903 Sensorineural hearing loss, bilateral: Secondary | ICD-10-CM | POA: Diagnosis not present

## 2015-05-27 DIAGNOSIS — H6123 Impacted cerumen, bilateral: Secondary | ICD-10-CM | POA: Diagnosis not present

## 2015-06-18 ENCOUNTER — Telehealth: Payer: Self-pay | Admitting: Internal Medicine

## 2015-06-18 ENCOUNTER — Other Ambulatory Visit: Payer: Self-pay | Admitting: Internal Medicine

## 2015-06-18 NOTE — Telephone Encounter (Signed)
Patient wants to know if it is ok for him to take and Advil PM, or a Tylenol PM PRN. Please advise in Dr. Charlynne Cousins absence, thanks.

## 2015-06-18 NOTE — Telephone Encounter (Signed)
Patient aware.

## 2015-06-18 NOTE — Telephone Encounter (Signed)
Yes, that is fine. 

## 2015-06-18 NOTE — Telephone Encounter (Signed)
Patient would like to know if he can take sleeping pills from time to time.

## 2015-07-17 ENCOUNTER — Ambulatory Visit (HOSPITAL_BASED_OUTPATIENT_CLINIC_OR_DEPARTMENT_OTHER): Payer: Medicare Other | Admitting: Oncology

## 2015-07-17 VITALS — BP 141/65 | HR 88 | Temp 97.6°F | Resp 16 | Ht 64.0 in | Wt 189.2 lb

## 2015-07-17 DIAGNOSIS — Z8572 Personal history of non-Hodgkin lymphomas: Secondary | ICD-10-CM | POA: Diagnosis not present

## 2015-07-17 DIAGNOSIS — C8590 Non-Hodgkin lymphoma, unspecified, unspecified site: Secondary | ICD-10-CM

## 2015-07-17 NOTE — Progress Notes (Signed)
  David Howard OFFICE PROGRESS NOTE   Diagnosis: Non-Hodgkin's lymphoma  INTERVAL HISTORY:   David Howard returns as scheduled. He feels well. No specific complaint. He recently saw his dermatologist.  Objective:  Vital signs in last 24 hours:  Blood pressure 141/65, pulse 88, temperature 97.6 F (36.4 C), temperature source Oral, resp. rate 16, height 5\' 4"  (1.626 m), weight 189 lb 3.2 oz (85.821 kg), SpO2 100 %.    HEENT: Neck without mass Lymphatics: No cervical, supra-clavicular, axillary, or inguinal nodes Resp: Lungs with fine rales at the lower posterior chest bilaterally, no respiratory distress Cardio: Regular rate and rhythm GI: No hepatosplenomegaly Vascular: No leg edema  Skin: 3-4 mm dark mole at the mid upper back, no area for current tumor at the site of mid back radiation. No nodular skin lesions appreciated.   Medications: I have reviewed the patient's current medications.  Assessment/Plan: 1. Primary cutaneous B-cell lymphoma, status post primary radiation in 2003. No clinical evidence for recurrent disease. 2. Chronic prominence at the right medial scapula, likely benign asymmetry of the back musculature. 3. History of benign prostatic hypertrophy and superficial bladder cancer, followed by David Howard. 4. History of a right inguinal hernia, status post repair by David Howard.   Disposition:  David Howard remains in clinical remission from non-Hodgkin's lymphoma. He would like to continue follow-up at the [facility]. He will return for an office visit in one year. I recommended he ask David Howard to evaluate the dark mole when he sees her next month.  Betsy Coder, MD  07/17/2015  12:18 PM

## 2015-07-18 ENCOUNTER — Telehealth: Payer: Self-pay | Admitting: Oncology

## 2015-07-18 NOTE — Telephone Encounter (Signed)
S.w. Pt and advised on June 2018

## 2015-08-06 ENCOUNTER — Encounter: Payer: Self-pay | Admitting: Internal Medicine

## 2015-08-06 ENCOUNTER — Ambulatory Visit (INDEPENDENT_AMBULATORY_CARE_PROVIDER_SITE_OTHER): Payer: Medicare Other | Admitting: Internal Medicine

## 2015-08-06 ENCOUNTER — Other Ambulatory Visit (INDEPENDENT_AMBULATORY_CARE_PROVIDER_SITE_OTHER): Payer: Medicare Other

## 2015-08-06 VITALS — BP 130/84 | HR 73 | Temp 98.5°F | Resp 14 | Ht 68.0 in | Wt 189.0 lb

## 2015-08-06 DIAGNOSIS — Z0000 Encounter for general adult medical examination without abnormal findings: Secondary | ICD-10-CM

## 2015-08-06 DIAGNOSIS — I10 Essential (primary) hypertension: Secondary | ICD-10-CM

## 2015-08-06 LAB — COMPREHENSIVE METABOLIC PANEL
ALBUMIN: 4.2 g/dL (ref 3.5–5.2)
ALT: 15 U/L (ref 0–53)
AST: 20 U/L (ref 0–37)
Alkaline Phosphatase: 40 U/L (ref 39–117)
BILIRUBIN TOTAL: 1.4 mg/dL — AB (ref 0.2–1.2)
BUN: 15 mg/dL (ref 6–23)
CALCIUM: 9.7 mg/dL (ref 8.4–10.5)
CO2: 27 meq/L (ref 19–32)
Chloride: 98 mEq/L (ref 96–112)
Creatinine, Ser: 1.04 mg/dL (ref 0.40–1.50)
GFR: 71.31 mL/min (ref >60.00)
Glucose, Bld: 103 mg/dL — ABNORMAL HIGH (ref 70–99)
Potassium: 4 mEq/L (ref 3.5–5.1)
Sodium: 136 mEq/L (ref 135–145)
Total Protein: 7 g/dL (ref 6.0–8.3)

## 2015-08-06 LAB — LIPID PANEL
CHOL/HDL RATIO: 3
Cholesterol: 195 mg/dL (ref 0–200)
HDL: 75.4 mg/dL (ref >39.00)
LDL CALC: 104 mg/dL — AB (ref 0–99)
NonHDL: 119.8
TRIGLYCERIDES: 77 mg/dL (ref 0.0–149.0)
VLDL: 15.4 mg/dL (ref 0.0–40.0)

## 2015-08-06 LAB — PSA: PSA: 2.3 ng/mL (ref 0.10–4.00)

## 2015-08-06 NOTE — Progress Notes (Signed)
   Subjective:    Patient ID: David Howard, male    DOB: 1923-12-29, 82 y.o.   MRN: [id number]  HPI Here for medicare wellness, no new complaints. Please see A/P for status and treatment of chronic medical problems.   Diet: heart healthy Physical activity: sedentary Depression/mood screen: negative Hearing: intact to whispered voice with bilateral aids Visual acuity: grossly normal, performs annual eye exam  ADLs: capable Fall risk: none Home safety: good Cognitive evaluation: intact to orientation, naming, recall and repetition EOL planning: adv directives discussed, in place  I have personally reviewed and have noted 1. The patient's medical and social history - reviewed today no changes 2. Their use of alcohol, tobacco or illicit drugs 3. Their current medications and supplements 4. The patient's functional ability including ADL's, fall risks, home safety risks and hearing or visual impairment. 5. Diet and physical activities 6. Evidence for depression or mood disorders 7. Care team reviewed and updated (available in snapshot)  Review of Systems  Constitutional: Negative for fever, activity change, appetite change, fatigue and unexpected weight change.  HENT: Negative.   Eyes: Negative.   Respiratory: Negative for cough, chest tightness, shortness of breath and wheezing.   Cardiovascular: Negative for chest pain, palpitations and leg swelling.  Gastrointestinal: Negative for nausea, abdominal pain, diarrhea, constipation and abdominal distention.  Musculoskeletal: Negative.   Skin: Negative.   Neurological: Negative.   Psychiatric/Behavioral: Negative.       Objective:   Physical Exam  Constitutional: He is oriented to person, place, and time. He appears well-developed and well-nourished.  HENT:  Head: Normocephalic and atraumatic.  Eyes: EOM are normal.  Neck: Normal range of motion.  Cardiovascular: Normal rate and regular rhythm.   Pulmonary/Chest: Effort normal  and breath sounds normal.  Abdominal: Soft. He exhibits no distension. There is no tenderness. There is no rebound.  Musculoskeletal: He exhibits no edema.  Neurological: He is alert and oriented to person, place, and time. Coordination abnormal.  Slow to rise, some hesitating gait.  Skin: Skin is warm and dry.   Filed Vitals:   08/06/15 0929  BP: 130/84  Pulse: 73  Temp: 98.5 F (36.9 C)  TempSrc: Oral  Resp: 14  Height: 5\' 8"  (1.727 m)  Weight: 189 lb (85.73 kg)  SpO2: 98%      Assessment & Plan:

## 2015-08-06 NOTE — Progress Notes (Signed)
Pre visit review using our clinic review tool, if applicable. No additional management support is needed unless otherwise documented below in the visit note. 

## 2015-08-06 NOTE — Patient Instructions (Signed)
We will check the labs today and call you back with the results.   Health Maintenance, Male A healthy lifestyle and preventative care can promote health and wellness.  Maintain regular health, dental, and eye exams.  Eat a healthy diet. Foods like vegetables, fruits, whole grains, low-fat dairy products, and lean protein foods contain the nutrients you need and are low in calories. Decrease your intake of foods high in solid fats, added sugars, and salt. Get information about a proper diet from your health care provider, if necessary.  Regular physical exercise is one of the most important things you can do for your health. Most adults should get at least 150 minutes of moderate-intensity exercise (any activity that increases your heart rate and causes you to sweat) each week. In addition, most adults need muscle-strengthening exercises on 2 or more days a week.   Maintain a healthy weight. The body mass index (BMI) is a screening tool to identify possible weight problems. It provides an estimate of body fat based on height and weight. Your health care provider can find your BMI and can help you achieve or maintain a healthy weight. For males 20 years and older:  A BMI below 18.5 is considered underweight.  A BMI of 18.5 to 24.9 is normal.  A BMI of 25 to 29.9 is considered overweight.  A BMI of 30 and above is considered obese.  Maintain normal blood lipids and cholesterol by exercising and minimizing your intake of saturated fat. Eat a balanced diet with plenty of fruits and vegetables. Blood tests for lipids and cholesterol should begin at age 20 and be repeated every 5 years. If your lipid or cholesterol levels are high, you are over age 50, or you are at high risk for heart disease, you may need your cholesterol levels checked more frequently.Ongoing high lipid and cholesterol levels should be treated with medicines if diet and exercise are not working.  If you smoke, find out from your  health care provider how to quit. If you do not use tobacco, do not start.  Lung cancer screening is recommended for adults aged 55-80 years who are at high risk for developing lung cancer because of a history of smoking. A yearly low-dose CT scan of the lungs is recommended for people who have at least a 30-pack-year history of smoking and are current smokers or have quit within the past 15 years. A pack year of smoking is smoking an average of 1 pack of cigarettes a day for 1 year (for example, a 30-pack-year history of smoking could mean smoking 1 pack a day for 30 years or 2 packs a day for 15 years). Yearly screening should continue until the smoker has stopped smoking for at least 15 years. Yearly screening should be stopped for people who develop a health problem that would prevent them from having lung cancer treatment.  If you choose to drink alcohol, do not have more than 2 drinks per day. One drink is considered to be 12 oz (360 mL) of beer, 5 oz (150 mL) of wine, or 1.5 oz (45 mL) of liquor.  Avoid the use of street drugs. Do not share needles with anyone. Ask for help if you need support or instructions about stopping the use of drugs.  High blood pressure causes heart disease and increases the risk of stroke. High blood pressure is more likely to develop in:  People who have blood pressure in the end of the normal range (100-139/85-89 mm   Hg).  People who are overweight or obese.  People who are African American.  If you are 18-39 years of age, have your blood pressure checked every 3-5 years. If you are 40 years of age or older, have your blood pressure checked every year. You should have your blood pressure measured twice--once when you are at a hospital or clinic, and once when you are not at a hospital or clinic. Record the average of the two measurements. To check your blood pressure when you are not at a hospital or clinic, you can use:  An automated blood pressure machine at a  pharmacy.  A home blood pressure monitor.  If you are 45-79 years old, ask your health care provider if you should take aspirin to prevent heart disease.  Diabetes screening involves taking a blood sample to check your fasting blood sugar level. This should be done once every 3 years after age 45 if you are at a normal weight and without risk factors for diabetes. Testing should be considered at a younger age or be carried out more frequently if you are overweight and have at least 1 risk factor for diabetes.  Colorectal cancer can be detected and often prevented. Most routine colorectal cancer screening begins at the age of 50 and continues through age 75. However, your health care provider may recommend screening at an earlier age if you have risk factors for colon cancer. On a yearly basis, your health care provider may provide home test kits to check for hidden blood in the stool. A small camera at the end of a tube may be used to directly examine the colon (sigmoidoscopy or colonoscopy) to detect the earliest forms of colorectal cancer. Talk to your health care provider about this at age 50 when routine screening begins. A direct exam of the colon should be repeated every 5-10 years through age 75, unless early forms of precancerous polyps or small growths are found.  People who are at an increased risk for hepatitis B should be screened for this virus. You are considered at high risk for hepatitis B if:  You were born in a country where hepatitis B occurs often. Talk with your health care provider about which countries are considered high risk.  Your parents were born in a high-risk country and you have not received a shot to protect against hepatitis B (hepatitis B vaccine).  You have HIV or AIDS.  You use needles to inject street drugs.  You live with, or have sex with, someone who has hepatitis B.  You are a man who has sex with other men (MSM).  You get hemodialysis  treatment.  You take certain medicines for conditions like cancer, organ transplantation, and autoimmune conditions.  Hepatitis C blood testing is recommended for all people born from 1945 through 1965 and any individual with known risk factors for hepatitis C.  Healthy men should no longer receive prostate-specific antigen (PSA) blood tests as part of routine cancer screening. Talk to your health care provider about prostate cancer screening.  Testicular cancer screening is not recommended for adolescents or adult males who have no symptoms. Screening includes self-exam, a health care provider exam, and other screening tests. Consult with your health care provider about any symptoms you have or any concerns you have about testicular cancer.  Practice safe sex. Use condoms and avoid high-risk sexual practices to reduce the spread of sexually transmitted infections (STIs).  You should be screened for STIs, including gonorrhea and   chlamydia if:  You are sexually active and are younger than 24 years.  You are older than 24 years, and your health care provider tells you that you are at risk for this type of infection.  Your sexual activity has changed since you were last screened, and you are at an increased risk for chlamydia or gonorrhea. Ask your health care provider if you are at risk.  If you are at risk of being infected with HIV, it is recommended that you take a prescription medicine daily to prevent HIV infection. This is called pre-exposure prophylaxis (PrEP). You are considered at risk if:  You are a man who has sex with other men (MSM).  You are a heterosexual man who is sexually active with multiple partners.  You take drugs by injection.  You are sexually active with a partner who has HIV.  Talk with your health care provider about whether you are at high risk of being infected with HIV. If you choose to begin PrEP, you should first be tested for HIV. You should then be tested  every 3 months for as long as you are taking PrEP.  Use sunscreen. Apply sunscreen liberally and repeatedly throughout the day. You should seek shade when your shadow is shorter than you. Protect yourself by wearing long sleeves, pants, a wide-brimmed hat, and sunglasses year round whenever you are outdoors.  Tell your health care provider of new moles or changes in moles, especially if there is a change in shape or color. Also, tell your health care provider if a mole is larger than the size of a pencil eraser.  A one-time screening for abdominal aortic aneurysm (AAA) and surgical repair of large AAAs by ultrasound is recommended for men aged 65-75 years who are current or former smokers.  Stay current with your vaccines (immunizations).   This information is not intended to replace advice given to you by your health care provider. Make sure you discuss any questions you have with your health care provider.   Document Released: 07/03/2007 Document Revised: 01/25/2014 Document Reviewed: 06/01/2010 Elsevier Interactive Patient Education 2016 [employer] Inc.  

## 2015-08-07 NOTE — Assessment & Plan Note (Signed)
BP at goal on doxazosin, finasteride, lisinopril. Checking CMP and adjust as needed.

## 2015-08-07 NOTE — Assessment & Plan Note (Signed)
Aged out of colonoscopy. Counseled on sun safety and mole surveillance. Checking labs, adjust as needed. Given 10 year screening recommendations.

## 2015-08-19 ENCOUNTER — Other Ambulatory Visit: Payer: Self-pay | Admitting: Internal Medicine

## 2015-09-16 DIAGNOSIS — H04123 Dry eye syndrome of bilateral lacrimal glands: Secondary | ICD-10-CM | POA: Diagnosis not present

## 2015-09-16 DIAGNOSIS — H353110 Nonexudative age-related macular degeneration, right eye, stage unspecified: Secondary | ICD-10-CM | POA: Diagnosis not present

## 2015-09-16 DIAGNOSIS — H353120 Nonexudative age-related macular degeneration, left eye, stage unspecified: Secondary | ICD-10-CM | POA: Diagnosis not present

## 2015-09-16 DIAGNOSIS — H40013 Open angle with borderline findings, low risk, bilateral: Secondary | ICD-10-CM | POA: Diagnosis not present

## 2015-09-16 LAB — HM DIABETES EYE EXAM

## 2015-09-19 ENCOUNTER — Other Ambulatory Visit: Payer: Self-pay | Admitting: Internal Medicine

## 2015-09-26 ENCOUNTER — Encounter: Payer: Self-pay | Admitting: Internal Medicine

## 2015-10-16 ENCOUNTER — Other Ambulatory Visit: Payer: Self-pay | Admitting: Internal Medicine

## 2015-10-21 ENCOUNTER — Ambulatory Visit (INDEPENDENT_AMBULATORY_CARE_PROVIDER_SITE_OTHER): Payer: Medicare Other

## 2015-10-21 DIAGNOSIS — Z23 Encounter for immunization: Secondary | ICD-10-CM

## 2015-11-20 ENCOUNTER — Other Ambulatory Visit: Payer: Self-pay | Admitting: Internal Medicine

## 2015-12-02 DIAGNOSIS — H6123 Impacted cerumen, bilateral: Secondary | ICD-10-CM | POA: Diagnosis not present

## 2015-12-02 DIAGNOSIS — H903 Sensorineural hearing loss, bilateral: Secondary | ICD-10-CM | POA: Diagnosis not present

## 2015-12-04 DIAGNOSIS — L821 Other seborrheic keratosis: Secondary | ICD-10-CM | POA: Diagnosis not present

## 2015-12-20 ENCOUNTER — Other Ambulatory Visit: Payer: Self-pay | Admitting: Internal Medicine

## 2015-12-26 ENCOUNTER — Telehealth: Payer: Self-pay | Admitting: Internal Medicine

## 2015-12-26 NOTE — Telephone Encounter (Signed)
PLEASE NOTE: All timestamps contained within this report are represented as [place] Standard Time. CONFIDENTIALTY NOTICE: This fax transmission is intended only for the addressee. It contains information that is legally privileged, confidential or otherwise protected from use or disclosure. If you are not the intended recipient, you are strictly prohibited from reviewing, disclosing, copying using or disseminating any of this information or taking any action in reliance on or regarding this information. If you have received this fax in error, please notify us immediately by telephone so that we can arrange for its return to [place]. Phone: [phone], Toll-Free: [phone], Fax: [phone] Page: 1 of 1 Call Id: [id number] [facility] - Client [place] Patient Name: David Howard DOB: 03-06-1923 Initial Comment Caller states husband voice started going away a couple days ago, has a bit of a cough Nurse Assessment Nurse: Dimas Chyle, RN, Dellis Filbert Date/Time Eilene Ghazi Time): 12/26/2015 10:26:24 AM Confirm and document reason for call. If symptomatic, describe symptoms. ---Caller states husband voice started going away a couple days ago, has a bit of a cough. No fever. Does the patient have any new or worsening symptoms? ---Yes Will a triage be completed? ---Yes Related visit to physician within the last 2 weeks? ---No Does the PT have any chronic conditions? (i.e. diabetes, asthma, etc.) ---Yes List chronic conditions. ---HTN Is this a behavioral health or substance abuse call? ---No Guidelines Guideline Title Affirmed Question Affirmed Notes Hoarseness Mild hoarseness (all triage questions negative) Final Disposition User [place], RN, Dellis Filbert Disagree/Comply: Comply

## 2016-01-03 ENCOUNTER — Ambulatory Visit (INDEPENDENT_AMBULATORY_CARE_PROVIDER_SITE_OTHER): Payer: Medicare Other | Admitting: Family Medicine

## 2016-01-03 ENCOUNTER — Encounter: Payer: Self-pay | Admitting: Family Medicine

## 2016-01-03 DIAGNOSIS — R059 Cough, unspecified: Secondary | ICD-10-CM

## 2016-01-03 DIAGNOSIS — R05 Cough: Secondary | ICD-10-CM | POA: Diagnosis not present

## 2016-01-03 MED ORDER — HYDROCOD POLST-CPM POLST ER 10-8 MG/5ML PO SUER: 5.0000 mL | Freq: Two times a day (BID) | ORAL | 0 refills | Status: DC | PRN

## 2016-01-03 NOTE — Progress Notes (Signed)
   Subjective:  Patient ID: David Howard, male    DOB: 11-11-1923  Age: 82 y.o. MRN: [id number]  CC: Cough  HPI:  82 year old male presents with complaints of cough.  Cough  X 10 days.  Nonproductive.  No associated fever, chills, SOB.  No medications or interventions tried.  Worse at night.  No known relieving factors.  Moderate in severity.  No other complaints at this time.  Social Hx   Social History   Social History  . Marital status: Married    Spouse name: zelda Avina  . Number of children: N/A  . Years of education: N/A   Occupational History  . retired   . 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 Smoking status: Former Smoker    Types: Cigarettes  . Smokeless tobacco: Never Used  . Alcohol use Yes     Comment: social use  . Drug use: No  . Sexual activity: Not Asked   Other Topics Concern  . None   Social History Narrative  . None    Review of Systems  Constitutional: Negative.   Respiratory: Positive for cough. Negative for shortness of breath.    Objective:  BP 120/80   Pulse 88   Temp 97.7 F (36.5 C) (Oral)   Resp 17   Ht 5\' 8"  (1.727 m)   Wt 188 lb (85.3 kg)   SpO2 97%   BMI 28.59 kg/m   BP/Weight 01/03/2016 08/06/2015 [id number]  Systolic BP [id number] [id number]  Diastolic BP 80 84 65  Wt. (Lbs) 188 189 189.2  BMI 28.59 28.74 32.46   Physical Exam  Constitutional: He appears well-developed. No distress.  HENT:  Mouth/Throat: Oropharynx is clear and moist.  Cardiovascular: Normal rate and regular rhythm.   Pulmonary/Chest: Effort normal and breath sounds normal.  Neurological: He is alert.  Psychiatric: He has a normal mood and affect.  Vitals reviewed.  Lab Results  Component Value Date   WBC 9.9 01/29/2015   HGB 14.4 01/29/2015   HCT 43.5 01/29/2015   PLT 190.0 01/29/2015   GLUCOSE 103 (H) 08/06/2015   CHOL 195 08/06/2015   TRIG 77.0 08/06/2015   HDL 75.40 08/06/2015   LDLCALC 104 (H)  08/06/2015   ALT 15 08/06/2015   AST 20 08/06/2015   NA 136 08/06/2015   K 4.0 08/06/2015   CL 98 08/06/2015   CREATININE 1.04 08/06/2015   BUN 15 08/06/2015   CO2 27 08/06/2015   TSH 1.66 01/29/2015   PSA 2.30 08/06/2015   HGBA1C 5.1 01/29/2015    Assessment & Plan:   Problem List Items Addressed This Visit    Cough    No acute problem. Likely viral. Treating with tussionex. No indication for antibiotics.        Meds ordered this encounter  Medications  . chlorpheniramine-HYDROcodone (TUSSIONEX PENNKINETIC ER) 10-8 MG/5ML SUER    Sig: Take 5 mLs by mouth every 12 (twelve) hours as needed.    Dispense:  115 mL    Refill:  0    Follow-up: PRN  [place]

## 2016-01-03 NOTE — Progress Notes (Signed)
Pre-visit discussion using our clinic review tool. No additional management support is needed unless otherwise documented below in the visit note.  

## 2016-01-03 NOTE — Assessment & Plan Note (Signed)
No acute problem. Likely viral. Treating with tussionex. No indication for antibiotics.

## 2016-01-03 NOTE — Patient Instructions (Signed)
This is likely viral.  Use the cough medication particularly at night.  Follow-up as needed.  Take care  Dr. Lacinda Axon

## 2016-01-15 ENCOUNTER — Other Ambulatory Visit: Payer: Self-pay | Admitting: Internal Medicine

## 2016-02-10 ENCOUNTER — Ambulatory Visit: Payer: Medicare Other | Admitting: Internal Medicine

## 2016-02-16 ENCOUNTER — Ambulatory Visit: Payer: Medicare Other | Admitting: Family Medicine

## 2016-02-17 ENCOUNTER — Other Ambulatory Visit: Payer: Self-pay | Admitting: Internal Medicine

## 2016-03-16 ENCOUNTER — Encounter: Payer: Self-pay | Admitting: Family Medicine

## 2016-03-16 ENCOUNTER — Ambulatory Visit (INDEPENDENT_AMBULATORY_CARE_PROVIDER_SITE_OTHER): Payer: Medicare Other | Admitting: Family Medicine

## 2016-03-16 VITALS — BP 138/80 | HR 99 | Ht 68.0 in | Wt 189.3 lb

## 2016-03-16 DIAGNOSIS — I10 Essential (primary) hypertension: Secondary | ICD-10-CM | POA: Diagnosis not present

## 2016-03-16 DIAGNOSIS — N400 Enlarged prostate without lower urinary tract symptoms: Secondary | ICD-10-CM

## 2016-03-16 DIAGNOSIS — Z9181 History of falling: Secondary | ICD-10-CM | POA: Diagnosis not present

## 2016-03-16 NOTE — Patient Instructions (Signed)
I will put in referral for physical therapy Let's plan on 3 month follow up.

## 2016-03-16 NOTE — Progress Notes (Signed)
Pre visit review using our clinic review tool, if applicable. No additional management support is needed unless otherwise documented below in the visit note. 

## 2016-03-16 NOTE — Progress Notes (Signed)
Subjective:     Patient ID: David Howard, male   DOB: 12/31/1923, 83 y.o.   MRN: [id number]  HPI Patient seen to establish care. He has history of hypertension, BPH, history of bladder cancer, spinal stenosis, history of non-Hodgkin's lymphoma, and reported elevated PSA.  Medications include baby aspirin 81 mg daily, Cardura 2 mg daily at bedtime, Proscar 5 mgs daily at bedtime, and lisinopril 20 mg daily  Wife's major concern is that she states he frequently seems "off balance". He frequently has to hold on to her to walk. He has had a couple falls in the past year but not in recent weeks. He does not use any device for assistance such as cane or walker He has not had any recent physical therapy. He states his urine flow is good but he has frequent nocturia.  No orthostatic symptoms with Cardura.  Wife states he is very sedentary and does not walk much.  Past Medical History:  Diagnosis Date  . Benign neoplasm of colon   . Diverticulosis of colon (without mention of hemorrhage)   . Elevated prostate specific antigen (PSA)    BPH, follows with uro  . Esophageal reflux   . Hearing loss    L>R, follows with ENT, wears hearing aides  . Inguinal hernia without mention of obstruction or gangrene, unilateral or unspecified, (not specified as recurrent)   . Internal hemorrhoids without mention of complication   . Malignant neoplasm of bladder, part unspecified   . Other abnormal glucose   . Peripheral vascular disease, unspecified   . Personal history of other lymphatic and hematopoietic neoplasm 2003   Primary cutaneous B-cell lymphoma, s/p xrt  . Spinal stenosis, unspecified region other than cervical    R>L LE pain  . Unspecified essential hypertension    Past Surgical History:  Procedure Laterality Date  . CATARACT EXTRACTION    . LAPAROSCOPY    . PILONIDAL CYST / SINUS EXCISION    . TONSILLECTOMY      reports that he has quit smoking. His smoking use included Cigarettes. He has  never used smokeless tobacco. He reports that he drinks alcohol. He reports that he does not use drugs. family history includes Kidney disease in his father. No Known Allergies    Review of Systems  Constitutional: Negative for fatigue and unexpected weight change.  Eyes: Negative for visual disturbance.  Respiratory: Negative for cough, chest tightness and shortness of breath.   Cardiovascular: Negative for chest pain, palpitations and leg swelling.  Gastrointestinal: Negative for abdominal pain.  Endocrine: Negative for polydipsia and polyuria.  Genitourinary: Negative for dysuria.  Musculoskeletal: Positive for back pain.  Neurological: Negative for dizziness, syncope, weakness, light-headedness and headaches.       Objective:   Physical Exam  Constitutional: He is oriented to person, place, and time. He appears well-developed and well-nourished.  Neck: Neck supple. No thyromegaly present.  Cardiovascular: Normal rate and regular rhythm.   Pulmonary/Chest: Effort normal and breath sounds normal. No respiratory distress. He has no wheezes. He has no rales.  Musculoskeletal: He exhibits no edema.  Neurological: He is alert and oriented to person, place, and time.  Patient was able to transfer from chair to exam table but seemed somewhat unsteady on his feet when he first got up       Assessment:     #1 hypertension stable and at goal  #2 history of BPH symptomatically stable  #3 history of bladder cancer  #4 past history of  non-Hodgkin's lymphoma  #5 history of spinal stenosis  #6 high fall risk    Plan:     -Continue with current medications -Caution about orthostatic risk with Cardura -Recommend consideration for outpatient physical therapy for balance training and to evaluate for fall risk -We'll plan routine follow-up in 3 months  [name] MD [facility]

## 2016-03-17 ENCOUNTER — Other Ambulatory Visit: Payer: Self-pay

## 2016-03-17 MED ORDER — DOXAZOSIN MESYLATE 2 MG PO TABS: 2.0000 mg | ORAL_TABLET | Freq: Every day | ORAL | 2 refills | Status: DC

## 2016-03-18 ENCOUNTER — Other Ambulatory Visit: Payer: Self-pay | Admitting: Family Medicine

## 2016-03-23 ENCOUNTER — Ambulatory Visit: Payer: Medicare Other | Attending: Family Medicine | Admitting: Physical Therapy

## 2016-03-23 ENCOUNTER — Encounter: Payer: Self-pay | Admitting: Physical Therapy

## 2016-03-23 DIAGNOSIS — R262 Difficulty in walking, not elsewhere classified: Secondary | ICD-10-CM | POA: Diagnosis not present

## 2016-03-23 DIAGNOSIS — M6281 Muscle weakness (generalized): Secondary | ICD-10-CM | POA: Diagnosis not present

## 2016-03-23 DIAGNOSIS — R293 Abnormal posture: Secondary | ICD-10-CM | POA: Diagnosis not present

## 2016-03-23 DIAGNOSIS — R296 Repeated falls: Secondary | ICD-10-CM

## 2016-03-23 NOTE — Therapy (Signed)
[facility] [address], [place], [place], [address] Phone: [phone]   Fax:  [phone]  Physical Therapy Evaluation  Patient Details  Name: David Howard MRN: [id number] Date of Birth: May 29, 1923 Referring Provider: Carolann Littler, MD  Encounter Date: 03/23/2016      PT End of Session - 03/23/16 1739    Visit Number 1   Date for PT Re-Evaluation 05/04/16   Authorization Type Gcodes needed at visit 10; KX at 42   PT Start Time 1235   PT Stop Time 1313   PT Time Calculation (min) 38 min   Equipment Utilized During Treatment Other (comment)  rolling walker   Activity Tolerance Patient tolerated treatment well   Behavior During Therapy [facility] for tasks assessed/performed      Past Medical History:  Diagnosis Date  . Benign neoplasm of colon   . Diverticulosis of colon (without mention of hemorrhage)   . Elevated prostate specific antigen (PSA)    BPH, follows with uro  . Esophageal reflux   . Hearing loss    L>R, follows with ENT, wears hearing aides  . Inguinal hernia without mention of obstruction or gangrene, unilateral or unspecified, (not specified as recurrent)   . Internal hemorrhoids without mention of complication   . Malignant neoplasm of bladder, part unspecified   . Other abnormal glucose   . Peripheral vascular disease, unspecified   . Personal history of other lymphatic and hematopoietic neoplasm 2003   Primary cutaneous B-cell lymphoma, s/p xrt  . Spinal stenosis, unspecified region other than cervical    R>L LE pain  . Unspecified essential hypertension     Past Surgical History:  Procedure Laterality Date  . CATARACT EXTRACTION    . LAPAROSCOPY    . PILONIDAL CYST / SINUS EXCISION    . TONSILLECTOMY      There were no vitals filed for this visit.       Subjective Assessment - 03/23/16 1242    Subjective Patient concerned about going out and wants to feel steadier when  walking.  Patient wants to know what type of assistive device to get in order to be more steady.   Patient is accompained by: Family member  wife   Pertinent History history of falls   Limitations Walking   Patient Stated Goals be more steady when walking   Currently in Pain? No/denies            [facility] PT Assessment - 03/23/16 0001      Assessment   Medical Diagnosis Z91.81 at risk for falls   Referring Provider Carolann Littler, MD   Prior Therapy no     Precautions   Precautions Fall     Restrictions   Weight Bearing Restrictions No     Balance Screen   Has the patient fallen in the past 6 months Yes   How many times? 2-3   Has the patient had a decrease in activity level because of a fear of falling?  Yes   Is the patient reluctant to leave their home because of a fear of falling?  Yes     [place] residence   Living Arrangements Spouse/significant other   [place] to enter   Entrance Stairs-Number of Steps 1 small step     Prior Function   Level of Independence Independent   Vocation Retired  Proofreader   Overall Cognitive Status  Within Functional Limits for tasks assessed     Posture/Postural Control   Posture/Postural Control Postural limitations   Postural Limitations Rounded Shoulders;Forward head;Flexed trunk;Increased thoracic kyphosis     ROM / Strength   AROM / PROM / Strength Strength     Strength   Overall Strength Comments overall weakness of LE extensors and core/postural strength due to poor posture     Ambulation/Gait   Gait Pattern Decreased step length - right;Decreased step length - left;Decreased stance time - right;Decreased stance time - left;Decreased stride length;Trunk flexed;Wide base of support;Poor foot clearance - left;Poor foot clearance - right     Berg Balance Test   Sit to Stand Able to stand  independently using hands   Standing Unsupported Able to stand safely 2  minutes   Sitting with Back Unsupported but Feet Supported on Floor or Stool Able to sit safely and securely 2 minutes   Stand to Sit Controls descent by using hands   Transfers Able to transfer safely, definite need of hands   Standing Unsupported with Eyes Closed Able to stand 10 seconds safely   Standing Ubsupported with Feet Together Needs help to attain position but able to stand for 30 seconds with feet together   From Standing, Reach Forward with Outstretched Arm Reaches forward but needs supervision   From Standing Position, Pick up Object from Floor Able to pick up shoe, needs supervision   From Standing Position, Turn to Look Behind Over each Shoulder Turn sideways only but maintains balance   Turn 360 Degrees Able to turn 360 degrees safely but slowly   Standing Unsupported, Alternately Place Feet on Step/Stool Needs assistance to keep from falling or unable to try   Standing Unsupported, One Foot in Front Able to take small step independently and hold 30 seconds   Standing on One Leg Unable to try or needs assist to prevent fall   Total Score 32   Berg comment: Needs AD for walking, medium fall risk                   [facility] Adult PT Treatment/Exercise - 03/23/16 0001      Ambulation/Gait   Ambulation/Gait Yes   Ambulation/Gait Assistance 6: Modified independent (Device/Increase time)   Ambulation Distance (Feet) 100 Feet   Assistive device Other (Comment);1 person hand held assist;Rolling walker  holds onto wife   Ambulation Surface Level   Gait Comments Patient educated in using rolling walker and educated in safe sit<>stand transfers using rolling walker                  PT Short Term Goals - 03/23/16 2116      PT SHORT TERM GOAL #1   Title pt will be fitted for and able to safely use rolling walker to ambulate on level surfaces   Time 4   Period Weeks   Status New     PT SHORT TERM GOAL #2   Title Pt will be able to ambulate with least  restrictive AD up and down stairs   Time 4   Period Weeks   Status New     PT SHORT TERM GOAL #3   Title pt will be independent with initial HEP   Time 4   Period Weeks   Status New           PT Long Term Goals - 03/23/16 2120      PT LONG TERM GOAL #1   Title independent with  advanced HEP   Time 6   Period Weeks   Status New     PT LONG TERM GOAL #2   Title pt able to safely walk in the community on uneven pavement with least restrictive AD for at least 5 minutes at a time   Time 6   Period Weeks   Status New     PT LONG TERM GOAL #3   Title pt improved Berg score to > or = 41/56 in order to demonstrate reduced risk of falls   Baseline 32/56   Time 6   Period Weeks   Status New     PT LONG TERM GOAL #4   Title Pt will be able to get in and out of car safely and independently for increased community activities.   Time 6   Period Weeks   Status New               Plan - 03/23/16 2128    Clinical Impression Statement Pt was seen for low complexity eval due to no clinical factors effecting rehab.  Patient was assessed for balance to decide whether he needed an assistive device.  Based on his flexed posture, wide base of support, small step length and 32/56 Berg balance assessment score, patient is not safe to walk without an assistive device. Due to his impairments, he is recommended to get a standard rolling walker.  Patient attempted to use one in the clinic and was successful with improved gait speed and step length when using the walker.  Skilled PT is needed to work on his impairments and ensure successful use of assistive device in community for reduced risk of falls and maximum function.   Rehab Potential Excellent   Clinical Impairments Affecting Rehab Potential none   PT Frequency 2x / week   PT Duration 6 weeks   PT Treatment/Interventions ADLs/Self Care Home Management;Electrical Stimulation;Cryotherapy;Moist Heat;Traction;Ultrasound;Neuromuscular  re-education;Gait training;Stair training;Functional mobility training;Therapeutic activities;Therapeutic exercise;Patient/family education;Balance training;Manual techniques;Taping;Other (comment);Dry needling   PT Next Visit Plan gait training, stairs, LE strengthening   PT Home Exercise Plan progress as needed   Recommended Other Services pt will need to purchase a standar rolling walker for safe ambulation at this time   Consulted and Agree with Plan of Care Patient      Patient will benefit from skilled therapeutic intervention in order to improve the following deficits and impairments:  Abnormal gait, Decreased activity tolerance, Decreased balance, Decreased mobility, Postural dysfunction, Improper body mechanics, Difficulty walking, Impaired perceived functional ability  Visit Diagnosis: Repeated falls - Plan: PT plan of care cert/re-cert  Muscle weakness (generalized) - Plan: PT plan of care cert/re-cert  Abnormal posture - Plan: PT plan of care cert/re-cert  Difficulty in walking, not elsewhere classified - Plan: PT plan of care cert/re-cert      G-Codes - [id number]    Functional Assessment Tool Used (Outpatient Only) Berg balance and clinical reasoning   Functional Limitation Mobility: Walking and moving around   Mobility: Walking and Moving Around Current Status [phone]) At least 60 percent but less than 80 percent impaired, limited or restricted   Mobility: Walking and Moving Around Goal Status [phone]) At least 40 percent but less than 60 percent impaired, limited or restricted       Problem List Patient Active Problem List   Diagnosis Date Noted  . Cough 01/03/2016  . Syncope and collapse 11/08/2014  . Routine general medical examination at a health care facility 07/30/2014  . Syncope  04/24/2013  . PERIPHERAL VASCULAR DISEASE 07/05/2008  . BLADDER CANCER 07/14/2007  . ELEVATED PROSTATE SPECIFIC ANTIGEN 07/14/2007  . SPINAL STENOSIS 04/03/2007  . NON-HODGKIN'S  LYMPHOMA, HX OF 04/03/2007  . Essential hypertension 11/21/2006  . GERD 11/21/2006  . BPH (benign prostatic hyperplasia) 11/21/2006  . INTERNAL HEMORRHOIDS 11/18/2006    Zannie Cove, PT 03/23/2016, 9:46 PM  [facility] [address] [place], [place], [address] Phone: [phone]   Fax:  [phone]  Name: Piere Sumsion MRN: [id number] Date of Birth: 09/02/1923

## 2016-03-30 ENCOUNTER — Telehealth: Payer: Self-pay | Admitting: Family Medicine

## 2016-03-30 ENCOUNTER — Ambulatory Visit: Payer: Medicare Other

## 2016-03-30 DIAGNOSIS — R296 Repeated falls: Secondary | ICD-10-CM | POA: Diagnosis not present

## 2016-03-30 DIAGNOSIS — M6281 Muscle weakness (generalized): Secondary | ICD-10-CM | POA: Diagnosis not present

## 2016-03-30 DIAGNOSIS — R262 Difficulty in walking, not elsewhere classified: Secondary | ICD-10-CM | POA: Diagnosis not present

## 2016-03-30 DIAGNOSIS — R269 Unspecified abnormalities of gait and mobility: Secondary | ICD-10-CM

## 2016-03-30 DIAGNOSIS — R293 Abnormal posture: Secondary | ICD-10-CM

## 2016-03-30 NOTE — Therapy (Signed)
[facility] [address] [place], [place], [address] Phone: [phone]   Fax:  [phone]  Physical Therapy Treatment  Patient Details  Name: David Howard MRN: [id number] Date of Birth: 1923-07-05 Referring Provider: Carolann Littler, MD  Encounter Date: 03/30/2016      PT End of Session - 03/30/16 1136    Visit Number 2   Number of Visits 10   Date for PT Re-Evaluation 05/04/16   PT Start Time 1105   PT Stop Time 1141   PT Time Calculation (min) 36 min   Activity Tolerance Patient limited by fatigue   Behavior During Therapy [facility] for tasks assessed/performed      Past Medical History:  Diagnosis Date  . Benign neoplasm of colon   . Diverticulosis of colon (without mention of hemorrhage)   . Elevated prostate specific antigen (PSA)    BPH, follows with uro  . Esophageal reflux   . Hearing loss    L>R, follows with ENT, wears hearing aides  . Inguinal hernia without mention of obstruction or gangrene, unilateral or unspecified, (not specified as recurrent)   . Internal hemorrhoids without mention of complication   . Malignant neoplasm of bladder, part unspecified   . Other abnormal glucose   . Peripheral vascular disease, unspecified   . Personal history of other lymphatic and hematopoietic neoplasm 2003   Primary cutaneous B-cell lymphoma, s/p xrt  . Spinal stenosis, unspecified region other than cervical    R>L LE pain  . Unspecified essential hypertension     Past Surgical History:  Procedure Laterality Date  . CATARACT EXTRACTION    . LAPAROSCOPY    . PILONIDAL CYST / SINUS EXCISION    . TONSILLECTOMY      There were no vitals filed for this visit.      Subjective Assessment - 03/30/16 1113    Subjective Pt's wife present and is still working to find out where to get the rolling walker.     Currently in Pain? No/denies                         [facility] Adult PT  Treatment/Exercise - 03/30/16 0001      Exercises   Exercises Knee/Hip;Ankle     Knee/Hip Exercises: Aerobic   Nustep Level 1 x 8 minutes     Knee/Hip Exercises: Seated   Long Arc Quad Strengthening;Both;2 sets;10 reps   [facility] 5" hold x 20   Marching Strengthening;Both;2 sets;10 reps   Sit to [employer] --  seated horzontal abduction 2x10 with yellow band     Ankle Exercises: Seated   Heel Raises 20 reps   Toe Raise 20 reps                PT Education - 03/30/16 1122    Education provided Yes   Education Details seated strength for arms and legs, talke with wife and medical supply to discuss rolling walker   Person(s) Educated Patient   Methods Explanation;Demonstration   Comprehension Verbalized understanding;Returned demonstration          PT Short Term Goals - 03/30/16 1113      PT SHORT TERM GOAL #1   Title pt will be fitted for and able to safely use rolling walker to ambulate on level surfaces   Time 4   Period Weeks   Status On-going     PT SHORT TERM GOAL #2   Title  Pt will be able to ambulate with least restrictive AD up and down stairs   Time 4   Period Weeks   Status On-going     PT SHORT TERM GOAL #3   Title pt will be independent with initial HEP   Time 4   Period Weeks   Status On-going           PT Long Term Goals - 03/23/16 2120      PT LONG TERM GOAL #1   Title independent with advanced HEP   Time 6   Period Weeks   Status New     PT LONG TERM GOAL #2   Title pt able to safely walk in the community on uneven pavement with least restrictive AD for at least 5 minutes at a time   Time 6   Period Weeks   Status New     PT LONG TERM GOAL #3   Title pt improved Berg score to > or = 41/56 in order to demonstrate reduced risk of falls   Baseline 32/56   Time 6   Period Weeks   Status New     PT LONG TERM GOAL #4   Title Pt will be able to get in and out of car safely and independently for increased community activities.    Time 6   Period Weeks   Status New               Plan - 03/30/16 1133    Clinical Impression Statement Session was spent discussing how to obtain a rolling walker and issuing HEP for LE strength.  Pt with poor posture and unsteady gait.  Pt will work on getting a rolling walker for improved safety with gait.  Pt will continue to benefit from skilled PT for LE strength, gait, and endurance.     Rehab Potential Excellent   PT Frequency 2x / week   PT Duration 6 weeks   PT Treatment/Interventions ADLs/Self Care Home Management;Electrical Stimulation;Cryotherapy;Moist Heat;Traction;Ultrasound;Neuromuscular re-education;Gait training;Stair training;Functional mobility training;Therapeutic activities;Therapeutic exercise;Patient/family education;Balance training;Manual techniques;Taping;Other (comment);Dry needling   PT Next Visit Plan gait training, stairs, LE strengthening   Consulted and Agree with Plan of Care Patient      Patient will benefit from skilled therapeutic intervention in order to improve the following deficits and impairments:  Abnormal gait, Decreased activity tolerance, Decreased balance, Decreased mobility, Postural dysfunction, Improper body mechanics, Difficulty walking, Impaired perceived functional ability  Visit Diagnosis: Repeated falls  Muscle weakness (generalized)  Abnormal posture  Difficulty in walking, not elsewhere classified     Problem List Patient Active Problem List   Diagnosis Date Noted  . Cough 01/03/2016  . Syncope and collapse 11/08/2014  . Routine general medical examination at a health care facility 07/30/2014  . Syncope 04/24/2013  . PERIPHERAL VASCULAR DISEASE 07/05/2008  . BLADDER CANCER 07/14/2007  . ELEVATED PROSTATE SPECIFIC ANTIGEN 07/14/2007  . SPINAL STENOSIS 04/03/2007  . NON-HODGKIN'S LYMPHOMA, HX OF 04/03/2007  . Essential hypertension 11/21/2006  . GERD 11/21/2006  . BPH (benign prostatic hyperplasia) 11/21/2006   . INTERNAL HEMORRHOIDS 11/18/2006     Sigurd Sos, PT 03/30/16 11:40 AM  [facility] [address], [place], [place], [address] Phone: [phone]   Fax:  [phone]  Name: Langdon Crosson MRN: [id number] Date of Birth: [birth year]

## 2016-03-30 NOTE — Telephone Encounter (Signed)
I have not seen him in over 2 years.  We can order but he really needs to be seen soon.

## 2016-03-30 NOTE — Patient Instructions (Addendum)
KNEE: Extension, Long Arc Quad (Weight)  Place weight around leg. Raise leg until knee is straight. Hold _5__ seconds. Use ___ lb weight. _10__ reps per set (each leg), 4-5__ sets per day, __7_ days per week  Copyright  VHI. All rights reserved.    Reverse Fly / Shoulder Retraction   Extend both arms in front of body at shoulder height, palms down, holding band. Move arms out to sides, squeeze shoulder blades together. Repeat _10__ times. Do _4-5__ sessions per day.   Copyright  VHI. All rights reserved.     Knee Raise   Lift knee and then lower it. Repeat with other knee. Repeat _10__ times each leg. Do _4-5___ sessions per day.  http://gt2.exer.us/445   Copyright  VHI. All rights reserved.  Toe Up   Gently rise up on toes and back on heels. Repeat _20___ times. Do 4-5____ sessions per day.   Squeeze a ball or pillow between your knees: Hold 5 seconds, do 20.      [facility] [address], [place], [place] [address] Phone # [phone] Fax [phone]

## 2016-03-30 NOTE — Telephone Encounter (Signed)
Pt was seens for Eval for PT and recommended to get a walker.  PT advised the patient we would be the one to order that.  The patient would like the Rx sent to [facility] [phone].  Patient has Medicare Complete.  Patient stated that the therapist sent all notes over to Dr. Elease Hashimoto.

## 2016-03-30 NOTE — Telephone Encounter (Signed)
Okay to order?

## 2016-03-31 NOTE — Telephone Encounter (Signed)
Order faxed.

## 2016-03-31 NOTE — Telephone Encounter (Signed)
Patient had an appointment 03/16/16.  Patient aware that the order has been sent.

## 2016-04-01 ENCOUNTER — Ambulatory Visit: Payer: Medicare Other | Admitting: Physical Therapy

## 2016-04-01 DIAGNOSIS — R293 Abnormal posture: Secondary | ICD-10-CM | POA: Diagnosis not present

## 2016-04-01 DIAGNOSIS — R296 Repeated falls: Secondary | ICD-10-CM

## 2016-04-01 DIAGNOSIS — M6281 Muscle weakness (generalized): Secondary | ICD-10-CM | POA: Diagnosis not present

## 2016-04-01 DIAGNOSIS — R262 Difficulty in walking, not elsewhere classified: Secondary | ICD-10-CM

## 2016-04-01 NOTE — Therapy (Signed)
[facility] [address] [place], [place], [address] Phone: [phone]   Fax:  [phone]  Physical Therapy Treatment  Patient Details  Name: David Howard MRN: [id number] Date of Birth: 1923-07-14 Referring Provider: Carolann Littler, MD  Encounter Date: 04/01/2016      PT End of Session - 04/01/16 1106    Visit Number 3   Number of Visits 10   Date for PT Re-Evaluation 05/04/16   Authorization Type Gcodes needed at visit 10; KX at 52   PT Start Time 1106   PT Stop Time 1144   PT Time Calculation (min) 38 min   Equipment Utilized During Treatment Other (comment)  RW   Activity Tolerance Patient limited by fatigue   Behavior During Therapy [facility] for tasks assessed/performed      Past Medical History:  Diagnosis Date  . Benign neoplasm of colon   . Diverticulosis of colon (without mention of hemorrhage)   . Elevated prostate specific antigen (PSA)    BPH, follows with uro  . Esophageal reflux   . Hearing loss    L>R, follows with ENT, wears hearing aides  . Inguinal hernia without mention of obstruction or gangrene, unilateral or unspecified, (not specified as recurrent)   . Internal hemorrhoids without mention of complication   . Malignant neoplasm of bladder, part unspecified   . Other abnormal glucose   . Peripheral vascular disease, unspecified   . Personal history of other lymphatic and hematopoietic neoplasm 2003   Primary cutaneous B-cell lymphoma, s/p xrt  . Spinal stenosis, unspecified region other than cervical    R>L LE pain  . Unspecified essential hypertension     Past Surgical History:  Procedure Laterality Date  . CATARACT EXTRACTION    . LAPAROSCOPY    . PILONIDAL CYST / SINUS EXCISION    . TONSILLECTOMY      There were no vitals filed for this visit.      Subjective Assessment - 04/01/16 1109    Subjective Pt getting walker on Monday.  I have trouble lifting that leg (left).      Patient is accompained by: Family member  wife   Limitations Walking   Patient Stated Goals be more steady when walking   Currently in Pain? No/denies                         [facility] Adult PT Treatment/Exercise - 04/01/16 0001      Knee/Hip Exercises: Aerobic   Nustep Level 1 x 8 minutes     Knee/Hip Exercises: Standing   Hip Abduction Stengthening;Both;10 reps   Hip Extension Stengthening;Both;10 reps     Knee/Hip Exercises: Seated   Long Arc Quad Strengthening;Both;2 sets;10 reps  3#   Ball Squeeze 5" hold x 20   Marching Strengthening;Both;2 sets;10 reps  3#   Sit to [employer] 2 sets;10 reps;with UE support  sitting on foam     Shoulder Exercises: Seated   Extension Strengthening;Both;20 reps;Theraband   Theraband Level (Shoulder Extension) Level 2 (Red)   Row Strengthening;Theraband;Both;20 reps   Theraband Level (Shoulder Row) Level 2 (Red)   External Rotation Strengthening;Both;20 reps;Theraband   Theraband Level (Shoulder External Rotation) Level 2 (Red)     Shoulder Exercises: Standing   Extension Strengthening;Both;20 reps;Theraband   Theraband Level (Shoulder Extension) Level 3 (Green)   Row Strengthening;Both;Theraband;10 reps   Theraband Level (Shoulder Row) Level 3 (Green)   Other Standing Exercises scap squeezes  cueing for improved posture alignment     Ankle Exercises: Seated   Heel Raises 20 reps   Toe Raise 20 reps                  PT Short Term Goals - 03/30/16 1113      PT SHORT TERM GOAL #1   Title pt will be fitted for and able to safely use rolling walker to ambulate on level surfaces   Time 4   Period Weeks   Status On-going     PT SHORT TERM GOAL #2   Title Pt will be able to ambulate with least restrictive AD up and down stairs   Time 4   Period Weeks   Status On-going     PT SHORT TERM GOAL #3   Title pt will be independent with initial HEP   Time 4   Period Weeks   Status On-going           PT  Long Term Goals - 03/23/16 2120      PT LONG TERM GOAL #1   Title independent with advanced HEP   Time 6   Period Weeks   Status New     PT LONG TERM GOAL #2   Title pt able to safely walk in the community on uneven pavement with least restrictive AD for at least 5 minutes at a time   Time 6   Period Weeks   Status New     PT LONG TERM GOAL #3   Title pt improved Berg score to > or = 41/56 in order to demonstrate reduced risk of falls   Baseline 32/56   Time 6   Period Weeks   Status New     PT LONG TERM GOAL #4   Title Pt will be able to get in and out of car safely and independently for increased community activities.   Time 6   Period Weeks   Status New               Plan - 04/01/16 1138    Clinical Impression Statement Patient tolerated all exercises without pain.  Tolerated well with endurance but did report fatigue.  Pt continues to demonstrate poor posture needed cues throughout all exercises to improved upright alignment.  Skilled PT needed to imprved posture, strength, and endurance for functional activities.   Rehab Potential Excellent   PT Treatment/Interventions ADLs/Self Care Home Management;Electrical Stimulation;Cryotherapy;Moist Heat;Traction;Ultrasound;Neuromuscular re-education;Gait training;Stair training;Functional mobility training;Therapeutic activities;Therapeutic exercise;Patient/family education;Balance training;Manual techniques;Taping;Other (comment);Dry needling   PT Next Visit Plan gait training, stairs, LE strengthening   Consulted and Agree with Plan of Care Patient      Patient will benefit from skilled therapeutic intervention in order to improve the following deficits and impairments:  Abnormal gait, Decreased activity tolerance, Decreased balance, Decreased mobility, Postural dysfunction, Improper body mechanics, Difficulty walking, Impaired perceived functional ability  Visit Diagnosis: Repeated falls  Muscle weakness  (generalized)  Abnormal posture  Difficulty in walking, not elsewhere classified     Problem List Patient Active Problem List   Diagnosis Date Noted  . Cough 01/03/2016  . Syncope and collapse 11/08/2014  . Routine general medical examination at a health care facility 07/30/2014  . Syncope 04/24/2013  . PERIPHERAL VASCULAR DISEASE 07/05/2008  . BLADDER CANCER 07/14/2007  . ELEVATED PROSTATE SPECIFIC ANTIGEN 07/14/2007  . SPINAL STENOSIS 04/03/2007  . NON-HODGKIN'S LYMPHOMA, HX OF 04/03/2007  . Essential hypertension 11/21/2006  . GERD 11/21/2006  .  BPH (benign prostatic hyperplasia) 11/21/2006  . INTERNAL HEMORRHOIDS 11/18/2006    Zannie Cove, PT 04/01/2016, 1:32 PM  [facility] [address], [place], [place], [address] Phone: [phone]   Fax:  [phone]  Name: David Howard MRN: [id number] Date of Birth: Aug 26, 1923

## 2016-04-02 NOTE — Telephone Encounter (Signed)
Left message on machine at [facility] for them to return our call

## 2016-04-02 NOTE — Telephone Encounter (Signed)
Pts wife is calling and stating that [facility] [employer] need more information in order for the pt to get the walker Tanisha @ [place] [phone](P)  [phone] (F).  Pt state that they will deliver the walker on Monday if the needed information is sent in today pt is still going to the therapy at the [facility] [place] (pt is having therapy with Kennyth Lose).

## 2016-04-05 ENCOUNTER — Ambulatory Visit: Payer: Medicare Other

## 2016-04-05 DIAGNOSIS — R262 Difficulty in walking, not elsewhere classified: Secondary | ICD-10-CM | POA: Diagnosis not present

## 2016-04-05 DIAGNOSIS — R293 Abnormal posture: Secondary | ICD-10-CM

## 2016-04-05 DIAGNOSIS — R296 Repeated falls: Secondary | ICD-10-CM

## 2016-04-05 DIAGNOSIS — M6281 Muscle weakness (generalized): Secondary | ICD-10-CM

## 2016-04-05 NOTE — Therapy (Signed)
[facility] [address] [place], [place], [address] Phone: [phone]   Fax:  [phone]  Physical Therapy Treatment  Patient Details  Name: David Howard MRN: [id number] Date of Birth: 12-04-1923 Referring Provider: Carolann Littler, MD  Encounter Date: 04/05/2016      PT End of Session - 04/05/16 1039    Visit Number 4   Number of Visits 10   Date for PT Re-Evaluation 05/04/16   Authorization Type Gcodes needed at visit 10; KX at 4   PT Start Time 1016   PT Stop Time 1055   PT Time Calculation (min) 39 min   Activity Tolerance Patient tolerated treatment well   Behavior During Therapy [facility] for tasks assessed/performed      Past Medical History:  Diagnosis Date  . Benign neoplasm of colon   . Diverticulosis of colon (without mention of hemorrhage)   . Elevated prostate specific antigen (PSA)    BPH, follows with uro  . Esophageal reflux   . Hearing loss    L>R, follows with ENT, wears hearing aides  . Inguinal hernia without mention of obstruction or gangrene, unilateral or unspecified, (not specified as recurrent)   . Internal hemorrhoids without mention of complication   . Malignant neoplasm of bladder, part unspecified   . Other abnormal glucose   . Peripheral vascular disease, unspecified   . Personal history of other lymphatic and hematopoietic neoplasm 2003   Primary cutaneous B-cell lymphoma, s/p xrt  . Spinal stenosis, unspecified region other than cervical    R>L LE pain  . Unspecified essential hypertension     Past Surgical History:  Procedure Laterality Date  . CATARACT EXTRACTION    . LAPAROSCOPY    . PILONIDAL CYST / SINUS EXCISION    . TONSILLECTOMY      There were no vitals filed for this visit.      Subjective Assessment - 04/05/16 1020    Subjective I wish i was more mobile.     Currently in Pain? No/denies                         [facility] Adult PT  Treatment/Exercise - 04/05/16 0001      Knee/Hip Exercises: Aerobic   Nustep Level 1 x 8 minutes     Knee/Hip Exercises: Standing   Hip Abduction Stengthening;Both;10 reps;2 sets   Hip Extension Stengthening;Both;10 reps;2 sets     Knee/Hip Exercises: Seated   Long Arc Quad Strengthening;Both;2 sets;10 reps  3#   Ball Squeeze 5" hold x 20   Marching Strengthening;Both;2 sets;10 reps  3#     Shoulder Exercises: Scientist, water quality;Theraband;Both;20 reps   Theraband Level (Shoulder Row) Level 2 (Red)   External Rotation Strengthening;Both;20 reps;Theraband   Theraband Level (Shoulder External Rotation) Level 2 (Red)     Shoulder Exercises: Standing   Other Standing Exercises scap squeezes  cueing for improved posture alignment                  PT Short Term Goals - 04/05/16 1022      PT SHORT TERM GOAL #1   Title pt will be fitted for and able to safely use rolling walker to ambulate on level surfaces   Baseline working on getting from medical supply   Time 4   Period Weeks   Status On-going     PT SHORT TERM GOAL #2   Title Pt will  be able to ambulate with least restrictive AD up and down stairs   Time 4   Period Weeks   Status On-going     PT SHORT TERM GOAL #3   Title pt will be independent with initial HEP   Status Achieved           PT Long Term Goals - 03/23/16 2120      PT LONG TERM GOAL #1   Title independent with advanced HEP   Time 6   Period Weeks   Status New     PT LONG TERM GOAL #2   Title pt able to safely walk in the community on uneven pavement with least restrictive AD for at least 5 minutes at a time   Time 6   Period Weeks   Status New     PT LONG TERM GOAL #3   Title pt improved Berg score to > or = 41/56 in order to demonstrate reduced risk of falls   Baseline 32/56   Time 6   Period Weeks   Status New     PT LONG TERM GOAL #4   Title Pt will be able to get in and out of car safely and independently for  increased community activities.   Time 6   Period Weeks   Status New               Plan - 04/05/16 1029    Clinical Impression Statement Pt is still trying to get a walker.  Having trouble getting order from the MD.  Pt with poor posture especially in standing and needs cues throughout to improve alignment with exercise in standing.  Pt will continue to benefit from skilled PT for strength, endurance and mobility to imrprove safety.     Rehab Potential Excellent   PT Frequency 2x / week   PT Duration 6 weeks   PT Treatment/Interventions ADLs/Self Care Home Management;Electrical Stimulation;Cryotherapy;Moist Heat;Traction;Ultrasound;Neuromuscular re-education;Gait training;Stair training;Functional mobility training;Therapeutic activities;Therapeutic exercise;Patient/family education;Balance training;Manual techniques;Taping;Other (comment);Dry needling   PT Next Visit Plan gait training, , LE strengthening   Consulted and Agree with Plan of Care Patient      Patient will benefit from skilled therapeutic intervention in order to improve the following deficits and impairments:  Abnormal gait, Decreased activity tolerance, Decreased balance, Decreased mobility, Postural dysfunction, Improper body mechanics, Difficulty walking, Impaired perceived functional ability  Visit Diagnosis: Repeated falls  Muscle weakness (generalized)  Abnormal posture  Difficulty in walking, not elsewhere classified     Problem List Patient Active Problem List   Diagnosis Date Noted  . Cough 01/03/2016  . Syncope and collapse 11/08/2014  . Routine general medical examination at a health care facility 07/30/2014  . Syncope 04/24/2013  . PERIPHERAL VASCULAR DISEASE 07/05/2008  . BLADDER CANCER 07/14/2007  . ELEVATED PROSTATE SPECIFIC ANTIGEN 07/14/2007  . SPINAL STENOSIS 04/03/2007  . NON-HODGKIN'S LYMPHOMA, HX OF 04/03/2007  . Essential hypertension 11/21/2006  . GERD 11/21/2006  . BPH  (benign prostatic hyperplasia) 11/21/2006  . INTERNAL HEMORRHOIDS 11/18/2006     David Howard, PT 04/05/16 10:51 AM  [facility] [address] [place], [place], [address] Phone: [phone]   Fax:  [phone]  Name: Uriel Dowding MRN: [id number] Date of Birth: 06-05-1923

## 2016-04-05 NOTE — Telephone Encounter (Signed)
Pts wife is calling to check the status of the form being faxed over.  Pts wife is aware of the below msg.

## 2016-04-05 NOTE — Telephone Encounter (Signed)
attempted to fax office notes.  Fax line busy.  Will try again.

## 2016-04-06 NOTE — Telephone Encounter (Signed)
An e-mailed copy was sent

## 2016-04-07 DIAGNOSIS — M4800 Spinal stenosis, site unspecified: Secondary | ICD-10-CM | POA: Diagnosis not present

## 2016-04-07 DIAGNOSIS — R2689 Other abnormalities of gait and mobility: Secondary | ICD-10-CM | POA: Diagnosis not present

## 2016-04-08 ENCOUNTER — Ambulatory Visit: Payer: Medicare Other

## 2016-04-08 DIAGNOSIS — R296 Repeated falls: Secondary | ICD-10-CM | POA: Diagnosis not present

## 2016-04-08 DIAGNOSIS — R293 Abnormal posture: Secondary | ICD-10-CM

## 2016-04-08 DIAGNOSIS — M6281 Muscle weakness (generalized): Secondary | ICD-10-CM | POA: Diagnosis not present

## 2016-04-08 DIAGNOSIS — R262 Difficulty in walking, not elsewhere classified: Secondary | ICD-10-CM

## 2016-04-08 NOTE — Therapy (Signed)
[facility] [address], [place], [place], [address] Phone: [phone]   Fax:  [phone]  Physical Therapy Treatment  Patient Details  Name: David Howard MRN: [id number] Date of Birth: [birth year] Referring Provider: Carolann Littler, MD  Encounter Date: 04/08/2016      PT End of Session - 04/08/16 1100    Visit Number 5   Number of Visits 10   Date for PT Re-Evaluation 05/04/16   Authorization Type Gcodes needed at visit 10; KX at 57   PT Start Time 1020   PT Stop Time 1058   PT Time Calculation (min) 38 min   Activity Tolerance Patient tolerated treatment well   Behavior During Therapy [facility] for tasks assessed/performed      Past Medical History:  Diagnosis Date  . Benign neoplasm of colon   . Diverticulosis of colon (without mention of hemorrhage)   . Elevated prostate specific antigen (PSA)    BPH, follows with uro  . Esophageal reflux   . Hearing loss    L>R, follows with ENT, wears hearing aides  . Inguinal hernia without mention of obstruction or gangrene, unilateral or unspecified, (not specified as recurrent)   . Internal hemorrhoids without mention of complication   . Malignant neoplasm of bladder, part unspecified   . Other abnormal glucose   . Peripheral vascular disease, unspecified   . Personal history of other lymphatic and hematopoietic neoplasm 2003   Primary cutaneous B-cell lymphoma, s/p xrt  . Spinal stenosis, unspecified region other than cervical    R>L LE pain  . Unspecified essential hypertension     Past Surgical History:  Procedure Laterality Date  . CATARACT EXTRACTION    . LAPAROSCOPY    . PILONIDAL CYST / SINUS EXCISION    . TONSILLECTOMY      There were no vitals filed for this visit.      Subjective Assessment - 04/08/16 1034    Subjective Pt has his walker now.     Patient Stated Goals be more steady when walking   Currently in Pain? No/denies                          [facility] Adult PT Treatment/Exercise - 04/08/16 0001      Ambulation/Gait   Ambulation/Gait Yes   Ambulation/Gait Assistance 6: Modified independent (Device/Increase time)   Assistive device Rolling walker   Gait Pattern Step-through pattern   Ambulation Surface Level   Gait Comments demo and verbal cues for walker use     Knee/Hip Exercises: Aerobic   Nustep Level 1 x 8 minutes     Knee/Hip Exercises: Standing   Heel Raises Both;2 sets;10 reps   Hip Abduction Stengthening;Both;10 reps;2 sets   Hip Extension Stengthening;Both;10 reps;2 sets     Knee/Hip Exercises: Seated   Long Arc Quad Strengthening;Both;2 sets;10 reps  3#   Ball Squeeze 5" hold x 20   Marching Strengthening;Both;2 sets;10 reps  3#   Sit to [employer] 2 sets;10 reps;with UE support  sitting on foam     Shoulder Exercises: Scientist, water quality;Theraband;Both;20 reps   Theraband Level (Shoulder Row) Level 2 (Red)   Other Seated Exercises shoulder flexion 1# 2x10     Shoulder Exercises: Standing   Other Standing Exercises scap squeezes  cueing for improved posture alignment                  PT Short Term  Goals - 04/05/16 1022      PT SHORT TERM GOAL #1   Title pt will be fitted for and able to safely use rolling walker to ambulate on level surfaces   Baseline working on getting from medical supply   Time 4   Period Weeks   Status On-going     PT SHORT TERM GOAL #2   Title Pt will be able to ambulate with least restrictive AD up and down stairs   Time 4   Period Weeks   Status On-going     PT SHORT TERM GOAL #3   Title pt will be independent with initial HEP   Status Achieved           PT Long Term Goals - 03/23/16 2120      PT LONG TERM GOAL #1   Title independent with advanced HEP   Time 6   Period Weeks   Status New     PT LONG TERM GOAL #2   Title pt able to safely walk in the community on uneven pavement with least restrictive AD  for at least 5 minutes at a time   Time 6   Period Weeks   Status New     PT LONG TERM GOAL #3   Title pt improved Berg score to > or = 41/56 in order to demonstrate reduced risk of falls   Baseline 32/56   Time 6   Period Weeks   Status New     PT LONG TERM GOAL #4   Title Pt will be able to get in and out of car safely and independently for increased community activities.   Time 6   Period Weeks   Status New               Plan - 04/08/16 1048    Clinical Impression Statement Pt now has a walker and PT assisted in adjusting to the correct height and has received education regarding correct use.  Pt with poor standing posture and has improved awareness with standing exercises today.  Pt will continue to benefit from skilled PT for balance, LE strength, gait and endurance.     Rehab Potential Excellent   PT Frequency 2x / week   PT Duration 6 weeks   PT Treatment/Interventions ADLs/Self Care Home Management;Electrical Stimulation;Cryotherapy;Moist Heat;Traction;Ultrasound;Neuromuscular re-education;Gait training;Stair training;Functional mobility training;Therapeutic activities;Therapeutic exercise;Patient/family education;Balance training;Manual techniques;Taping;Other (comment);Dry needling   PT Next Visit Plan gait training, , LE strengthening   Consulted and Agree with Plan of Care Patient      Patient will benefit from skilled therapeutic intervention in order to improve the following deficits and impairments:  Abnormal gait, Decreased activity tolerance, Decreased balance, Decreased mobility, Postural dysfunction, Improper body mechanics, Difficulty walking, Impaired perceived functional ability  Visit Diagnosis: Repeated falls  Muscle weakness (generalized)  Abnormal posture  Difficulty in walking, not elsewhere classified     Problem List Patient Active Problem List   Diagnosis Date Noted  . Cough 01/03/2016  . Syncope and collapse 11/08/2014  . Routine  general medical examination at a health care facility 07/30/2014  . Syncope 04/24/2013  . PERIPHERAL VASCULAR DISEASE 07/05/2008  . BLADDER CANCER 07/14/2007  . ELEVATED PROSTATE SPECIFIC ANTIGEN 07/14/2007  . SPINAL STENOSIS 04/03/2007  . NON-HODGKIN'S LYMPHOMA, HX OF 04/03/2007  . Essential hypertension 11/21/2006  . GERD 11/21/2006  . BPH (benign prostatic hyperplasia) 11/21/2006  . INTERNAL HEMORRHOIDS 11/18/2006     Sigurd Sos, PT 04/08/16 11:02 AM  [facility] [address] [place], [place], [address] Phone: [phone]   Fax:  [phone]  Name: David Howard MRN: [id number] Date of Birth: [birth year]

## 2016-04-12 ENCOUNTER — Ambulatory Visit: Payer: Medicare Other | Admitting: Physical Therapy

## 2016-04-12 ENCOUNTER — Encounter: Payer: Self-pay | Admitting: Physical Therapy

## 2016-04-12 DIAGNOSIS — R262 Difficulty in walking, not elsewhere classified: Secondary | ICD-10-CM

## 2016-04-12 DIAGNOSIS — R293 Abnormal posture: Secondary | ICD-10-CM

## 2016-04-12 DIAGNOSIS — M6281 Muscle weakness (generalized): Secondary | ICD-10-CM

## 2016-04-12 DIAGNOSIS — R296 Repeated falls: Secondary | ICD-10-CM | POA: Diagnosis not present

## 2016-04-12 NOTE — Therapy (Signed)
[facility] [address] [place], [place], [address] Phone: [phone]   Fax:  [phone]  Physical Therapy Treatment  Patient Details  Name: David Howard MRN: [id number] Date of Birth: 03-Dec-1923 Referring Provider: Carolann Littler, MD  Encounter Date: 04/12/2016      PT End of Session - 04/12/16 1016    Visit Number 6   Number of Visits 10   Date for PT Re-Evaluation 05/04/16   Authorization Type Gcodes needed at visit 10; KX at 65   PT Start Time 1016   PT Stop Time 1059   PT Time Calculation (min) 43 min   Activity Tolerance Patient tolerated treatment well   Behavior During Therapy [facility] for tasks assessed/performed      Past Medical History:  Diagnosis Date  . Benign neoplasm of colon   . Diverticulosis of colon (without mention of hemorrhage)   . Elevated prostate specific antigen (PSA)    BPH, follows with uro  . Esophageal reflux   . Hearing loss    L>R, follows with ENT, wears hearing aides  . Inguinal hernia without mention of obstruction or gangrene, unilateral or unspecified, (not specified as recurrent)   . Internal hemorrhoids without mention of complication   . Malignant neoplasm of bladder, part unspecified   . Other abnormal glucose   . Peripheral vascular disease, unspecified   . Personal history of other lymphatic and hematopoietic neoplasm 2003   Primary cutaneous B-cell lymphoma, s/p xrt  . Spinal stenosis, unspecified region other than cervical    R>L LE pain  . Unspecified essential hypertension     Past Surgical History:  Procedure Laterality Date  . CATARACT EXTRACTION    . LAPAROSCOPY    . PILONIDAL CYST / SINUS EXCISION    . TONSILLECTOMY      There were no vitals filed for this visit.      Subjective Assessment - 04/12/16 1022    Subjective Pt has not been outside with the walker   Patient is accompained by: Family member  wife   Pertinent History history of  falls   Limitations Walking   Patient Stated Goals be more steady when walking   Currently in Pain? No/denies                         [facility] Adult PT Treatment/Exercise - 04/12/16 0001      Timed Up and Go Test   TUG Normal TUG     Therapeutic Activites    Therapeutic Activities Other Therapeutic Activities   Other Therapeutic Activities standing, stepping, walking     Neuro Re-ed    Neuro Re-ed Details  posture re-ed     Knee/Hip Exercises: Aerobic   Nustep Level 1 x 8 minutes     Knee/Hip Exercises: Standing   Heel Raises Both;2 sets;10 reps   Hip Abduction Stengthening;Both;10 reps;2 sets   Hip Extension Stengthening;Both;10 reps;2 sets   Forward Step Up Both;10 reps;Hand Hold: 1;Step Height: 4"     Knee/Hip Exercises: Seated   Long Arc Quad Strengthening;Both;2 sets;10 reps  3#   Ball Squeeze 5" hold x 20   Clamshell with TheraBand Red  30x   Marching Strengthening;Both;2 sets;10 reps  4# (has some left knee pain)   Sit to Sand 2 sets;10 reps;with UE support  sitting on foam     Shoulder Exercises: Standing   Extension Strengthening;Both;20 reps;Theraband   Theraband Level (Shoulder Extension) Level  3 (Green)   Other Standing Exercises scap squeezes - 3 x 10   cueing for improved posture alignment                PT Education - 04/12/16 1102    Education provided Yes   Education Details seated scap squeezes   Person(s) Educated Patient   Methods Explanation;Demonstration;Tactile cues;Verbal cues;Handout   Comprehension Verbalized understanding;Returned demonstration          PT Short Term Goals - 04/12/16 1150      PT SHORT TERM GOAL #1   Title pt will be fitted for and able to safely use rolling walker to ambulate on level surfaces   Baseline needing some cues for safety   Time 4   Period Weeks   Status On-going     PT SHORT TERM GOAL #2   Title Pt will be able to ambulate with least restrictive AD up and down stairs    Time 4   Period Weeks   Status On-going           PT Long Term Goals - 04/12/16 1151      PT LONG TERM GOAL #1   Title independent with advanced HEP   Time 6   Period Weeks   Status On-going     PT LONG TERM GOAL #2   Title pt able to safely walk in the community on uneven pavement with least restrictive AD for at least 5 minutes at a time   Time 6   Period Weeks   Status On-going     PT LONG TERM GOAL #3   Title pt improved Berg score to > or = 41/56 in order to demonstrate reduced risk of falls   Baseline 32/56   Time 6   Period Weeks   Status On-going     PT LONG TERM GOAL #4   Title Pt will be able to get in and out of car safely and independently for increased community activities.   Time 6   Period Weeks   Status On-going               Plan - 04/12/16 1017    Clinical Impression Statement Pt able to perform seated exercises in more upright position with a lot of verbal and tactile cues.  Pt demonstrates improved safety with RW at end of session after education and cuing.  Pt will benefit from skilled PT for improved posture and decreased risk of falls   Rehab Potential Excellent   Clinical Impairments Affecting Rehab Potential none   PT Treatment/Interventions ADLs/Self Care Home Management;Electrical Stimulation;Cryotherapy;Moist Heat;Traction;Ultrasound;Neuromuscular re-education;Gait training;Stair training;Functional mobility training;Therapeutic activities;Therapeutic exercise;Patient/family education;Balance training;Manual techniques;Taping;Other (comment);Dry needling   PT Next Visit Plan gait training, , LE strengthening, balance, hip flexion strength   Consulted and Agree with Plan of Care Patient      Patient will benefit from skilled therapeutic intervention in order to improve the following deficits and impairments:  Abnormal gait, Decreased activity tolerance, Decreased balance, Decreased mobility, Postural dysfunction, Improper body  mechanics, Difficulty walking, Impaired perceived functional ability  Visit Diagnosis: Repeated falls  Muscle weakness (generalized)  Abnormal posture  Difficulty in walking, not elsewhere classified     Problem List Patient Active Problem List   Diagnosis Date Noted  . Cough 01/03/2016  . Syncope and collapse 11/08/2014  . Routine general medical examination at a health care facility 07/30/2014  . Syncope 04/24/2013  . PERIPHERAL VASCULAR DISEASE 07/05/2008  . BLADDER CANCER  07/14/2007  . ELEVATED PROSTATE SPECIFIC ANTIGEN 07/14/2007  . SPINAL STENOSIS 04/03/2007  . NON-HODGKIN'S LYMPHOMA, HX OF 04/03/2007  . Essential hypertension 11/21/2006  . GERD 11/21/2006  . BPH (benign prostatic hyperplasia) 11/21/2006  . INTERNAL HEMORRHOIDS 11/18/2006    Zannie Cove , PT 04/12/2016, 11:51 AM  [facility] [address] [place], [place], [address] Phone: [phone]   Fax:  [phone]  Name: Jahson Emanuele MRN: [id number] Date of Birth: [birth year]

## 2016-04-12 NOTE — Patient Instructions (Signed)
Shoulder Blade Squeeze    Sitting up away from the back of the chair.  Rotate shoulders back, then squeeze shoulder blades together. Repeat __10__ times. Do __3__ sessions per day.  http://gt2.exer.us/846   Copyright  VHI. All rights reserved.

## 2016-04-13 DIAGNOSIS — L821 Other seborrheic keratosis: Secondary | ICD-10-CM | POA: Diagnosis not present

## 2016-04-13 DIAGNOSIS — D692 Other nonthrombocytopenic purpura: Secondary | ICD-10-CM | POA: Diagnosis not present

## 2016-04-13 DIAGNOSIS — L578 Other skin changes due to chronic exposure to nonionizing radiation: Secondary | ICD-10-CM | POA: Diagnosis not present

## 2016-04-13 DIAGNOSIS — D1801 Hemangioma of skin and subcutaneous tissue: Secondary | ICD-10-CM | POA: Diagnosis not present

## 2016-04-15 ENCOUNTER — Ambulatory Visit: Payer: Medicare Other

## 2016-04-15 DIAGNOSIS — R296 Repeated falls: Secondary | ICD-10-CM | POA: Diagnosis not present

## 2016-04-15 DIAGNOSIS — R293 Abnormal posture: Secondary | ICD-10-CM | POA: Diagnosis not present

## 2016-04-15 DIAGNOSIS — M6281 Muscle weakness (generalized): Secondary | ICD-10-CM | POA: Diagnosis not present

## 2016-04-15 DIAGNOSIS — R262 Difficulty in walking, not elsewhere classified: Secondary | ICD-10-CM | POA: Diagnosis not present

## 2016-04-15 NOTE — Therapy (Signed)
[facility] [address] [place], [place], [address] Phone: [phone]   Fax:  [phone]  Physical Therapy Treatment  Patient Details  Name: David Howard MRN: [id number] Date of Birth: [birth year] Referring Provider: Carolann Littler, MD  Encounter Date: 04/15/2016      PT End of Session - 04/15/16 1218    Visit Number 7   Number of Visits 10   Date for PT Re-Evaluation 05/04/16   Authorization Type Gcodes needed at visit 10; KX at 10   PT Start Time 1147   PT Stop Time 1222   PT Time Calculation (min) 35 min   Activity Tolerance Patient limited by pain   Behavior During Therapy [facility] for tasks assessed/performed      Past Medical History:  Diagnosis Date  . Benign neoplasm of colon   . Diverticulosis of colon (without mention of hemorrhage)   . Elevated prostate specific antigen (PSA)    BPH, follows with uro  . Esophageal reflux   . Hearing loss    L>R, follows with ENT, wears hearing aides  . Inguinal hernia without mention of obstruction or gangrene, unilateral or unspecified, (not specified as recurrent)   . Internal hemorrhoids without mention of complication   . Malignant neoplasm of bladder, part unspecified   . Other abnormal glucose   . Peripheral vascular disease, unspecified   . Personal history of other lymphatic and hematopoietic neoplasm 2003   Primary cutaneous B-cell lymphoma, s/p xrt  . Spinal stenosis, unspecified region other than cervical    R>L LE pain  . Unspecified essential hypertension     Past Surgical History:  Procedure Laterality Date  . CATARACT EXTRACTION    . LAPAROSCOPY    . PILONIDAL CYST / SINUS EXCISION    . TONSILLECTOMY      There were no vitals filed for this visit.      Subjective Assessment - 04/15/16 1153    Subjective Pt reports increased Lt knee pain since the start of care.  It is worse when I lift my Lt leg to get in/out of the car.     Currently in Pain? Yes   Pain Score 5    Pain Location Knee   Pain Orientation Left   Pain Descriptors / Indicators Aching;Sore   Pain Type Acute pain   Pain Onset 1 to 4 weeks ago   Pain Frequency Intermittent   Aggravating Factors  lifting left leg, getting in/out of the car   Pain Relieving Factors sitting                         [facility] Adult PT Treatment/Exercise - 04/15/16 0001      Knee/Hip Exercises: Aerobic   Nustep Level 1 x 8 minutes  arms and Rt leg only     Knee/Hip Exercises: Standing   Heel Raises Both;2 sets;10 reps     Knee/Hip Exercises: Seated   Long Arc Quad Strengthening;Both;2 sets;10 reps  3# on Rt only.  No weight on Lt   Ball Squeeze 5" hold x 20   Clamshell with TheraBand Red  30x   Marching Strengthening;Both;2 sets;10 reps  Rt LE only with 3#     Shoulder Exercises: Seated   Other Seated Exercises shoulder flexion 1# 2x10     Shoulder Exercises: Standing   Extension Strengthening;Both;20 reps;Theraband   Theraband Level (Shoulder Extension) Level 3 (Green)  PT Short Term Goals - 04/12/16 1150      PT SHORT TERM GOAL #1   Title pt will be fitted for and able to safely use rolling walker to ambulate on level surfaces   Baseline needing some cues for safety   Time 4   Period Weeks   Status On-going     PT SHORT TERM GOAL #2   Title Pt will be able to ambulate with least restrictive AD up and down stairs   Time 4   Period Weeks   Status On-going           PT Long Term Goals - 04/12/16 1151      PT LONG TERM GOAL #1   Title independent with advanced HEP   Time 6   Period Weeks   Status On-going     PT LONG TERM GOAL #2   Title pt able to safely walk in the community on uneven pavement with least restrictive AD for at least 5 minutes at a time   Time 6   Period Weeks   Status On-going     PT LONG TERM GOAL #3   Title pt improved Berg score to > or = 41/56 in order to demonstrate  reduced risk of falls   Baseline 32/56   Time 6   Period Weeks   Status On-going     PT LONG TERM GOAL #4   Title Pt will be able to get in and out of car safely and independently for increased community activities.   Time 6   Period Weeks   Status On-going               Plan - 04/15/16 1159    Clinical Impression Statement Pt with complaints of Lt knee pain since starting PT.  Pt with Lt knee pain with Lt hip flexion and no increased in pain with movement of the Lt knee with exercise today.  PT reduced the resistance and reps on exercise on the Rt LE and performed more exercise seated.  Pt with poor posture that is improving with improved awareness and correction with exercise.  Pt has walker and reports that he is only using it when coming to PT.  Pt will continue to benefit from skilled PT for strength, endurance, gait and posture exercises.     Rehab Potential Excellent   PT Frequency 2x / week   PT Duration 6 weeks   PT Treatment/Interventions ADLs/Self Care Home Management;Electrical Stimulation;Cryotherapy;Moist Heat;Traction;Ultrasound;Neuromuscular re-education;Gait training;Stair training;Functional mobility training;Therapeutic activities;Therapeutic exercise;Patient/family education;Balance training;Manual techniques;Taping;Other (comment);Dry needling   PT Next Visit Plan gait training, , LE strengthening, balance, hip flexion strength.  Assess Lt knee pain and modify exercise as needed.     Consulted and Agree with Plan of Care Patient      Patient will benefit from skilled therapeutic intervention in order to improve the following deficits and impairments:  Abnormal gait, Decreased activity tolerance, Decreased balance, Decreased mobility, Postural dysfunction, Improper body mechanics, Difficulty walking, Impaired perceived functional ability  Visit Diagnosis: Muscle weakness (generalized)  Repeated falls  Abnormal posture  Difficulty in walking, not elsewhere  classified     Problem List Patient Active Problem List   Diagnosis Date Noted  . Cough 01/03/2016  . Syncope and collapse 11/08/2014  . Routine general medical examination at a health care facility 07/30/2014  . Syncope 04/24/2013  . PERIPHERAL VASCULAR DISEASE 07/05/2008  . BLADDER CANCER 07/14/2007  . ELEVATED PROSTATE SPECIFIC ANTIGEN 07/14/2007  .  SPINAL STENOSIS 04/03/2007  . NON-HODGKIN'S LYMPHOMA, HX OF 04/03/2007  . Essential hypertension 11/21/2006  . GERD 11/21/2006  . BPH (benign prostatic hyperplasia) 11/21/2006  . INTERNAL HEMORRHOIDS 11/18/2006    Sigurd Sos, PT 04/15/16 12:22 PM  [facility] [address], [place], [place], [address] Phone: [phone]   Fax:  [phone]  Name: Lyndle Pang MRN: [id number] Date of Birth: [birth year]

## 2016-04-22 ENCOUNTER — Encounter: Payer: Self-pay | Admitting: Physical Therapy

## 2016-04-22 ENCOUNTER — Ambulatory Visit: Payer: Medicare Other | Attending: Family Medicine | Admitting: Physical Therapy

## 2016-04-22 DIAGNOSIS — M6281 Muscle weakness (generalized): Secondary | ICD-10-CM

## 2016-04-22 DIAGNOSIS — R262 Difficulty in walking, not elsewhere classified: Secondary | ICD-10-CM

## 2016-04-22 DIAGNOSIS — R293 Abnormal posture: Secondary | ICD-10-CM | POA: Diagnosis not present

## 2016-04-22 DIAGNOSIS — R296 Repeated falls: Secondary | ICD-10-CM

## 2016-04-24 DIAGNOSIS — R262 Difficulty in walking, not elsewhere classified: Secondary | ICD-10-CM | POA: Diagnosis not present

## 2016-04-24 DIAGNOSIS — R296 Repeated falls: Secondary | ICD-10-CM | POA: Diagnosis not present

## 2016-04-24 DIAGNOSIS — R293 Abnormal posture: Secondary | ICD-10-CM | POA: Diagnosis not present

## 2016-04-24 DIAGNOSIS — M6281 Muscle weakness (generalized): Secondary | ICD-10-CM | POA: Diagnosis not present

## 2016-04-24 NOTE — Therapy (Signed)
[facility] [address] [place], [place], [address] Phone: [phone]   Fax:  [phone]  Physical Therapy Treatment  Patient Details  Name: David Howard MRN: [id number] Date of Birth: 05/27/1923 Referring Provider: Carolann Littler, MD  Encounter Date: 04/22/2016      PT End of Session - 04/24/16 1640    Visit Number 8   Number of Visits 10   Date for PT Re-Evaluation 05/04/16   Authorization Type Gcodes needed at visit 10; KX at 37   PT Start Time 1015   PT Stop Time 1059   PT Time Calculation (min) 44 min   Equipment Utilized During Treatment Other (comment)  RW   Activity Tolerance Patient limited by pain   Behavior During Therapy [facility] for tasks assessed/performed      Past Medical History:  Diagnosis Date  . Benign neoplasm of colon   . Diverticulosis of colon (without mention of hemorrhage)   . Elevated prostate specific antigen (PSA)    BPH, follows with uro  . Esophageal reflux   . Hearing loss    L>R, follows with ENT, wears hearing aides  . Inguinal hernia without mention of obstruction or gangrene, unilateral or unspecified, (not specified as recurrent)   . Internal hemorrhoids without mention of complication   . Malignant neoplasm of bladder, part unspecified   . Other abnormal glucose   . Peripheral vascular disease, unspecified   . Personal history of other lymphatic and hematopoietic neoplasm 2003   Primary cutaneous B-cell lymphoma, s/p xrt  . Spinal stenosis, unspecified region other than cervical    R>L LE pain  . Unspecified essential hypertension     Past Surgical History:  Procedure Laterality Date  . CATARACT EXTRACTION    . LAPAROSCOPY    . PILONIDAL CYST / SINUS EXCISION    . TONSILLECTOMY      There were no vitals filed for this visit.      Subjective Assessment - 04/24/16 1639    Subjective My right side started bothering me this morning but left knee been  bothering me for a couple of months.  Pt states he has gotten the walker which is what he came in for.  He reports that none of his pain is improving by coming to PT and he would like for today to be his last day.   Pertinent History history of falls   Limitations Walking   Patient Stated Goals be more steady when walking   Currently in Pain? Yes   Pain Score 5    Pain Location Knee   Pain Orientation Left   Pain Descriptors / Indicators Aching   Pain Type Chronic pain   Pain Onset More than a month ago   Pain Frequency Intermittent   Aggravating Factors  moving   Pain Relieving Factors rest   Effect of Pain on Daily Activities pain during activities but not limiting   Multiple Pain Sites No                         [facility] Adult PT Treatment/Exercise - 04/24/16 0001      Ambulation/Gait   Ambulation/Gait Yes   Ambulation/Gait Assistance 6: Modified independent (Device/Increase time)   Ambulation Distance (Feet) 200 Feet   Assistive device Rolling walker   Gait Pattern Step-through pattern   Ambulation Surface Level   Pre-Gait Activities education on gait pattern   Gait Comments --  Therapeutic Activites    Therapeutic Activities ADL's   Other Therapeutic Activities walking with RW on even surface, supine<>sit and transfers  pain in knee when elevating left LE onto mat     Cryotherapy   Number Minutes Cryotherapy 15 Minutes   Cryotherapy Location Knee   Type of Cryotherapy Ice pack     Electrical Stimulation   Electrical Stimulation Location left knee   Electrical Stimulation Action IFC   Electrical Stimulation Parameters to tolerance   Electrical Stimulation Goals Pain                  PT Short Term Goals - 04/22/16 1034      PT SHORT TERM GOAL #1   Title pt will be fitted for and able to safely use rolling walker to ambulate on level surfaces   Time 4   Period Weeks   Status Achieved     PT SHORT TERM GOAL #2   Title Pt will be  able to ambulate with least restrictive AD up and down stairs   Baseline only had to go up and down for home   Time 4   Period Weeks   Status Achieved     PT SHORT TERM GOAL #3   Title pt will be independent with initial HEP   Time 4   Period Weeks   Status Achieved           PT Long Term Goals - 04/22/16 1035      PT LONG TERM GOAL #1   Title independent with advanced HEP   Baseline patient feels like exercises are making knee pain worse   Time 6   Period Weeks   Status Not Met     PT LONG TERM GOAL #2   Title pt able to safely walk in the community on uneven pavement with least restrictive AD for at least 5 minutes at a time   Time 6   Period Weeks   Status Achieved     PT LONG TERM GOAL #3   Title pt improved Berg score to > or = 41/56 in order to demonstrate reduced risk of falls   Baseline pt states he is not able to perform due to pain today   Time 6   Period Weeks   Status Not Met     PT LONG TERM GOAL #4   Title Pt will be able to get in and out of car safely and independently for increased community activities.   Baseline patient reports he is not having any problems getting in and out of the car   Time 6   Period Weeks   Status Achieved               Plan - 04/24/16 1641    Clinical Impression Statement Pt demonstrates flexed posture during gait which increased and after walking 90 feet with RW.  Pt needs cues to keep walker at an appropriate distance and keep feet inside the walker.  Pt was able to walk >200 feet at good speed without any LOB.  Pt has achieved most of his goals but was not able to perform Merrilee Jansky today and he reports he is not doing his HEP due to pain.  Pt is happy with his current level is function and will be dischared today.   Rehab Potential Excellent   PT Treatment/Interventions ADLs/Self Care Home Management;Electrical Stimulation;Cryotherapy;Moist Heat;Traction;Ultrasound;Neuromuscular re-education;Gait training;Stair  training;Functional mobility training;Therapeutic activities;Therapeutic exercise;Patient/family education;Balance training;Manual techniques;Taping;Other (comment);Dry needling  Consulted and Agree with Plan of Care Patient      Patient will benefit from skilled therapeutic intervention in order to improve the following deficits and impairments:  Abnormal gait, Decreased activity tolerance, Decreased balance, Decreased mobility, Postural dysfunction, Improper body mechanics, Difficulty walking, Impaired perceived functional ability  Visit Diagnosis: Muscle weakness (generalized)  Repeated falls  Abnormal posture  Difficulty in walking, not elsewhere classified       G-Codes - May 15, 2016 1631    Functional Assessment Tool Used (Outpatient Only) Berg balance and clinical reasoning   Functional Limitation Mobility: Walking and moving around   Mobility: Walking and Moving Around Current Status [phone]) At least 60 percent but less than 80 percent impaired, limited or restricted   Mobility: Walking and Moving Around Goal Status [phone]) At least 40 percent but less than 60 percent impaired, limited or restricted   Mobility: Walking and Moving Around Discharge Status [phone]) At least 60 percent but less than 80 percent impaired, limited or restricted      Problem List Patient Active Problem List   Diagnosis Date Noted  . Cough 01/03/2016  . Syncope and collapse 11/08/2014  . Routine general medical examination at a health care facility 07/30/2014  . Syncope May 15, 2013  . PERIPHERAL VASCULAR DISEASE 07/05/2008  . BLADDER CANCER 07/14/2007  . ELEVATED PROSTATE SPECIFIC ANTIGEN 07/14/2007  . SPINAL STENOSIS 04/03/2007  . NON-HODGKIN'S LYMPHOMA, HX OF 04/03/2007  . Essential hypertension 11/21/2006  . GERD 11/21/2006  . BPH (benign prostatic hyperplasia) 11/21/2006  . INTERNAL HEMORRHOIDS 11/18/2006    Zannie Cove, PT May 15, 2016, 4:41 PM  [facility] [address], [place], [place], [address] Phone: [phone]   Fax:  [phone]  Name: David Howard MRN: [id number] Date of Birth: 08/22/1923  PHYSICAL THERAPY DISCHARGE SUMMARY  Visits from Start of Care: 8  Current functional level related to goals / functional outcomes: See above details   Remaining deficits: See above details   Education / Equipment: HEP Plan: Patient agrees to discharge.  Patient goals were partially met. Patient is being discharged due to being pleased with the current functional level.  ?????     [employer], PT 05-15-2016 4:41 PM

## 2016-04-27 ENCOUNTER — Encounter: Payer: Medicare Other | Admitting: Physical Therapy

## 2016-04-29 ENCOUNTER — Encounter: Payer: Medicare Other | Admitting: Physical Therapy

## 2016-05-04 ENCOUNTER — Encounter: Payer: Medicare Other | Admitting: Physical Therapy

## 2016-05-05 DIAGNOSIS — N401 Enlarged prostate with lower urinary tract symptoms: Secondary | ICD-10-CM | POA: Diagnosis not present

## 2016-05-08 ENCOUNTER — Encounter (HOSPITAL_COMMUNITY): Payer: Self-pay | Admitting: Emergency Medicine

## 2016-05-08 ENCOUNTER — Emergency Department (HOSPITAL_COMMUNITY): Payer: Medicare Other

## 2016-05-08 ENCOUNTER — Emergency Department (HOSPITAL_COMMUNITY)
Admission: EM | Admit: 2016-05-08 | Discharge: 2016-05-08 | Disposition: A | Discharge status: Home / Self Care | Payer: Medicare Other | Attending: Emergency Medicine | Admitting: Emergency Medicine

## 2016-05-08 DIAGNOSIS — Z87891 Personal history of nicotine dependence: Secondary | ICD-10-CM | POA: Diagnosis not present

## 2016-05-08 DIAGNOSIS — Z23 Encounter for immunization: Secondary | ICD-10-CM | POA: Diagnosis not present

## 2016-05-08 DIAGNOSIS — S0990XA Unspecified injury of head, initial encounter: Secondary | ICD-10-CM | POA: Insufficient documentation

## 2016-05-08 DIAGNOSIS — I10 Essential (primary) hypertension: Secondary | ICD-10-CM | POA: Diagnosis not present

## 2016-05-08 DIAGNOSIS — S0181XA Laceration without foreign body of other part of head, initial encounter: Secondary | ICD-10-CM | POA: Insufficient documentation

## 2016-05-08 DIAGNOSIS — W228XXA Striking against or struck by other objects, initial encounter: Secondary | ICD-10-CM | POA: Insufficient documentation

## 2016-05-08 DIAGNOSIS — Y939 Activity, unspecified: Secondary | ICD-10-CM | POA: Insufficient documentation

## 2016-05-08 DIAGNOSIS — Z7982 Long term (current) use of aspirin: Secondary | ICD-10-CM | POA: Insufficient documentation

## 2016-05-08 DIAGNOSIS — Z8551 Personal history of malignant neoplasm of bladder: Secondary | ICD-10-CM | POA: Insufficient documentation

## 2016-05-08 DIAGNOSIS — S0180XA Unspecified open wound of other part of head, initial encounter: Secondary | ICD-10-CM | POA: Diagnosis not present

## 2016-05-08 DIAGNOSIS — S199XXA Unspecified injury of neck, initial encounter: Secondary | ICD-10-CM | POA: Diagnosis not present

## 2016-05-08 DIAGNOSIS — Y92002 Bathroom of unspecified non-institutional (private) residence single-family (private) house as the place of occurrence of the external cause: Secondary | ICD-10-CM | POA: Diagnosis not present

## 2016-05-08 DIAGNOSIS — Z79899 Other long term (current) drug therapy: Secondary | ICD-10-CM | POA: Insufficient documentation

## 2016-05-08 DIAGNOSIS — W19XXXA Unspecified fall, initial encounter: Secondary | ICD-10-CM

## 2016-05-08 DIAGNOSIS — R531 Weakness: Secondary | ICD-10-CM | POA: Diagnosis not present

## 2016-05-08 DIAGNOSIS — Y999 Unspecified external cause status: Secondary | ICD-10-CM | POA: Insufficient documentation

## 2016-05-08 DIAGNOSIS — S098XXA Other specified injuries of head, initial encounter: Secondary | ICD-10-CM | POA: Diagnosis not present

## 2016-05-08 LAB — COMPREHENSIVE METABOLIC PANEL
ALBUMIN: 3.8 g/dL (ref 3.5–5.0)
ALT: 14 U/L — ABNORMAL LOW (ref 17–63)
ANION GAP: 7 (ref 5–15)
AST: 21 U/L (ref 15–41)
Alkaline Phosphatase: 37 U/L — ABNORMAL LOW (ref 38–126)
BILIRUBIN TOTAL: 1.1 mg/dL (ref 0.3–1.2)
BUN: 23 mg/dL — AB (ref 6–20)
CALCIUM: 9 mg/dL (ref 8.9–10.3)
CO2: 27 mmol/L (ref 22–32)
Chloride: 100 mmol/L — ABNORMAL LOW (ref 101–111)
Creatinine, Ser: 1.21 mg/dL (ref 0.61–1.24)
GFR calc Af Amer: 59 mL/min — ABNORMAL LOW (ref >60)
GFR calc non Af Amer: 51 mL/min — ABNORMAL LOW (ref >60)
GLUCOSE: 147 mg/dL — AB (ref 65–99)
Potassium: 4 mmol/L (ref 3.5–5.1)
Sodium: 134 mmol/L — ABNORMAL LOW (ref 135–145)
TOTAL PROTEIN: 6.4 g/dL — AB (ref 6.5–8.1)

## 2016-05-08 LAB — CBC WITH DIFFERENTIAL/PLATELET
BASOS PCT: 0 %
Basophils Absolute: 0 10*3/uL (ref 0.0–0.1)
Eosinophils Absolute: 0.2 10*3/uL (ref 0.0–0.7)
Eosinophils Relative: 3 %
HEMATOCRIT: 37.9 % — AB (ref 39.0–52.0)
HEMOGLOBIN: 13.3 g/dL (ref 13.0–17.0)
LYMPHS ABS: 1 10*3/uL (ref 0.7–4.0)
LYMPHS PCT: 15 %
MCH: 30.1 pg (ref 26.0–34.0)
MCHC: 35.1 g/dL (ref 30.0–36.0)
MCV: 85.7 fL (ref 78.0–100.0)
MONO ABS: 0.3 10*3/uL (ref 0.1–1.0)
MONOS PCT: 5 %
NEUTROS ABS: 5.1 10*3/uL (ref 1.7–7.7)
NEUTROS PCT: 77 %
Platelets: 121 10*3/uL — ABNORMAL LOW (ref 150–400)
RBC: 4.42 MIL/uL (ref 4.22–5.81)
RDW: 12.9 % (ref 11.5–15.5)
WBC: 6.7 10*3/uL (ref 4.0–10.5)

## 2016-05-08 LAB — URINALYSIS, ROUTINE W REFLEX MICROSCOPIC
Bilirubin Urine: NEGATIVE
GLUCOSE, UA: NEGATIVE mg/dL
HGB URINE DIPSTICK: NEGATIVE
Ketones, ur: 5 mg/dL — AB
LEUKOCYTES UA: NEGATIVE
Nitrite: NEGATIVE
Protein, ur: NEGATIVE mg/dL
Specific Gravity, Urine: 1.015 (ref 1.005–1.030)
pH: 6 (ref 5.0–8.0)

## 2016-05-08 MED ORDER — LIDOCAINE-EPINEPHRINE (PF) 2 %-1:200000 IJ SOLN
10.0000 mL | Freq: Once | INTRAMUSCULAR | Status: AC
  Administered 2016-05-08: 10 mL
  Filled 2016-05-08: qty 20

## 2016-05-08 MED ORDER — TETANUS-DIPHTH-ACELL PERTUSSIS 5-2.5-18.5 LF-MCG/0.5 IM SUSP
0.5000 mL | Freq: Once | INTRAMUSCULAR | Status: AC
  Administered 2016-05-08: 0.5 mL via INTRAMUSCULAR
  Filled 2016-05-08: qty 0.5

## 2016-05-08 NOTE — ED Triage Notes (Signed)
Per EMS, patient felt weak while taking a shower. Patient sat down, then upon rising, patient fell to the floor. Lacerations noted to left forehead, left hand, and left elbow. Patient is alert and oriented. Denies loss of consciousness or blood thinner use. Patient has chronic neck/back pain. Patient does not reports any new pain to the area. Patient is from home.

## 2016-05-08 NOTE — ED Notes (Signed)
Patient transported to CT 

## 2016-05-08 NOTE — ED Notes (Signed)
Discharge instructions and follow up care reviewed with patient. Patient verbalized understanding. 

## 2016-05-08 NOTE — ED Notes (Signed)
Bed: WA03 Expected date:  Expected time:  Means of arrival:  Comments: EMS/Fall

## 2016-05-08 NOTE — ED Notes (Signed)
ED Provider at bedside. 

## 2016-05-08 NOTE — Discharge Instructions (Signed)
Keep wounds dry for first day. Then clean/shower as needed without scrubbing 1-2 times daily and dress with neosporin.  See your PCP for close follow-up. Sutures need to be taken out by PCP in 7 days.   Return for worsening symptoms, including passing out, difficulty breathing, confusion, worsening weakness, fevers or any other symptoms concerning to you.

## 2016-05-08 NOTE — ED Notes (Signed)
Patient able to stand and ambulated in room without difficulty.

## 2016-05-08 NOTE — ED Notes (Signed)
EKG completed by Herbie Baltimore, NT

## 2016-05-08 NOTE — ED Provider Notes (Signed)
[place] DEPT Provider Note   CSN: [id number] Arrival date & time: 05/08/16  0755     History   Chief Complaint Chief Complaint  Patient presents with  . Fall  . Laceration    HPI David Howard is a 83 y.o. male.  The history is provided by the patient.  Fall  This is a new problem. The current episode started 1 to 2 hours ago. The problem occurs rarely. The problem has not changed since onset.Pertinent negatives include no chest pain, no abdominal pain, no headaches and no shortness of breath. Nothing aggravates the symptoms. Nothing relieves the symptoms. He has tried nothing for the symptoms.   83 year old male who presents with fall. History of BPH. No blood thinners. States he has not been sleeping well recently. This morning, taking a shower and felt weak coming out of the shower. Tried to sit down on the commode, states he lost balance and fell forward and hit his head on the bathtub edge. He denies LOC. Denies feeling dizzy or lightheaded prior. Denies any recent illnesses, including nausea or vomiting, diarrhea, fevers or chills, cough, difficulty breathing, chest pains. Sustained laceration to the left arm and hand, and laceration to the forehead. Denies any headaches, vision changes, speech changes, confusion, focal numbness or weakness, back pain or neck pain, abdominal pain, chest pain or other extremity injuries.  Past Medical History:  Diagnosis Date  . Benign neoplasm of colon   . Diverticulosis of colon (without mention of hemorrhage)   . Elevated prostate specific antigen (PSA)    BPH, follows with uro  . Esophageal reflux   . Hearing loss    L>R, follows with ENT, wears hearing aides  . Inguinal hernia without mention of obstruction or gangrene, unilateral or unspecified, (not specified as recurrent)   . Internal hemorrhoids without mention of complication   . Malignant neoplasm of bladder, part unspecified   . Other abnormal glucose   . Peripheral  vascular disease, unspecified ([place])   . Personal history of other lymphatic and hematopoietic neoplasm 2003   Primary cutaneous B-cell lymphoma, s/p xrt  . Spinal stenosis, unspecified region other than cervical    R>L LE pain  . Unspecified essential hypertension     Patient Active Problem List   Diagnosis Date Noted  . Cough 01/03/2016  . Syncope and collapse 11/08/2014  . Routine general medical examination at a health care facility 07/30/2014  . Syncope 04/24/2013  . PERIPHERAL VASCULAR DISEASE 07/05/2008  . BLADDER CANCER 07/14/2007  . ELEVATED PROSTATE SPECIFIC ANTIGEN 07/14/2007  . SPINAL STENOSIS 04/03/2007  . NON-HODGKIN'S LYMPHOMA, HX OF 04/03/2007  . Essential hypertension 11/21/2006  . GERD 11/21/2006  . BPH (benign prostatic hyperplasia) 11/21/2006  . INTERNAL HEMORRHOIDS 11/18/2006    Past Surgical History:  Procedure Laterality Date  . CATARACT EXTRACTION    . LAPAROSCOPY    . PILONIDAL CYST / SINUS EXCISION    . TONSILLECTOMY         Home Medications    Prior to Admission medications   Medication Sig Start Date End Date Taking? Authorizing Provider  aspirin 81 MG tablet Take 81 mg by mouth every other day.     Historical Provider, MD  Calcium Carbonate-Vitamin D (CALCIUM-VITAMIN D) 500-200 MG-UNIT per tablet Take 1 tablet by mouth 2 (two) times daily with a meal.     Historical Provider, MD  Cholecalciferol (VITAMIN D3) 1000 UNITS CAPS Take 1 capsule by mouth daily.    Historical  Provider, MD  doxazosin (CARDURA) 2 MG tablet Take 1 tablet (2 mg total) by mouth at bedtime. 03/17/16   [name], MD  finasteride (PROSCAR) 5 MG tablet TAKE 1 TABLET ONCE DAILY. 03/18/16   [name], MD  lisinopril (PRINIVIL,ZESTRIL) 20 MG tablet TAKE (1) TABLET DAILY FOR HIGH BLOOD PRESSURE. 03/18/16   [name], MD  Multiple Vitamin (MULTIVITAMIN) capsule Take 1 capsule by mouth daily.      Historical Provider, MD  Omega-3 Fatty Acids (FISH OIL) 1000 MG  CAPS Take 1 capsule by mouth every other day.     Historical Provider, MD  psyllium (HYDROCIL/METAMUCIL) 95 % PACK Take 1 packet by mouth daily.    Historical Provider, MD    Family History Family History  Problem Relation Age of Onset  . Kidney disease Father     Social History Social History  Substance Use Topics  . Smoking status: Former Smoker    Types: Cigarettes  . Smokeless tobacco: Never Used  . Alcohol use Yes     Comment: social use     Allergies   Patient has no known allergies.   Review of Systems Review of Systems  Constitutional: Negative for fever.  HENT: Negative for congestion.   Respiratory: Negative for cough and shortness of breath.   Cardiovascular: Negative for chest pain.  Gastrointestinal: Negative for abdominal pain.  Genitourinary: Negative for difficulty urinating and dysuria.  Musculoskeletal: Negative for back pain and neck pain.  Skin: Positive for wound.  Allergic/Immunologic: Negative for immunocompromised state.  Neurological: Negative for headaches.  Hematological: Does not bruise/bleed easily.  Psychiatric/Behavioral: Negative for confusion.  All other systems reviewed and are negative.    Physical Exam Updated Vital Signs BP (!) 152/86 (BP Location: Right Arm)   Pulse 77   Temp 97.4 F (36.3 C) (Oral)   Resp 16   Ht 5\' 8"  (1.727 m)   Wt 189 lb (85.7 kg)   SpO2 96%   BMI 28.74 kg/m   Physical Exam Physical Exam  Nursing note and vitals reviewed. Constitutional: Well developed, well nourished, non-toxic, and in no acute distress Head: Normocephalic and atraumatic.  Mouth/Throat: Oropharynx is clear and moist.  Neck: Normal range of motion. Neck supple. no cervical spine tenderness Cardiovascular: Normal rate and regular rhythm.   Pulmonary/Chest: Effort normal and breath sounds normal.  Abdominal: Soft. There is no tenderness. There is no rebound and no guarding.  Musculoskeletal: Normal range of motion. no CTLS  tenderness Neurological: Alert, no facial droop, fluent speech, moves all extremities symmetrically, sensation to light touch in tact throughout, EOMI, PERRL Skin: Skin is warm and dry. Skin tear over dorsum of the left hand, and left forearm Psychiatric: Cooperative   ED Treatments / Results  Labs (all labs ordered are listed, but only abnormal results are displayed) Labs Reviewed  CBC WITH DIFFERENTIAL/PLATELET - Abnormal; Notable for the following:       Result Value   HCT 37.9 (*)    Platelets 121 (*)    All other components within normal limits  COMPREHENSIVE METABOLIC PANEL - Abnormal; Notable for the following:    Sodium 134 (*)    Chloride 100 (*)    Glucose, Bld 147 (*)    BUN 23 (*)    Total Protein 6.4 (*)    ALT 14 (*)    Alkaline Phosphatase 37 (*)    GFR calc non Af Amer 51 (*)    GFR calc Af Amer 59 (*)  All other components within normal limits  URINALYSIS, ROUTINE W REFLEX MICROSCOPIC - Abnormal; Notable for the following:    Ketones, ur 5 (*)    All other components within normal limits    EKG  EKG Interpretation None       Radiology Dg Chest 2 View  Result Date: 05/08/2016 CLINICAL DATA:  Weakness since this morning, collapsed and head head this morning. EXAM: CHEST  2 VIEW COMPARISON:  Chest x-ray dated 07/14/2007. FINDINGS: Heart size is upper normal, grossly stable compared to the earlier exam. Overall cardiomediastinal silhouette is within normal limits, given the slightly lordotic patient positioning. Elevation of the left hemidiaphragm, likely chronic. Lungs are clear. No pleural effusion or pneumothorax seen. No acute or suspicious osseous finding. IMPRESSION: No active cardiopulmonary disease. No evidence of pneumonia or pulmonary edema. Electronically Signed   By: Franki Cabot M.D.   On: 05/08/2016 09:20   Ct Head Wo Contrast  Result Date: 05/08/2016 CLINICAL DATA:  Generalized weakness. Fell in bathroom striking head. Initial encounter.  EXAM: CT HEAD WITHOUT CONTRAST CT CERVICAL SPINE WITHOUT CONTRAST TECHNIQUE: Multidetector CT imaging of the head and cervical spine was performed following the standard protocol without intravenous contrast. Multiplanar CT image reconstructions of the cervical spine were also generated. COMPARISON:  10/31/2014 FINDINGS: CT HEAD FINDINGS Brain: There is no evidence of acute cortical infarct, intracranial hemorrhage, mass, midline shift, or extra-axial fluid collection. Cerebral atrophy is unchanged and likely within normal limits for age. Periventricular white matter hypodensities are unchanged and nonspecific but compatible with minimal chronic small vessel ischemic disease. Vascular: Calcified atherosclerosis at the skullbase. No hyperdense vessel. Skull: No fracture or focal osseous lesion. Sinuses/Orbits: Mild left greater than right sphenoid sinus mucosal thickening. Bilateral cataract extraction. Other: Left frontal scalp laceration with mild swelling. CT CERVICAL SPINE FINDINGS Alignment: Reversal of the normal cervical lordosis. Unchanged grade 1 anterolisthesis of C4 on C5. Right convex cervical spine curvature. Skull base and vertebrae: A chronic nonunited type 2 dens fracture is unchanged, with the odontoid fragment again being displaced approximately 3 mm posterior relative to the C2 vertebral body. No acute fracture is identified. Soft tissues and spinal canal: No prevertebral fluid or swelling. No visible canal hematoma. Disc levels: Similar appearance of advanced disc degeneration throughout the cervical spine and in the upper thoracic spine with exception of C2-3. Advanced multilevel cervical facet arthrosis is greater on the left. There is multilevel facet ankylosis. Upper chest: Grossly clear lung apices. Other: Asymmetric left carotid artery calcified atherosclerosis. IMPRESSION: 1. No evidence of acute intracranial abnormality. 2. No acute cervical spine fracture identified. Unchanged chronic  nonunited type 2 dens fracture. Electronically Signed   By: Logan Bores M.D.   On: 05/08/2016 09:23   Ct Cervical Spine Wo Contrast  Result Date: 05/08/2016 CLINICAL DATA:  Generalized weakness. Fell in bathroom striking head. Initial encounter. EXAM: CT HEAD WITHOUT CONTRAST CT CERVICAL SPINE WITHOUT CONTRAST TECHNIQUE: Multidetector CT imaging of the head and cervical spine was performed following the standard protocol without intravenous contrast. Multiplanar CT image reconstructions of the cervical spine were also generated. COMPARISON:  10/31/2014 FINDINGS: CT HEAD FINDINGS Brain: There is no evidence of acute cortical infarct, intracranial hemorrhage, mass, midline shift, or extra-axial fluid collection. Cerebral atrophy is unchanged and likely within normal limits for age. Periventricular white matter hypodensities are unchanged and nonspecific but compatible with minimal chronic small vessel ischemic disease. Vascular: Calcified atherosclerosis at the skullbase. No hyperdense vessel. Skull: No fracture or focal osseous lesion.  Sinuses/Orbits: Mild left greater than right sphenoid sinus mucosal thickening. Bilateral cataract extraction. Other: Left frontal scalp laceration with mild swelling. CT CERVICAL SPINE FINDINGS Alignment: Reversal of the normal cervical lordosis. Unchanged grade 1 anterolisthesis of C4 on C5. Right convex cervical spine curvature. Skull base and vertebrae: A chronic nonunited type 2 dens fracture is unchanged, with the odontoid fragment again being displaced approximately 3 mm posterior relative to the C2 vertebral body. No acute fracture is identified. Soft tissues and spinal canal: No prevertebral fluid or swelling. No visible canal hematoma. Disc levels: Similar appearance of advanced disc degeneration throughout the cervical spine and in the upper thoracic spine with exception of C2-3. Advanced multilevel cervical facet arthrosis is greater on the left. There is multilevel  facet ankylosis. Upper chest: Grossly clear lung apices. Other: Asymmetric left carotid artery calcified atherosclerosis. IMPRESSION: 1. No evidence of acute intracranial abnormality. 2. No acute cervical spine fracture identified. Unchanged chronic nonunited type 2 dens fracture. Electronically Signed   By: Logan Bores M.D.   On: 05/08/2016 09:23    Procedures .Marland KitchenLaceration Repair Date/Time: 05/08/2016 10:34 AM Performed by: Brantley Stage DUO Authorized by: Brantley Stage DUO   Consent:    Consent obtained:  Verbal   Risks discussed:  Infection, pain, poor cosmetic result and poor wound healing   Alternatives discussed:  No treatment Anesthesia (see MAR for exact dosages):    Anesthesia method:  Local infiltration   Local anesthetic:  Lidocaine 2% WITH epi Laceration details:    Location: hairline.   Length (cm):  3 Repair type:    Repair type:  Simple Pre-procedure details:    Preparation:  Patient was prepped and draped in usual sterile fashion Exploration:    Hemostasis achieved with:  Direct pressure   Wound exploration: entire depth of wound probed and visualized     Contaminated: no   Treatment:    Area cleansed with:  Betadine   Amount of cleaning:  Standard   Irrigation solution:  Tap water   Irrigation volume:  500 mL   Irrigation method:  Syringe Skin repair:    Repair method:  Sutures   Suture size:  5-0   Suture material:  Prolene   Suture technique:  Simple interrupted   Number of sutures:  3 Approximation:    Approximation:  Close Post-procedure details:    Dressing:  Antibiotic ointment and non-adherent dressing   Patient tolerance of procedure:  Tolerated well, no immediate complications   (including critical care time)  Medications Ordered in ED Medications  Tdap (BOOSTRIX) injection 0.5 mL (0.5 mLs Intramuscular Given 05/08/16 0844)  lidocaine-EPINEPHrine (XYLOCAINE W/EPI) 2 %-1:200000 (PF) injection 10 mL (10 mLs Infiltration Given by Other 05/08/16 0258)      Initial Impression / Assessment and Plan / ED Course  I have reviewed the triage vital signs and the nursing notes.  Pertinent labs & imaging results that were available during my care of the patient were reviewed by me and considered in my medical decision making (see chart for details).     With fall from commode this morning after getting out of the shower. No neuro deficits. He denies LOC or syncope. Laceration to hairline. Skin tears over LUE and hand. Wounds cleaned. Laceration repaired. Ct head and cervical Spine visualized and did not show any acute traumatic injuries. Blood work is reassuring. No pneumonia on chest x-ray visualization or any other acute cardiopulmonary processes. Urinalysis is normal. Ambulated and steady in the ED without any  difficulty. He is felt to be stable for discharge home. Wound care instructions reviewed. Strict return and follow-up instructions reviewed. He expressed understanding of all discharge instructions and felt comfortable with the plan of care.   Final Clinical Impressions(s) / ED Diagnoses   Final diagnoses:  Facial laceration, initial encounter  Fall, initial encounter    New Prescriptions New Prescriptions   No medications on file     Forde Dandy, MD 05/08/16 1036

## 2016-05-10 ENCOUNTER — Telehealth: Payer: Self-pay | Admitting: Family Medicine

## 2016-05-10 NOTE — Telephone Encounter (Signed)
Error

## 2016-05-11 ENCOUNTER — Encounter: Payer: Self-pay | Admitting: Family Medicine

## 2016-05-11 ENCOUNTER — Ambulatory Visit (INDEPENDENT_AMBULATORY_CARE_PROVIDER_SITE_OTHER): Payer: Medicare Other | Admitting: Family Medicine

## 2016-05-11 VITALS — BP 128/76 | HR 74 | Temp 97.6°F | Wt 184.2 lb

## 2016-05-11 DIAGNOSIS — N400 Enlarged prostate without lower urinary tract symptoms: Secondary | ICD-10-CM

## 2016-05-11 DIAGNOSIS — T07XXXA Unspecified multiple injuries, initial encounter: Secondary | ICD-10-CM

## 2016-05-11 DIAGNOSIS — R55 Syncope and collapse: Secondary | ICD-10-CM | POA: Diagnosis not present

## 2016-05-11 NOTE — Progress Notes (Signed)
Subjective:     Patient ID: David Howard, male   DOB: 05/21/1923, 83 y.o.   MRN: [id number]  HPI Patient is here following ER visit Saturday following possible syncopal episode with fall and head injury and multiple lacerations. Patient got up Saturday morning and took his shower as usual. He recalls feeling slightly dizzy and lightheaded when he got out of the shower and he went to sit on the commode and when sitting apparently became even more dizzy and lost his balance and fell forward. He struck his head against bathtub tile.  His wife heard the commotion and when she got there he was lying face down in blood and appeared to be unconscious briefly. She called 911 and when she came back he was moving around. No history of seizure. He had very similar episode back in 2016. He denies any chest pain or palpitations.  Patient was taken to come in ER for further evaluation. He had chest x-ray unremarkable. EKG no acute abnormalities. Blood work unremarkable. CT head and neck no acute abnormalities. He had evidence for chronic nonunited type II dens fracture probably from previous fall. Patient had lacerations including frontal area, left hand, and left forearm. He had a couple sutures placed to the scalp laceration but very fragile skin on his left upper extremity.  No headaches since the fall. Denies any lightheadedness. Try to be more conscious to drink fluids in the morning. His other risk factors for orthostasis include that he uses finasteride and Cardura for BPH. He is followed by urology for that.  Pt had very similar occurrence back in 2016 with fall after getting out of shower.  Family is concerned about difficulty with changing his dressings and requesting home health care.  Past Medical History:  Diagnosis Date  . Benign neoplasm of colon   . Diverticulosis of colon (without mention of hemorrhage)   . Elevated prostate specific antigen (PSA)    BPH, follows with uro  . Esophageal reflux    . Hearing loss    L>R, follows with ENT, wears hearing aides  . Inguinal hernia without mention of obstruction or gangrene, unilateral or unspecified, (not specified as recurrent)   . Internal hemorrhoids without mention of complication   . Malignant neoplasm of bladder, part unspecified   . Other abnormal glucose   . Peripheral vascular disease, unspecified ([place])   . Personal history of other lymphatic and hematopoietic neoplasm 2003   Primary cutaneous B-cell lymphoma, s/p xrt  . Spinal stenosis, unspecified region other than cervical    R>L LE pain  . Unspecified essential hypertension    Past Surgical History:  Procedure Laterality Date  . CATARACT EXTRACTION    . LAPAROSCOPY    . PILONIDAL CYST / SINUS EXCISION    . TONSILLECTOMY      reports that he has quit smoking. His smoking use included Cigarettes. He has never used smokeless tobacco. He reports that he drinks alcohol. He reports that he does not use drugs. family history includes Kidney disease in his father. No Known Allergies'   Review of Systems  Constitutional: Negative for chills, fatigue and fever.  Eyes: Negative for visual disturbance.  Respiratory: Negative for cough, chest tightness and shortness of breath.   Cardiovascular: Negative for chest pain, palpitations and leg swelling.  Gastrointestinal: Negative for abdominal pain, nausea and vomiting.  Endocrine: Negative for polydipsia and polyuria.  Genitourinary: Negative for dysuria.  Neurological: Negative for dizziness, syncope, weakness, light-headedness and headaches.  Psychiatric/Behavioral: Negative  for confusion.       Objective:   Physical Exam  Constitutional: He is oriented to person, place, and time. He appears well-developed and well-nourished.  HENT:  Patient has evidence for laceration across the mid frontal area.  Significant amount of crusted blood. No signs of secondary infection. Sutures intact.  Cardiovascular: Normal rate and  regular rhythm.   Pulmonary/Chest: Effort normal and breath sounds normal. No respiratory distress. He has no wheezes. He has no rales.  Musculoskeletal: He exhibits no edema.  Neurological: He is alert and oriented to person, place, and time. No cranial nerve deficit.  Skin:  Patient has a large avulsion laceration dorsum left hand involving much of the surface of the left hand. Also has a couple of areas of avulsion laceration left proximal forearm dorsally. No signs of secondary infection.  Psychiatric: He has a normal mood and affect. His behavior is normal. Judgment and thought content normal.       Assessment:     Status post fall with multiple injuries including laceration of the frontal scalp, left hand, left forearm. No signs of secondary infection.  History of recurrent falls and probable syncope. Suspect combination of factors including dehydration, medication (Cardura and Finasteride), and patient just got out of the shower with likely vasodilation and transient drop in blood pressure    Plan:     -Wounds were cleaned today and redressed Continue with daily dressing changes -Set up home health nursing for wound assistance in dressing changes -Check with his urologist to see if they would be willing to take him off of Cardura -Start drinking water early in the morning -Make showers brief and avoid hot water -Follow-up here in one week to reassess wounds and suture removal -over 45 minutes spent with pt and his wife reviewing recent events, ER notes/studies, factors to reduce risk of recurrent fall/syncope, home health referral, and wound assessment. > 50 % spent in direct assessment and counseling with patient regarding the above.  [name] MD [facility] at [facility] with Dr Alinda Money (Urology) after pt left.  He concurs that we could try leaving off the Cardura.  IF he has increased BPH symptoms after d/c could try medication such as Rapaflo-though  still has some caution for orthostasis.  Pt has been notified to leave off the  Cardura.  [name] MD [place] [facility] at [facility]

## 2016-05-11 NOTE — Progress Notes (Signed)
Pre visit review using our clinic review tool, if applicable. No additional management support is needed unless otherwise documented below in the visit note. 

## 2016-05-11 NOTE — Patient Instructions (Addendum)
WE NOW OFFER   [facility] FAST TRACK!!!  SAME DAY Appointments for ACUTE CARE  Such as: Sprains, Injuries, cuts, abrasions, rashes, muscle pain, joint pain, back pain Colds, flu, sore throats, headache, allergies, cough, fever  Ear pain, sinus and eye infections Abdominal pain, nausea, vomiting, diarrhea, upset stomach Animal/insect bites  3 Easy Ways to Schedule: Walk-In Scheduling Call in scheduling Mychart Sign-up: [email or web address]  I have put in referral to home health to assist with wound dressing changes I am waiting on call back from Dr Alinda Money regarding Cardura (Doxazosin) Hydrate starting early in morning Keep showers brief and try to avoid extremely hot water (cooler showers if tolerated) Follow up here next week for suture removal and wound check. Follow up sooner for signs of secondary infection such as increased redness, drainage, warmth, or pain

## 2016-05-13 DIAGNOSIS — I10 Essential (primary) hypertension: Secondary | ICD-10-CM | POA: Diagnosis not present

## 2016-05-14 ENCOUNTER — Telehealth: Payer: Self-pay | Admitting: *Deleted

## 2016-05-14 DIAGNOSIS — I10 Essential (primary) hypertension: Secondary | ICD-10-CM | POA: Diagnosis not present

## 2016-05-14 NOTE — Telephone Encounter (Signed)
OK 

## 2016-05-14 NOTE — Telephone Encounter (Signed)
David Howard from [facility] would like to have an order for Xeroform instead of antibiotic dressing, because the wife is not comfortable with changing her husband's dressings.  Xeroform can be changed by home health every other day or three times a week.  Antibiotic dressing has to be changed daily.  Okay to give order?

## 2016-05-14 NOTE — Telephone Encounter (Signed)
Left message on machine for David Howard with verbal orders.  Also called [name] with the new orders.

## 2016-05-15 DIAGNOSIS — I10 Essential (primary) hypertension: Secondary | ICD-10-CM | POA: Diagnosis not present

## 2016-05-17 DIAGNOSIS — I10 Essential (primary) hypertension: Secondary | ICD-10-CM | POA: Diagnosis not present

## 2016-05-18 ENCOUNTER — Ambulatory Visit: Payer: Medicare Other | Admitting: Family Medicine

## 2016-05-18 ENCOUNTER — Ambulatory Visit (INDEPENDENT_AMBULATORY_CARE_PROVIDER_SITE_OTHER): Payer: Medicare Other | Admitting: Family Medicine

## 2016-05-18 ENCOUNTER — Encounter: Payer: Self-pay | Admitting: Family Medicine

## 2016-05-18 VITALS — BP 150/90 | HR 64 | Temp 98.1°F | Wt 183.7 lb

## 2016-05-18 DIAGNOSIS — T07XXXA Unspecified multiple injuries, initial encounter: Secondary | ICD-10-CM | POA: Diagnosis not present

## 2016-05-18 DIAGNOSIS — R739 Hyperglycemia, unspecified: Secondary | ICD-10-CM | POA: Diagnosis not present

## 2016-05-18 DIAGNOSIS — N400 Enlarged prostate without lower urinary tract symptoms: Secondary | ICD-10-CM

## 2016-05-18 LAB — GLUCOSE, POCT (MANUAL RESULT ENTRY): POC Glucose: 119 mg/dl — AB (ref 70–99)

## 2016-05-18 NOTE — Patient Instructions (Signed)
Hyperglycemia  Hyperglycemia occurs when the level of sugar (glucose) in the blood is too high. Glucose is a type of sugar that provides the body's main source of energy. Certain hormones (insulin and glucagon) control the level of glucose in the blood. Insulin lowers blood glucose, and glucagon increases blood glucose. Hyperglycemia can result from having too little insulin in the bloodstream, or from the body not responding normally to insulin.  Hyperglycemia occurs most often in people who have diabetes (diabetes mellitus), but it can happen in people who do not have diabetes. It can develop quickly, and it can be life-threatening if it causes you to become severely dehydrated (diabetic ketoacidosis or hyperglycemic hyperosmolar state). Severe hyperglycemia is a medical emergency.  What are the causes?  If you have diabetes, hyperglycemia may be caused by:  · Diabetes medicine.  · Medicines that increase blood glucose or affect your diabetes control.  · Not eating enough, or not eating often enough.  · Changes in physical activity level.  · Being sick or having an infection.    If you have prediabetes or undiagnosed diabetes:  · Hyperglycemia may be caused by those conditions.    If you do not have diabetes, hyperglycemia may be caused by:  · Certain medicines, including steroid medicines, beta-blockers, epinephrine, and thiazide diuretics.  · Stress.  · Serious illness.  · Surgery.  · Diseases of the pancreas.  · Infection.    What increases the risk?  Hyperglycemia is more likely to develop in people who have risk factors for diabetes, such as:  · Having a family member with diabetes.  · Having a gene for type 1 diabetes that is passed from parent to child (inherited).  · Living in an area with cold weather conditions.  · Exposure to certain viruses.  · Certain conditions in which the body's disease-fighting (immune) system attacks itself (autoimmune disorders).  · Being overweight or obese.  · Having an  inactive (sedentary) lifestyle.  · Having been diagnosed with insulin resistance.  · Having a history of prediabetes, gestational diabetes, or polycystic ovarian syndrome (PCOS).  · Being of American-Indian, African-American, Hispanic/Latino, or Asian/Pacific Islander descent.    What are the signs or symptoms?  Hyperglycemia may not cause any symptoms. If you do have symptoms, they may include early warning signs, such as:  · Increased thirst.  · Hunger.  · Feeling very tired.  · Needing to urinate more often than usual.  · Blurry vision.    Other symptoms may develop if hyperglycemia gets worse, such as:  · Dry mouth.  · Loss of appetite.  · Fruity-smelling breath.  · Weakness.  · Unexpected or rapid weight gain or weight loss.  · Tingling or numbness in the hands or feet.  · Headache.  · Skin that does not quickly return to normal after being lightly pinched and released (poor skin turgor).  · Abdominal pain.  · Cuts or bruises that are slow to heal.    How is this diagnosed?  Hyperglycemia is diagnosed with a blood test to measure your blood glucose level. This blood test is usually done while you are having symptoms. Your health care provider may also do a physical exam and review your medical history.  You may have more tests to determine the cause of your hyperglycemia, such as:  · A fasting blood glucose (FBG) test. You will not be allowed to eat (you will fast) for at least 8 hours before a blood sample is   taken.  · An A1c (hemoglobin A1c) blood test. This provides information about blood glucose control over the previous 2-3 months.  · An oral glucose tolerance test (OGTT). This measures your blood glucose at two times:  ? After fasting. This is your baseline blood glucose level.  ? Two hours after drinking a beverage that contains glucose.    How is this treated?  Treatment depends on the cause of your hyperglycemia. Treatment may include:  · Taking medicine to regulate your blood glucose levels. If you  take insulin or other diabetes medicines, your medicine or dosage may be adjusted.  · Lifestyle changes, such as exercising more, eating healthier foods, or losing weight.  · Treating an illness or infection, if this caused your hyperglycemia.  · Checking your blood glucose more often.  · Stopping or reducing steroid medicines, if these caused your hyperglycemia.    If your hyperglycemia becomes severe and it results in hyperglycemic hyperosmolar state, you must be hospitalized and given IV fluids.  Follow these instructions at home:  General instructions   · Take over-the-counter and prescription medicines only as told by your health care provider.  · Do not use any products that contain nicotine or tobacco, such as cigarettes and e-cigarettes. If you need help quitting, ask your health care provider.  · Limit alcohol intake to no more than 1 drink per day for nonpregnant women and 2 drinks per day for men. One drink equals 12 oz of beer, 5 oz of wine, or 1½ oz of hard liquor.  · Learn to manage stress. If you need help with this, ask your health care provider.  · Keep all follow-up visits as told by your health care provider. This is important.  Eating and drinking   · Maintain a healthy weight.  · Exercise regularly, as directed by your health care provider.  · Stay hydrated, especially when you exercise, get sick, or spend time in hot temperatures.  · Eat healthy foods, such as:  ? Lean proteins.  ? Complex carbohydrates.  ? Fresh fruits and vegetables.  ? Low-fat dairy products.  ? Healthy fats.  · Drink enough fluid to keep your urine clear or pale yellow.  If you have diabetes:     · Make sure you know the symptoms of hyperglycemia.  · Follow your diabetes management plan, as told by your health care provider. Make sure you:  ? Take your insulin and medicines as directed.  ? Follow your exercise plan.  ? Follow your meal plan. Eat on time, and do not skip meals.  ? Check your blood glucose as often as  directed. Make sure to check your blood glucose before and after exercise. If you exercise longer or in a different way than usual, check your blood glucose more often.  ? Follow your sick day plan whenever you cannot eat or drink normally. Make this plan in advance with your health care provider.  · Share your diabetes management plan with people in your workplace, school, and household.  · Check your urine for ketones when you are ill and as told by your health care provider.  · Carry a medical alert card or wear medical alert jewelry.  Contact a health care provider if:  · Your blood glucose is at or above 240 mg/dL (13.3 mmol/L) for 2 days in a row.  · You have problems keeping your blood glucose in your target range.  · You have frequent episodes of hyperglycemia.    Get help right away if:  · You have difficulty breathing.  · You have a change in how you think, feel, or act (mental status).  · You have nausea or vomiting that does not go away.  These symptoms may represent a serious problem that is an emergency. Do not wait to see if the symptoms will go away. Get medical help right away. Call your local emergency services (911 in the [place] Do not drive yourself to the hospital.  Summary  · Hyperglycemia occurs when the level of sugar (glucose) in the blood is too high.  · Hyperglycemia is diagnosed with a blood test to measure your blood glucose level. This blood test is usually done while you are having symptoms. Your health care provider may also do a physical exam and review your medical history.  · If you have diabetes, follow your diabetes management plan as told by your health care provider.  · Contact your health care provider if you have problems keeping your blood glucose in your target range.  This information is not intended to replace advice given to you by your health care provider. Make sure you discuss any questions you have with your health care provider.  Document Released: 06/30/2000 Document  Revised: 09/22/2015 Document Reviewed: 09/22/2015  Elsevier Interactive Patient Education © 2017 [employer]

## 2016-05-18 NOTE — Progress Notes (Signed)
Subjective:     Patient ID: David Howard, male   DOB: [birth year], 83 y.o.   MRN: [id number]  HPI Patient here following recent fall and possible brief syncopal episode or near syncopal episode. He had multiple wounds including left frontal laceration along with avulsion injuries of the left hand and left elbow region. Home health nursing coming out every other day. His wounds are healing well. No headaches. No dizziness. No recurrent syncope. No recent chest pain.  We had called his urologist and gotten approval to discontinue his Cardura. He has not noted any reduction in urine flow. Wife is trying to make sure he is getting more hydration..  He has some ongoing nocturia which is chronic and unchanged.  Glucose in ER 147- but non-fasting.  No hx of diabetes.  Past Medical History:  Diagnosis Date  . Benign neoplasm of colon   . Diverticulosis of colon (without mention of hemorrhage)   . Elevated prostate specific antigen (PSA)    BPH, follows with uro  . Esophageal reflux   . Hearing loss    L>R, follows with ENT, wears hearing aides  . Inguinal hernia without mention of obstruction or gangrene, unilateral or unspecified, (not specified as recurrent)   . Internal hemorrhoids without mention of complication   . Malignant neoplasm of bladder, part unspecified   . Other abnormal glucose   . Peripheral vascular disease, unspecified ([place])   . Personal history of other lymphatic and hematopoietic neoplasm 2003   Primary cutaneous B-cell lymphoma, s/p xrt  . Spinal stenosis, unspecified region other than cervical    R>L LE pain  . Unspecified essential hypertension    Past Surgical History:  Procedure Laterality Date  . CATARACT EXTRACTION    . LAPAROSCOPY    . PILONIDAL CYST / SINUS EXCISION    . TONSILLECTOMY      reports that he has quit smoking. His smoking use included Cigarettes. He has never used smokeless tobacco. He reports that he drinks alcohol. He reports that he does not  use drugs. family history includes Kidney disease in his father. No Known Allergies\   Review of Systems  Constitutional: Negative for fatigue.  Eyes: Negative for visual disturbance.  Respiratory: Negative for cough, chest tightness and shortness of breath.   Cardiovascular: Negative for chest pain, palpitations and leg swelling.  Neurological: Negative for dizziness, syncope, weakness, light-headedness and headaches.       Objective:   Physical Exam  Constitutional: He is oriented to person, place, and time. He appears well-developed and well-nourished.  HENT:  Right Ear: External ear normal.  Left Ear: External ear normal.  Mouth/Throat: Oropharynx is clear and moist.  Eyes: Pupils are equal, round, and reactive to light.  Neck: Neck supple. No thyromegaly present.  Cardiovascular: Normal rate and regular rhythm.   Pulmonary/Chest: Effort normal and breath sounds normal. No respiratory distress. He has no wheezes. He has no rales.  Musculoskeletal: He exhibits no edema.  Neurological: He is alert and oriented to person, place, and time.  Skin:  Patient has wound left frontal region with large eschar. 3 sutures removed. Healing well with no signs of secondary infection. Bandages removed from left hand and left elbow avulsion injuries. These are healing very well with no signs of secondary infection       Assessment:     #1 Multiple lacerations following recent fall doing well with no signs of infection.  #2 Recent near syncopal episode as above  #3 BPH symptomatically  stable after discontinuation of Cardura.  #4 hyperglycemia.  4 hour PP glucose 119.      Plan:     -Continue improved hydration and continue to hold Cardura -Change positions slowly -Reassess in 1-2 weeks -sutures removed -wounds re-dressed. -reduce sugar and starch intake.  Consider A1C at follow up.  [name] MD [facility] at [facility]

## 2016-05-18 NOTE — Progress Notes (Signed)
Pre visit review using our clinic review tool, if applicable. No additional management support is needed unless otherwise documented below in the visit note. 

## 2016-05-19 DIAGNOSIS — H9193 Unspecified hearing loss, bilateral: Secondary | ICD-10-CM | POA: Diagnosis not present

## 2016-05-19 DIAGNOSIS — R296 Repeated falls: Secondary | ICD-10-CM | POA: Diagnosis not present

## 2016-05-19 DIAGNOSIS — I10 Essential (primary) hypertension: Secondary | ICD-10-CM | POA: Diagnosis not present

## 2016-05-19 DIAGNOSIS — Z8551 Personal history of malignant neoplasm of bladder: Secondary | ICD-10-CM | POA: Diagnosis not present

## 2016-05-19 DIAGNOSIS — N400 Enlarged prostate without lower urinary tract symptoms: Secondary | ICD-10-CM | POA: Diagnosis not present

## 2016-05-19 DIAGNOSIS — I739 Peripheral vascular disease, unspecified: Secondary | ICD-10-CM | POA: Diagnosis not present

## 2016-05-19 DIAGNOSIS — S080XXD Avulsion of scalp, subsequent encounter: Secondary | ICD-10-CM | POA: Diagnosis not present

## 2016-05-19 DIAGNOSIS — Z9181 History of falling: Secondary | ICD-10-CM | POA: Diagnosis not present

## 2016-05-19 DIAGNOSIS — S61412D Laceration without foreign body of left hand, subsequent encounter: Secondary | ICD-10-CM | POA: Diagnosis not present

## 2016-05-19 DIAGNOSIS — Z8572 Personal history of non-Hodgkin lymphomas: Secondary | ICD-10-CM | POA: Diagnosis not present

## 2016-05-19 DIAGNOSIS — Z85038 Personal history of other malignant neoplasm of large intestine: Secondary | ICD-10-CM | POA: Diagnosis not present

## 2016-05-19 DIAGNOSIS — S41122D Laceration with foreign body of left upper arm, subsequent encounter: Secondary | ICD-10-CM | POA: Diagnosis not present

## 2016-05-21 DIAGNOSIS — R296 Repeated falls: Secondary | ICD-10-CM | POA: Diagnosis not present

## 2016-05-21 DIAGNOSIS — N400 Enlarged prostate without lower urinary tract symptoms: Secondary | ICD-10-CM | POA: Diagnosis not present

## 2016-05-21 DIAGNOSIS — S41122D Laceration with foreign body of left upper arm, subsequent encounter: Secondary | ICD-10-CM | POA: Diagnosis not present

## 2016-05-21 DIAGNOSIS — Z8551 Personal history of malignant neoplasm of bladder: Secondary | ICD-10-CM | POA: Diagnosis not present

## 2016-05-21 DIAGNOSIS — Z9181 History of falling: Secondary | ICD-10-CM | POA: Diagnosis not present

## 2016-05-21 DIAGNOSIS — S61412D Laceration without foreign body of left hand, subsequent encounter: Secondary | ICD-10-CM | POA: Diagnosis not present

## 2016-05-21 DIAGNOSIS — Z85038 Personal history of other malignant neoplasm of large intestine: Secondary | ICD-10-CM | POA: Diagnosis not present

## 2016-05-21 DIAGNOSIS — I10 Essential (primary) hypertension: Secondary | ICD-10-CM | POA: Diagnosis not present

## 2016-05-21 DIAGNOSIS — Z8572 Personal history of non-Hodgkin lymphomas: Secondary | ICD-10-CM | POA: Diagnosis not present

## 2016-05-21 DIAGNOSIS — I739 Peripheral vascular disease, unspecified: Secondary | ICD-10-CM | POA: Diagnosis not present

## 2016-05-21 DIAGNOSIS — H9193 Unspecified hearing loss, bilateral: Secondary | ICD-10-CM | POA: Diagnosis not present

## 2016-05-21 DIAGNOSIS — S080XXD Avulsion of scalp, subsequent encounter: Secondary | ICD-10-CM | POA: Diagnosis not present

## 2016-05-24 DIAGNOSIS — R296 Repeated falls: Secondary | ICD-10-CM | POA: Diagnosis not present

## 2016-05-24 DIAGNOSIS — H9193 Unspecified hearing loss, bilateral: Secondary | ICD-10-CM | POA: Diagnosis not present

## 2016-05-24 DIAGNOSIS — I739 Peripheral vascular disease, unspecified: Secondary | ICD-10-CM | POA: Diagnosis not present

## 2016-05-24 DIAGNOSIS — I10 Essential (primary) hypertension: Secondary | ICD-10-CM | POA: Diagnosis not present

## 2016-05-24 DIAGNOSIS — S080XXD Avulsion of scalp, subsequent encounter: Secondary | ICD-10-CM | POA: Diagnosis not present

## 2016-05-24 DIAGNOSIS — S61412D Laceration without foreign body of left hand, subsequent encounter: Secondary | ICD-10-CM | POA: Diagnosis not present

## 2016-05-24 DIAGNOSIS — Z85038 Personal history of other malignant neoplasm of large intestine: Secondary | ICD-10-CM | POA: Diagnosis not present

## 2016-05-24 DIAGNOSIS — Z9181 History of falling: Secondary | ICD-10-CM | POA: Diagnosis not present

## 2016-05-24 DIAGNOSIS — N400 Enlarged prostate without lower urinary tract symptoms: Secondary | ICD-10-CM | POA: Diagnosis not present

## 2016-05-24 DIAGNOSIS — Z8551 Personal history of malignant neoplasm of bladder: Secondary | ICD-10-CM | POA: Diagnosis not present

## 2016-05-24 DIAGNOSIS — Z8572 Personal history of non-Hodgkin lymphomas: Secondary | ICD-10-CM | POA: Diagnosis not present

## 2016-05-24 DIAGNOSIS — S41122D Laceration with foreign body of left upper arm, subsequent encounter: Secondary | ICD-10-CM | POA: Diagnosis not present

## 2016-05-26 ENCOUNTER — Ambulatory Visit (INDEPENDENT_AMBULATORY_CARE_PROVIDER_SITE_OTHER): Payer: Medicare Other | Admitting: Family Medicine

## 2016-05-26 ENCOUNTER — Encounter: Payer: Self-pay | Admitting: Family Medicine

## 2016-05-26 VITALS — BP 120/74 | HR 68 | Temp 98.2°F | Wt 181.6 lb

## 2016-05-26 DIAGNOSIS — T148XXA Other injury of unspecified body region, initial encounter: Secondary | ICD-10-CM

## 2016-05-26 DIAGNOSIS — R238 Other skin changes: Secondary | ICD-10-CM

## 2016-05-26 NOTE — Progress Notes (Signed)
Subjective:     Patient ID: David Howard, male   DOB: Jun 13, 1923, 83 y.o.   MRN: [id number]  HPI Patient here for wound recheck following recent probable brief syncopal episode at home. He has had no further dizziness or syncope since then. We removed sutures from head laceration last visit. They're now washing his hair. We have home health nurse coming out to reassess left elbow and left hand avulsion lacerations. They are healing very well.   He still has significant nocturia but apparently good strong urine flow. He is followed by urology. Recent discontinuation of Cardura to reduce risk of falls.  Past Medical History:  Diagnosis Date  . Benign neoplasm of colon   . Diverticulosis of colon (without mention of hemorrhage)   . Elevated prostate specific antigen (PSA)    BPH, follows with uro  . Esophageal reflux   . Hearing loss    L>R, follows with ENT, wears hearing aides  . Inguinal hernia without mention of obstruction or gangrene, unilateral or unspecified, (not specified as recurrent)   . Internal hemorrhoids without mention of complication   . Malignant neoplasm of bladder, part unspecified   . Other abnormal glucose   . Peripheral vascular disease, unspecified ([place])   . Personal history of other lymphatic and hematopoietic neoplasm 2003   Primary cutaneous B-cell lymphoma, s/p xrt  . Spinal stenosis, unspecified region other than cervical    R>L LE pain  . Unspecified essential hypertension    Past Surgical History:  Procedure Laterality Date  . CATARACT EXTRACTION    . LAPAROSCOPY    . PILONIDAL CYST / SINUS EXCISION    . TONSILLECTOMY      reports that he has quit smoking. His smoking use included Cigarettes. He has never used smokeless tobacco. He reports that he drinks alcohol. He reports that he does not use drugs. family history includes Kidney disease in his father. No Known Allergies   Review of Systems  Constitutional: Negative for fatigue.  Eyes:  Negative for visual disturbance.  Respiratory: Negative for cough, chest tightness and shortness of breath.   Cardiovascular: Negative for chest pain, palpitations and leg swelling.  Neurological: Negative for dizziness, syncope, weakness, light-headedness and headaches.       Objective:   Physical Exam  Constitutional: He appears well-developed and well-nourished.  Skin:  Thick eschar left frontal region from recent laceration. No signs of secondary infection  Left elbow wound has almost completely closed over. He has very thin eschar over this region. No signs of secondary infection  Left hand wound is healing very well-but not closed over this point       Assessment:     Multiple wounds including frontal laceration, left hand and left elbow avulsion wounds following fall healing well    Plan:     -May leave off dressing left elbow at this time -Reapply left hand dressing -they will have home health nurse coming out for another week.  [name] MD [facility] at [facility]

## 2016-05-29 DIAGNOSIS — Z9181 History of falling: Secondary | ICD-10-CM | POA: Diagnosis not present

## 2016-05-29 DIAGNOSIS — S080XXD Avulsion of scalp, subsequent encounter: Secondary | ICD-10-CM | POA: Diagnosis not present

## 2016-05-29 DIAGNOSIS — Z8551 Personal history of malignant neoplasm of bladder: Secondary | ICD-10-CM | POA: Diagnosis not present

## 2016-05-29 DIAGNOSIS — Z85038 Personal history of other malignant neoplasm of large intestine: Secondary | ICD-10-CM | POA: Diagnosis not present

## 2016-05-29 DIAGNOSIS — R296 Repeated falls: Secondary | ICD-10-CM | POA: Diagnosis not present

## 2016-05-29 DIAGNOSIS — I10 Essential (primary) hypertension: Secondary | ICD-10-CM | POA: Diagnosis not present

## 2016-05-29 DIAGNOSIS — S41122D Laceration with foreign body of left upper arm, subsequent encounter: Secondary | ICD-10-CM | POA: Diagnosis not present

## 2016-05-29 DIAGNOSIS — S61412D Laceration without foreign body of left hand, subsequent encounter: Secondary | ICD-10-CM | POA: Diagnosis not present

## 2016-05-29 DIAGNOSIS — I739 Peripheral vascular disease, unspecified: Secondary | ICD-10-CM | POA: Diagnosis not present

## 2016-05-29 DIAGNOSIS — H9193 Unspecified hearing loss, bilateral: Secondary | ICD-10-CM | POA: Diagnosis not present

## 2016-05-29 DIAGNOSIS — Z8572 Personal history of non-Hodgkin lymphomas: Secondary | ICD-10-CM | POA: Diagnosis not present

## 2016-05-29 DIAGNOSIS — N400 Enlarged prostate without lower urinary tract symptoms: Secondary | ICD-10-CM | POA: Diagnosis not present

## 2016-06-04 DIAGNOSIS — R296 Repeated falls: Secondary | ICD-10-CM | POA: Diagnosis not present

## 2016-06-04 DIAGNOSIS — S080XXD Avulsion of scalp, subsequent encounter: Secondary | ICD-10-CM | POA: Diagnosis not present

## 2016-06-04 DIAGNOSIS — N400 Enlarged prostate without lower urinary tract symptoms: Secondary | ICD-10-CM | POA: Diagnosis not present

## 2016-06-04 DIAGNOSIS — I10 Essential (primary) hypertension: Secondary | ICD-10-CM | POA: Diagnosis not present

## 2016-06-04 DIAGNOSIS — S41122D Laceration with foreign body of left upper arm, subsequent encounter: Secondary | ICD-10-CM | POA: Diagnosis not present

## 2016-06-04 DIAGNOSIS — H9193 Unspecified hearing loss, bilateral: Secondary | ICD-10-CM | POA: Diagnosis not present

## 2016-06-04 DIAGNOSIS — Z9181 History of falling: Secondary | ICD-10-CM | POA: Diagnosis not present

## 2016-06-04 DIAGNOSIS — S61412D Laceration without foreign body of left hand, subsequent encounter: Secondary | ICD-10-CM | POA: Diagnosis not present

## 2016-06-04 DIAGNOSIS — I739 Peripheral vascular disease, unspecified: Secondary | ICD-10-CM | POA: Diagnosis not present

## 2016-06-04 DIAGNOSIS — Z85038 Personal history of other malignant neoplasm of large intestine: Secondary | ICD-10-CM | POA: Diagnosis not present

## 2016-06-04 DIAGNOSIS — Z8572 Personal history of non-Hodgkin lymphomas: Secondary | ICD-10-CM | POA: Diagnosis not present

## 2016-06-04 DIAGNOSIS — Z8551 Personal history of malignant neoplasm of bladder: Secondary | ICD-10-CM | POA: Diagnosis not present

## 2016-06-07 ENCOUNTER — Telehealth: Payer: Self-pay | Admitting: Family Medicine

## 2016-06-07 NOTE — Telephone Encounter (Signed)
Son, David Howard [name]), is calling with concerns about his father's memory declining.  Son says for the last 4-6 months he has been repeating the same questions.  He has also noticed his father has been sleeping more.  His wife and daughter had spent the day and night with the patient and the next morning the patient asked when did they arrive.  Patient is also forgetting people's names that he knows.  Patient has an appointment 06/15/16 and son would like this addressed at the appointment, because his mom will not discuss it with the patient in the room.  His number is [phone].  FYI

## 2016-06-07 NOTE — Telephone Encounter (Signed)
pts son [name]) would like to have a call back to discuss a personal matter.

## 2016-06-08 DIAGNOSIS — H9193 Unspecified hearing loss, bilateral: Secondary | ICD-10-CM | POA: Diagnosis not present

## 2016-06-08 DIAGNOSIS — S41122D Laceration with foreign body of left upper arm, subsequent encounter: Secondary | ICD-10-CM | POA: Diagnosis not present

## 2016-06-08 DIAGNOSIS — S61412D Laceration without foreign body of left hand, subsequent encounter: Secondary | ICD-10-CM | POA: Diagnosis not present

## 2016-06-08 DIAGNOSIS — Z8551 Personal history of malignant neoplasm of bladder: Secondary | ICD-10-CM | POA: Diagnosis not present

## 2016-06-08 DIAGNOSIS — S080XXD Avulsion of scalp, subsequent encounter: Secondary | ICD-10-CM | POA: Diagnosis not present

## 2016-06-08 DIAGNOSIS — H903 Sensorineural hearing loss, bilateral: Secondary | ICD-10-CM | POA: Diagnosis not present

## 2016-06-08 DIAGNOSIS — I10 Essential (primary) hypertension: Secondary | ICD-10-CM | POA: Diagnosis not present

## 2016-06-08 DIAGNOSIS — I739 Peripheral vascular disease, unspecified: Secondary | ICD-10-CM | POA: Diagnosis not present

## 2016-06-08 DIAGNOSIS — Z85038 Personal history of other malignant neoplasm of large intestine: Secondary | ICD-10-CM | POA: Diagnosis not present

## 2016-06-08 DIAGNOSIS — R296 Repeated falls: Secondary | ICD-10-CM | POA: Diagnosis not present

## 2016-06-08 DIAGNOSIS — N400 Enlarged prostate without lower urinary tract symptoms: Secondary | ICD-10-CM | POA: Diagnosis not present

## 2016-06-08 DIAGNOSIS — Z9181 History of falling: Secondary | ICD-10-CM | POA: Diagnosis not present

## 2016-06-08 DIAGNOSIS — Z8572 Personal history of non-Hodgkin lymphomas: Secondary | ICD-10-CM | POA: Diagnosis not present

## 2016-06-08 DIAGNOSIS — H6123 Impacted cerumen, bilateral: Secondary | ICD-10-CM | POA: Diagnosis not present

## 2016-06-11 ENCOUNTER — Telehealth: Payer: Self-pay | Admitting: *Deleted

## 2016-06-11 DIAGNOSIS — Z8572 Personal history of non-Hodgkin lymphomas: Secondary | ICD-10-CM | POA: Diagnosis not present

## 2016-06-11 DIAGNOSIS — Z85038 Personal history of other malignant neoplasm of large intestine: Secondary | ICD-10-CM | POA: Diagnosis not present

## 2016-06-11 DIAGNOSIS — Z9181 History of falling: Secondary | ICD-10-CM | POA: Diagnosis not present

## 2016-06-11 DIAGNOSIS — S41122D Laceration with foreign body of left upper arm, subsequent encounter: Secondary | ICD-10-CM | POA: Diagnosis not present

## 2016-06-11 DIAGNOSIS — I739 Peripheral vascular disease, unspecified: Secondary | ICD-10-CM | POA: Diagnosis not present

## 2016-06-11 DIAGNOSIS — Z8551 Personal history of malignant neoplasm of bladder: Secondary | ICD-10-CM | POA: Diagnosis not present

## 2016-06-11 DIAGNOSIS — R296 Repeated falls: Secondary | ICD-10-CM | POA: Diagnosis not present

## 2016-06-11 DIAGNOSIS — S080XXD Avulsion of scalp, subsequent encounter: Secondary | ICD-10-CM | POA: Diagnosis not present

## 2016-06-11 DIAGNOSIS — I10 Essential (primary) hypertension: Secondary | ICD-10-CM | POA: Diagnosis not present

## 2016-06-11 DIAGNOSIS — N400 Enlarged prostate without lower urinary tract symptoms: Secondary | ICD-10-CM | POA: Diagnosis not present

## 2016-06-11 DIAGNOSIS — H9193 Unspecified hearing loss, bilateral: Secondary | ICD-10-CM | POA: Diagnosis not present

## 2016-06-11 DIAGNOSIS — S61412D Laceration without foreign body of left hand, subsequent encounter: Secondary | ICD-10-CM | POA: Diagnosis not present

## 2016-06-11 NOTE — Telephone Encounter (Signed)
Patient's wife called asking if she and the pt should be fasting for their appts on 5/29 and what labs they would need(see note on wife's chart from 5/25).  Per Dr Elease Hashimoto they should be fasting and the orders for labs were entered as well and the pts wife was informed of this.

## 2016-06-15 ENCOUNTER — Encounter: Payer: Self-pay | Admitting: Family Medicine

## 2016-06-15 ENCOUNTER — Ambulatory Visit (INDEPENDENT_AMBULATORY_CARE_PROVIDER_SITE_OTHER): Payer: Medicare Other | Admitting: Family Medicine

## 2016-06-15 VITALS — BP 132/80 | HR 66 | Temp 97.9°F | Wt 179.3 lb

## 2016-06-15 DIAGNOSIS — Z8551 Personal history of malignant neoplasm of bladder: Secondary | ICD-10-CM | POA: Diagnosis not present

## 2016-06-15 DIAGNOSIS — S61412D Laceration without foreign body of left hand, subsequent encounter: Secondary | ICD-10-CM | POA: Diagnosis not present

## 2016-06-15 DIAGNOSIS — Z9181 History of falling: Secondary | ICD-10-CM | POA: Diagnosis not present

## 2016-06-15 DIAGNOSIS — R296 Repeated falls: Secondary | ICD-10-CM | POA: Diagnosis not present

## 2016-06-15 DIAGNOSIS — S41122D Laceration with foreign body of left upper arm, subsequent encounter: Secondary | ICD-10-CM | POA: Diagnosis not present

## 2016-06-15 DIAGNOSIS — I10 Essential (primary) hypertension: Secondary | ICD-10-CM

## 2016-06-15 DIAGNOSIS — G3184 Mild cognitive impairment, so stated: Secondary | ICD-10-CM

## 2016-06-15 DIAGNOSIS — S080XXD Avulsion of scalp, subsequent encounter: Secondary | ICD-10-CM | POA: Diagnosis not present

## 2016-06-15 DIAGNOSIS — R3915 Urgency of urination: Secondary | ICD-10-CM | POA: Diagnosis not present

## 2016-06-15 DIAGNOSIS — Z8572 Personal history of non-Hodgkin lymphomas: Secondary | ICD-10-CM | POA: Diagnosis not present

## 2016-06-15 DIAGNOSIS — N400 Enlarged prostate without lower urinary tract symptoms: Secondary | ICD-10-CM | POA: Diagnosis not present

## 2016-06-15 DIAGNOSIS — Z85038 Personal history of other malignant neoplasm of large intestine: Secondary | ICD-10-CM | POA: Diagnosis not present

## 2016-06-15 DIAGNOSIS — H9193 Unspecified hearing loss, bilateral: Secondary | ICD-10-CM | POA: Diagnosis not present

## 2016-06-15 DIAGNOSIS — I739 Peripheral vascular disease, unspecified: Secondary | ICD-10-CM | POA: Diagnosis not present

## 2016-06-15 LAB — BASIC METABOLIC PANEL
BUN: 17 mg/dL (ref 6–23)
CALCIUM: 9.5 mg/dL (ref 8.4–10.5)
CO2: 28 mEq/L (ref 19–32)
Chloride: 98 mEq/L (ref 96–112)
Creatinine, Ser: 1.16 mg/dL (ref 0.40–1.50)
GFR: 62.75 mL/min (ref >60.00)
GLUCOSE: 104 mg/dL — AB (ref 70–99)
POTASSIUM: 4.3 meq/L (ref 3.5–5.1)
SODIUM: 133 meq/L — AB (ref 135–145)

## 2016-06-15 LAB — LIPID PANEL
Cholesterol: 158 mg/dL (ref 0–200)
HDL: 66.4 mg/dL (ref >39.00)
LDL Cholesterol: 78 mg/dL (ref 0–99)
NONHDL: 91.94
TRIGLYCERIDES: 69 mg/dL (ref 0.0–149.0)
Total CHOL/HDL Ratio: 2
VLDL: 13.8 mg/dL (ref 0.0–40.0)

## 2016-06-15 LAB — HEPATIC FUNCTION PANEL
ALBUMIN: 4.1 g/dL (ref 3.5–5.2)
ALK PHOS: 40 U/L (ref 39–117)
ALT: 14 U/L (ref 0–53)
AST: 19 U/L (ref 0–37)
Bilirubin, Direct: 0.3 mg/dL (ref 0.0–0.3)
Total Bilirubin: 1.1 mg/dL (ref 0.2–1.2)
Total Protein: 6.7 g/dL (ref 6.0–8.3)

## 2016-06-15 LAB — TSH: TSH: 1.42 u[IU]/mL (ref 0.35–4.50)

## 2016-06-15 LAB — VITAMIN B12: VITAMIN B 12: 684 pg/mL (ref 211–911)

## 2016-06-15 NOTE — Progress Notes (Signed)
Subjective:     Patient ID: David Howard, male   DOB: 1923/07/21, 83 y.o.   MRN: [id number]  HPI Here for medical follow-up. Recent fall early one morning after getting out of shower with multiple avulsion lacerations. Home health still following left hand injury but this is healing very well. No further falls. We discontinued Cardura and wife helping to make sure he is hydrating early in the mornings.  Chronic problem with nocturia about 5 times per night. Denies any slow stream. He feels he is emptying his bladder well.  Family has some concerns about short-term memory problems. He's had difficulty remembering names. This has been progressive for several months-at least.  Chronic positive history of hypertension, peripheral vascular disease, GERD, BPH, spinal stenosis, remote history of non-Hodgkin's lymphoma  Past Medical History:  Diagnosis Date  . Benign neoplasm of colon   . Diverticulosis of colon (without mention of hemorrhage)   . Elevated prostate specific antigen (PSA)    BPH, follows with uro  . Esophageal reflux   . Hearing loss    L>R, follows with ENT, wears hearing aides  . Inguinal hernia without mention of obstruction or gangrene, unilateral or unspecified, (not specified as recurrent)   . Internal hemorrhoids without mention of complication   . Malignant neoplasm of bladder, part unspecified   . Other abnormal glucose   . Peripheral vascular disease, unspecified ([place])   . Personal history of other lymphatic and hematopoietic neoplasm 2003   Primary cutaneous B-cell lymphoma, s/p xrt  . Spinal stenosis, unspecified region other than cervical    R>L LE pain  . Unspecified essential hypertension    Past Surgical History:  Procedure Laterality Date  . CATARACT EXTRACTION    . LAPAROSCOPY    . PILONIDAL CYST / SINUS EXCISION    . TONSILLECTOMY      reports that he has quit smoking. His smoking use included Cigarettes. He has never used smokeless tobacco. He  reports that he drinks alcohol. He reports that he does not use drugs. family history includes Kidney disease in his father. No Known Allergies   Review of Systems  Constitutional: Negative for appetite change, fatigue and unexpected weight change.  Eyes: Negative for visual disturbance.  Respiratory: Negative for cough, chest tightness and shortness of breath.   Cardiovascular: Negative for chest pain, palpitations and leg swelling.  Gastrointestinal: Negative for abdominal pain.  Endocrine: Negative for polydipsia and polyuria.  Neurological: Negative for dizziness, syncope, weakness, light-headedness and headaches.       Objective:   Physical Exam  Constitutional: He is oriented to person, place, and time. He appears well-developed and well-nourished.  HENT:  Right Ear: External ear normal.  Left Ear: External ear normal.  Mouth/Throat: Oropharynx is clear and moist.  Eyes: Pupils are equal, round, and reactive to light.  Neck: Neck supple. No thyromegaly present.  Cardiovascular: Normal rate and regular rhythm.   Pulmonary/Chest: Effort normal and breath sounds normal. No respiratory distress. He has no wheezes. He has no rales.  Musculoskeletal: He exhibits no edema.  Neurological: He is alert and oriented to person, place, and time.       Assessment:     #1 hypertension stable and a goal  #2 history of BPH symptomatically stable  #3 urinary urgency  #4 mild cognitive impairment    Plan:     -Check labs with TSH, B12, lipid, hepatic panel, basic metabolic panel -Continue close follow-up with urology regarding his BPH and urinary symptoms -  Would avoid anticholinergic medications given his age -routine follow up in 3 months.  [name] MD [facility] at [facility]

## 2016-06-18 DIAGNOSIS — I10 Essential (primary) hypertension: Secondary | ICD-10-CM | POA: Diagnosis not present

## 2016-06-18 DIAGNOSIS — Z85038 Personal history of other malignant neoplasm of large intestine: Secondary | ICD-10-CM | POA: Diagnosis not present

## 2016-06-18 DIAGNOSIS — Z8551 Personal history of malignant neoplasm of bladder: Secondary | ICD-10-CM | POA: Diagnosis not present

## 2016-06-18 DIAGNOSIS — Z8572 Personal history of non-Hodgkin lymphomas: Secondary | ICD-10-CM | POA: Diagnosis not present

## 2016-06-18 DIAGNOSIS — N400 Enlarged prostate without lower urinary tract symptoms: Secondary | ICD-10-CM | POA: Diagnosis not present

## 2016-06-18 DIAGNOSIS — H9193 Unspecified hearing loss, bilateral: Secondary | ICD-10-CM | POA: Diagnosis not present

## 2016-06-18 DIAGNOSIS — R296 Repeated falls: Secondary | ICD-10-CM | POA: Diagnosis not present

## 2016-06-18 DIAGNOSIS — I739 Peripheral vascular disease, unspecified: Secondary | ICD-10-CM | POA: Diagnosis not present

## 2016-06-18 DIAGNOSIS — Z9181 History of falling: Secondary | ICD-10-CM | POA: Diagnosis not present

## 2016-06-18 DIAGNOSIS — S41122D Laceration with foreign body of left upper arm, subsequent encounter: Secondary | ICD-10-CM | POA: Diagnosis not present

## 2016-06-18 DIAGNOSIS — S080XXD Avulsion of scalp, subsequent encounter: Secondary | ICD-10-CM | POA: Diagnosis not present

## 2016-06-18 DIAGNOSIS — S61412D Laceration without foreign body of left hand, subsequent encounter: Secondary | ICD-10-CM | POA: Diagnosis not present

## 2016-06-22 DIAGNOSIS — I739 Peripheral vascular disease, unspecified: Secondary | ICD-10-CM | POA: Diagnosis not present

## 2016-06-22 DIAGNOSIS — R296 Repeated falls: Secondary | ICD-10-CM | POA: Diagnosis not present

## 2016-06-22 DIAGNOSIS — Z9181 History of falling: Secondary | ICD-10-CM | POA: Diagnosis not present

## 2016-06-22 DIAGNOSIS — Z8572 Personal history of non-Hodgkin lymphomas: Secondary | ICD-10-CM | POA: Diagnosis not present

## 2016-06-22 DIAGNOSIS — S41122D Laceration with foreign body of left upper arm, subsequent encounter: Secondary | ICD-10-CM | POA: Diagnosis not present

## 2016-06-22 DIAGNOSIS — Z85038 Personal history of other malignant neoplasm of large intestine: Secondary | ICD-10-CM | POA: Diagnosis not present

## 2016-06-22 DIAGNOSIS — N400 Enlarged prostate without lower urinary tract symptoms: Secondary | ICD-10-CM | POA: Diagnosis not present

## 2016-06-22 DIAGNOSIS — S080XXD Avulsion of scalp, subsequent encounter: Secondary | ICD-10-CM | POA: Diagnosis not present

## 2016-06-22 DIAGNOSIS — H9193 Unspecified hearing loss, bilateral: Secondary | ICD-10-CM | POA: Diagnosis not present

## 2016-06-22 DIAGNOSIS — S61412D Laceration without foreign body of left hand, subsequent encounter: Secondary | ICD-10-CM | POA: Diagnosis not present

## 2016-06-22 DIAGNOSIS — Z8551 Personal history of malignant neoplasm of bladder: Secondary | ICD-10-CM | POA: Diagnosis not present

## 2016-06-22 DIAGNOSIS — I10 Essential (primary) hypertension: Secondary | ICD-10-CM | POA: Diagnosis not present

## 2016-06-25 DIAGNOSIS — R296 Repeated falls: Secondary | ICD-10-CM | POA: Diagnosis not present

## 2016-06-25 DIAGNOSIS — S61412D Laceration without foreign body of left hand, subsequent encounter: Secondary | ICD-10-CM | POA: Diagnosis not present

## 2016-06-25 DIAGNOSIS — S41122D Laceration with foreign body of left upper arm, subsequent encounter: Secondary | ICD-10-CM | POA: Diagnosis not present

## 2016-06-25 DIAGNOSIS — Z8572 Personal history of non-Hodgkin lymphomas: Secondary | ICD-10-CM | POA: Diagnosis not present

## 2016-06-25 DIAGNOSIS — N400 Enlarged prostate without lower urinary tract symptoms: Secondary | ICD-10-CM | POA: Diagnosis not present

## 2016-06-25 DIAGNOSIS — Z8551 Personal history of malignant neoplasm of bladder: Secondary | ICD-10-CM | POA: Diagnosis not present

## 2016-06-25 DIAGNOSIS — Z9181 History of falling: Secondary | ICD-10-CM | POA: Diagnosis not present

## 2016-06-25 DIAGNOSIS — I10 Essential (primary) hypertension: Secondary | ICD-10-CM | POA: Diagnosis not present

## 2016-06-25 DIAGNOSIS — I739 Peripheral vascular disease, unspecified: Secondary | ICD-10-CM | POA: Diagnosis not present

## 2016-06-25 DIAGNOSIS — Z85038 Personal history of other malignant neoplasm of large intestine: Secondary | ICD-10-CM | POA: Diagnosis not present

## 2016-06-25 DIAGNOSIS — S080XXD Avulsion of scalp, subsequent encounter: Secondary | ICD-10-CM | POA: Diagnosis not present

## 2016-06-25 DIAGNOSIS — H9193 Unspecified hearing loss, bilateral: Secondary | ICD-10-CM | POA: Diagnosis not present

## 2016-06-28 DIAGNOSIS — Z85038 Personal history of other malignant neoplasm of large intestine: Secondary | ICD-10-CM | POA: Diagnosis not present

## 2016-06-28 DIAGNOSIS — R296 Repeated falls: Secondary | ICD-10-CM | POA: Diagnosis not present

## 2016-06-28 DIAGNOSIS — Z8572 Personal history of non-Hodgkin lymphomas: Secondary | ICD-10-CM | POA: Diagnosis not present

## 2016-06-28 DIAGNOSIS — N400 Enlarged prostate without lower urinary tract symptoms: Secondary | ICD-10-CM | POA: Diagnosis not present

## 2016-06-28 DIAGNOSIS — Z9181 History of falling: Secondary | ICD-10-CM | POA: Diagnosis not present

## 2016-06-28 DIAGNOSIS — S080XXD Avulsion of scalp, subsequent encounter: Secondary | ICD-10-CM | POA: Diagnosis not present

## 2016-06-28 DIAGNOSIS — Z8551 Personal history of malignant neoplasm of bladder: Secondary | ICD-10-CM | POA: Diagnosis not present

## 2016-06-28 DIAGNOSIS — S41122D Laceration with foreign body of left upper arm, subsequent encounter: Secondary | ICD-10-CM | POA: Diagnosis not present

## 2016-06-28 DIAGNOSIS — S61412D Laceration without foreign body of left hand, subsequent encounter: Secondary | ICD-10-CM | POA: Diagnosis not present

## 2016-06-28 DIAGNOSIS — I739 Peripheral vascular disease, unspecified: Secondary | ICD-10-CM | POA: Diagnosis not present

## 2016-06-28 DIAGNOSIS — H9193 Unspecified hearing loss, bilateral: Secondary | ICD-10-CM | POA: Diagnosis not present

## 2016-06-28 DIAGNOSIS — I10 Essential (primary) hypertension: Secondary | ICD-10-CM | POA: Diagnosis not present

## 2016-07-02 DIAGNOSIS — N400 Enlarged prostate without lower urinary tract symptoms: Secondary | ICD-10-CM | POA: Diagnosis not present

## 2016-07-02 DIAGNOSIS — H9193 Unspecified hearing loss, bilateral: Secondary | ICD-10-CM | POA: Diagnosis not present

## 2016-07-02 DIAGNOSIS — I739 Peripheral vascular disease, unspecified: Secondary | ICD-10-CM | POA: Diagnosis not present

## 2016-07-02 DIAGNOSIS — R296 Repeated falls: Secondary | ICD-10-CM | POA: Diagnosis not present

## 2016-07-02 DIAGNOSIS — I10 Essential (primary) hypertension: Secondary | ICD-10-CM | POA: Diagnosis not present

## 2016-07-02 DIAGNOSIS — Z9181 History of falling: Secondary | ICD-10-CM | POA: Diagnosis not present

## 2016-07-02 DIAGNOSIS — Z85038 Personal history of other malignant neoplasm of large intestine: Secondary | ICD-10-CM | POA: Diagnosis not present

## 2016-07-02 DIAGNOSIS — S41122D Laceration with foreign body of left upper arm, subsequent encounter: Secondary | ICD-10-CM | POA: Diagnosis not present

## 2016-07-02 DIAGNOSIS — S080XXD Avulsion of scalp, subsequent encounter: Secondary | ICD-10-CM | POA: Diagnosis not present

## 2016-07-02 DIAGNOSIS — Z8551 Personal history of malignant neoplasm of bladder: Secondary | ICD-10-CM | POA: Diagnosis not present

## 2016-07-02 DIAGNOSIS — Z8572 Personal history of non-Hodgkin lymphomas: Secondary | ICD-10-CM | POA: Diagnosis not present

## 2016-07-02 DIAGNOSIS — S61412D Laceration without foreign body of left hand, subsequent encounter: Secondary | ICD-10-CM | POA: Diagnosis not present

## 2016-07-06 DIAGNOSIS — S080XXD Avulsion of scalp, subsequent encounter: Secondary | ICD-10-CM | POA: Diagnosis not present

## 2016-07-06 DIAGNOSIS — I739 Peripheral vascular disease, unspecified: Secondary | ICD-10-CM | POA: Diagnosis not present

## 2016-07-06 DIAGNOSIS — R296 Repeated falls: Secondary | ICD-10-CM | POA: Diagnosis not present

## 2016-07-06 DIAGNOSIS — S61412D Laceration without foreign body of left hand, subsequent encounter: Secondary | ICD-10-CM | POA: Diagnosis not present

## 2016-07-06 DIAGNOSIS — S41122D Laceration with foreign body of left upper arm, subsequent encounter: Secondary | ICD-10-CM | POA: Diagnosis not present

## 2016-07-06 DIAGNOSIS — N400 Enlarged prostate without lower urinary tract symptoms: Secondary | ICD-10-CM | POA: Diagnosis not present

## 2016-07-06 DIAGNOSIS — Z8572 Personal history of non-Hodgkin lymphomas: Secondary | ICD-10-CM | POA: Diagnosis not present

## 2016-07-06 DIAGNOSIS — I10 Essential (primary) hypertension: Secondary | ICD-10-CM | POA: Diagnosis not present

## 2016-07-06 DIAGNOSIS — Z9181 History of falling: Secondary | ICD-10-CM | POA: Diagnosis not present

## 2016-07-06 DIAGNOSIS — Z85038 Personal history of other malignant neoplasm of large intestine: Secondary | ICD-10-CM | POA: Diagnosis not present

## 2016-07-06 DIAGNOSIS — Z8551 Personal history of malignant neoplasm of bladder: Secondary | ICD-10-CM | POA: Diagnosis not present

## 2016-07-06 DIAGNOSIS — H9193 Unspecified hearing loss, bilateral: Secondary | ICD-10-CM | POA: Diagnosis not present

## 2016-07-11 ENCOUNTER — Telehealth: Payer: Self-pay | Admitting: Oncology

## 2016-07-11 NOTE — Telephone Encounter (Signed)
S/w pt's wife, advised 6/29 appt moved to 7/6 @ 10.30 per md on call.

## 2016-07-16 ENCOUNTER — Ambulatory Visit: Payer: Medicare Other | Admitting: Oncology

## 2016-07-23 ENCOUNTER — Ambulatory Visit (HOSPITAL_BASED_OUTPATIENT_CLINIC_OR_DEPARTMENT_OTHER): Payer: Medicare Other | Admitting: Oncology

## 2016-07-23 ENCOUNTER — Telehealth: Payer: Self-pay | Admitting: Oncology

## 2016-07-23 VITALS — BP 149/76 | HR 81 | Temp 97.9°F | Resp 18 | Ht 68.0 in | Wt 180.6 lb

## 2016-07-23 DIAGNOSIS — Z8572 Personal history of non-Hodgkin lymphomas: Secondary | ICD-10-CM | POA: Diagnosis not present

## 2016-07-23 DIAGNOSIS — C8590 Non-Hodgkin lymphoma, unspecified, unspecified site: Secondary | ICD-10-CM

## 2016-07-23 NOTE — Telephone Encounter (Signed)
Scheduled apt per 7/6 los - Gave patient AVS an calender per los.

## 2016-07-23 NOTE — Progress Notes (Signed)
  David OFFICE PROGRESS NOTE   Diagnosis: Non-Hodgkin's lymphoma  INTERVAL HISTORY:   David Howard returns as scheduled. He complains of nocturia. He goes 5-7 times each night. He is followed by urology. No fever or night sweats. No hematuria. He has mild exertional dyspnea. His appetite has diminished.  Objective:  Vital signs in last 24 hours:  Blood pressure (!) 149/76, pulse 81, temperature 97.9 F (36.6 C), temperature source Oral, resp. rate 18, height 5\' 8"  (1.727 m), weight 180 lb 9.6 oz (81.9 kg), SpO2 99 %.    HEENT: Neck without mass Lymphatics: No cervical, supraclavicular, axillary, or inguinal nodes Resp: Decreased breath sounds with rales at the posterior bases bilaterally, rhonchi at the left posterior base, no respiratory distress Cardio: Regular rate and rhythm GI: No hepatomegaly, nontender, no mass Vascular: No leg edema  Skin: Round dark mole at the mid upper back, no evidence of recurrent tumor at the lower back. No nodular skin lesions. Multiple benign appearing moles over the trunk   Lab Results:  Lab Results  Component Value Date   WBC 6.7 05/08/2016   HGB 13.3 05/08/2016   HCT 37.9 (L) 05/08/2016   MCV 85.7 05/08/2016   PLT 121 (L) 05/08/2016   NEUTROABS 5.1 05/08/2016    CMP     Component Value Date/Time   NA 133 (L) 06/15/2016 1010   K 4.3 06/15/2016 1010   CL 98 06/15/2016 1010   CO2 28 06/15/2016 1010   GLUCOSE 104 (H) 06/15/2016 1010   BUN 17 06/15/2016 1010   CREATININE 1.16 06/15/2016 1010   CALCIUM 9.5 06/15/2016 1010   PROT 6.7 06/15/2016 1010   ALBUMIN 4.1 06/15/2016 1010   AST 19 06/15/2016 1010   ALT 14 06/15/2016 1010   ALKPHOS 40 06/15/2016 1010   BILITOT 1.1 06/15/2016 1010   GFRNONAA 51 (L) 05/08/2016 0833   GFRAA 59 (L) 05/08/2016 4580     Medications: I have reviewed the patient's current medications.  Assessment/Plan: 1. Primary cutaneous B-cell lymphoma, status post primary radiation in  2003. No clinical evidence for recurrent disease. 2. Chronic prominence at the right medial scapula, likely benign asymmetry of the back musculature. 3. History of benign prostatic hypertrophy and superficial bladder cancer, followed by Dr. Alinda Money. 4. History of a right inguinal hernia, status post repair by Dr. Zella Richer.  Disposition:  David Howard remains in clinical remission from non-Hodgkin's lymphoma. He would like to continue follow[facility]up at the [facility]. He will return for an office visit in one year. He is now followed by Dr. Elease Hashimoto for internal medicine care. He will continue follow-up with urology for management of the nocturia.  15 minutes were spent with the patient today. The majority of the time was used for counseling and coordination of care.   Donneta Romberg, MD  07/23/2016  10:39 AM

## 2016-08-18 ENCOUNTER — Ambulatory Visit (INDEPENDENT_AMBULATORY_CARE_PROVIDER_SITE_OTHER): Payer: Medicare Other | Admitting: Family Medicine

## 2016-08-18 ENCOUNTER — Encounter: Payer: Self-pay | Admitting: Family Medicine

## 2016-08-18 VITALS — BP 124/84 | HR 72 | Temp 98.2°F | Wt 184.5 lb

## 2016-08-18 DIAGNOSIS — M25462 Effusion, left knee: Secondary | ICD-10-CM | POA: Diagnosis not present

## 2016-08-18 NOTE — Patient Instructions (Addendum)
Ice left knee 15 to 30 minutes 3 times daily Elevate legs frequently. Let me see you back in 2 weeks if knee not improving.

## 2016-08-18 NOTE — Progress Notes (Signed)
Subjective:     Patient ID: David Howard, male   DOB: 10/05/1923, 83 y.o.   MRN: [id number]  HPI Patient seen with left knee pain and swelling. Denies any recent injury. He states he had meniscus surgery years ago. He describes this as a mild achiness which started about a week ago. He's had some mild swelling of the knee. No erythema. No bruising. Minimal left leg edema. No calf pain. No thigh pain. No locking or giving way. Still ambulating with walker without difficulty  Past Medical History:  Diagnosis Date  . Benign neoplasm of colon   . Diverticulosis of colon (without mention of hemorrhage)   . Elevated prostate specific antigen (PSA)    BPH, follows with uro  . Esophageal reflux   . Hearing loss    L>R, follows with ENT, wears hearing aides  . Inguinal hernia without mention of obstruction or gangrene, unilateral or unspecified, (not specified as recurrent)   . Internal hemorrhoids without mention of complication   . Malignant neoplasm of bladder, part unspecified   . Other abnormal glucose   . Peripheral vascular disease, unspecified ([place])   . Personal history of other lymphatic and hematopoietic neoplasm 2003   Primary cutaneous B-cell lymphoma, s/p xrt  . Spinal stenosis, unspecified region other than cervical    R>L LE pain  . Unspecified essential hypertension    Past Surgical History:  Procedure Laterality Date  . CATARACT EXTRACTION    . LAPAROSCOPY    . PILONIDAL CYST / SINUS EXCISION    . TONSILLECTOMY      reports that he has quit smoking. His smoking use included Cigarettes. He has never used smokeless tobacco. He reports that he drinks alcohol. He reports that he does not use drugs. family history includes Kidney disease in his father. No Known Allergies   Review of Systems  Constitutional: Negative for fatigue.  Eyes: Negative for visual disturbance.  Respiratory: Negative for cough, chest tightness and shortness of breath.   Cardiovascular: Negative  for chest pain, palpitations and leg swelling.  Neurological: Negative for dizziness, syncope, weakness, light-headedness and headaches.       Objective:   Physical Exam  Cardiovascular: Normal rate.   Pulmonary/Chest: Effort normal and breath sounds normal.  Musculoskeletal:  Left knee reveals small effusion. Minimal warmth. No erythema. Good range of motion. No localized tenderness. No popliteal swelling. No calf tenderness. No pitting edema       Assessment:     Left knee effusion. Minimal pain. Question meniscal    Plan:     -Recommend continued elevation and trial of icing 15-30 minutes 3 times daily -Reassess in a couple weeks if no improvement. -consider x-rays and possible steroid injection if no better in 2-3 weeks.  [name] MD [facility] at [facility]

## 2016-08-19 DIAGNOSIS — H903 Sensorineural hearing loss, bilateral: Secondary | ICD-10-CM | POA: Diagnosis not present

## 2016-08-19 DIAGNOSIS — H6123 Impacted cerumen, bilateral: Secondary | ICD-10-CM | POA: Diagnosis not present

## 2016-08-24 ENCOUNTER — Telehealth: Payer: Self-pay | Admitting: Family Medicine

## 2016-08-24 NOTE — Telephone Encounter (Signed)
pts wife would like to know if it is okay for the pt to take melatonin to assist with him sleeping.  Wife would like to have a call back to discuss in detail.

## 2016-08-24 NOTE — Telephone Encounter (Signed)
I think it would be OK to try low dose melatonin-   Could take 1 mg tablet and take ONE HALF (0.5mg ) about 30 minutes prior to bedtime.

## 2016-08-25 NOTE — Telephone Encounter (Signed)
Wife is aware

## 2016-08-31 DIAGNOSIS — Z961 Presence of intraocular lens: Secondary | ICD-10-CM | POA: Diagnosis not present

## 2016-08-31 DIAGNOSIS — H40013 Open angle with borderline findings, low risk, bilateral: Secondary | ICD-10-CM | POA: Diagnosis not present

## 2016-08-31 DIAGNOSIS — H353131 Nonexudative age-related macular degeneration, bilateral, early dry stage: Secondary | ICD-10-CM | POA: Diagnosis not present

## 2016-08-31 DIAGNOSIS — H35033 Hypertensive retinopathy, bilateral: Secondary | ICD-10-CM | POA: Diagnosis not present

## 2016-08-31 LAB — HM DIABETES EYE EXAM

## 2016-09-06 ENCOUNTER — Encounter: Payer: Self-pay | Admitting: Family Medicine

## 2016-09-06 ENCOUNTER — Ambulatory Visit (INDEPENDENT_AMBULATORY_CARE_PROVIDER_SITE_OTHER): Payer: Medicare Other | Admitting: Family Medicine

## 2016-09-06 VITALS — BP 128/80 | HR 84 | Temp 98.3°F | Wt 184.3 lb

## 2016-09-06 DIAGNOSIS — M25462 Effusion, left knee: Secondary | ICD-10-CM | POA: Diagnosis not present

## 2016-09-06 DIAGNOSIS — F5102 Adjustment insomnia: Secondary | ICD-10-CM

## 2016-09-06 DIAGNOSIS — I10 Essential (primary) hypertension: Secondary | ICD-10-CM

## 2016-09-06 NOTE — Progress Notes (Signed)
Subjective:     Patient ID: David Howard, male   DOB: 04/20/1923, 83 y.o.   MRN: [id number]  HPI Patient seen with persistent left knee effusion. He was seen here couple weeks ago. Denies any recent injury. Minimal pain. Meniscus surgery several years ago on the same knee. In total, about 3 weeks of effusion. No erythema. No bruising. No locking or giving way. No recent falls.  Had some recent difficulty sleeping and we had suggested over-the-counter melatonin 0.5 mg. Wife states he is helped slightly but he still has difficulty sleeping at night  Hypertension. Takes lisinopril 20 mg daily. Compliant with therapy. No headaches. No chest pains. Initial blood pressure here today elevated 148/90 and repeat prior to discharge 128/80.   Past Medical History:  Diagnosis Date  . Benign neoplasm of colon   . Diverticulosis of colon (without mention of hemorrhage)   . Elevated prostate specific antigen (PSA)    BPH, follows with uro  . Esophageal reflux   . Hearing loss    L>R, follows with ENT, wears hearing aides  . Inguinal hernia without mention of obstruction or gangrene, unilateral or unspecified, (not specified as recurrent)   . Internal hemorrhoids without mention of complication   . Malignant neoplasm of bladder, part unspecified   . Other abnormal glucose   . Peripheral vascular disease, unspecified ([place])   . Personal history of other lymphatic and hematopoietic neoplasm 2003   Primary cutaneous B-cell lymphoma, s/p xrt  . Spinal stenosis, unspecified region other than cervical    R>L LE pain  . Unspecified essential hypertension    Past Surgical History:  Procedure Laterality Date  . CATARACT EXTRACTION    . LAPAROSCOPY    . PILONIDAL CYST / SINUS EXCISION    . TONSILLECTOMY      reports that he has quit smoking. His smoking use included Cigarettes. He has never used smokeless tobacco. He reports that he drinks alcohol. He reports that he does not use drugs. family history  includes Kidney disease in his father. No Known Allergies   Review of Systems  Constitutional: Negative for chills, fatigue and fever.  Eyes: Negative for visual disturbance.  Respiratory: Negative for cough, chest tightness and shortness of breath.   Cardiovascular: Negative for chest pain, palpitations and leg swelling.  Musculoskeletal: Positive for arthralgias.  Neurological: Negative for dizziness, syncope, weakness, light-headedness and headaches.       Objective:   Physical Exam  Constitutional: He appears well-developed and well-nourished.  Cardiovascular: Normal rate and regular rhythm.   Pulmonary/Chest: Effort normal and breath sounds normal. No respiratory distress. He has no wheezes. He has no rales.  Musculoskeletal:  Left knee is slightly warm to touch compared to the right. No ecchymosis. No erythema. Moderate effusion. No localized tenderness       Assessment:     #1 persistent left knee effusion  #2 intermittent insomnia  #3 hypertension. Initial elevated reading but improved after rest    Plan:     -We decided to set up orthopedic referral to the surgeon he has seen previously for his meniscus surgery -Increase over-the-counter melatonin to 1 mg daily at bedtime -Continue lisinopril 20 mg daily  [name] MD [facility] at [facility]

## 2016-09-06 NOTE — Patient Instructions (Signed)
We have made referral to Dr Noemi Chapel regarding your knee effusion.

## 2016-09-07 DIAGNOSIS — S83242A Other tear of medial meniscus, current injury, left knee, initial encounter: Secondary | ICD-10-CM | POA: Diagnosis not present

## 2016-09-15 ENCOUNTER — Ambulatory Visit: Payer: Medicare Other | Admitting: Family Medicine

## 2016-09-28 DIAGNOSIS — M1712 Unilateral primary osteoarthritis, left knee: Secondary | ICD-10-CM | POA: Diagnosis not present

## 2016-10-05 DIAGNOSIS — M1712 Unilateral primary osteoarthritis, left knee: Secondary | ICD-10-CM | POA: Diagnosis not present

## 2016-10-12 DIAGNOSIS — M1712 Unilateral primary osteoarthritis, left knee: Secondary | ICD-10-CM | POA: Diagnosis not present

## 2016-10-22 ENCOUNTER — Ambulatory Visit (INDEPENDENT_AMBULATORY_CARE_PROVIDER_SITE_OTHER): Payer: Medicare Other

## 2016-10-22 DIAGNOSIS — Z23 Encounter for immunization: Secondary | ICD-10-CM | POA: Diagnosis not present

## 2016-11-22 ENCOUNTER — Ambulatory Visit: Payer: Medicare Other | Admitting: Family Medicine

## 2016-11-22 ENCOUNTER — Encounter: Payer: Self-pay | Admitting: Family Medicine

## 2016-11-22 VITALS — BP 120/80 | HR 80 | Temp 97.8°F | Wt 183.3 lb

## 2016-11-22 DIAGNOSIS — J986 Disorders of diaphragm: Secondary | ICD-10-CM

## 2016-11-22 DIAGNOSIS — I10 Essential (primary) hypertension: Secondary | ICD-10-CM | POA: Diagnosis not present

## 2016-11-22 DIAGNOSIS — R05 Cough: Secondary | ICD-10-CM

## 2016-11-22 DIAGNOSIS — F5104 Psychophysiologic insomnia: Secondary | ICD-10-CM | POA: Diagnosis not present

## 2016-11-22 DIAGNOSIS — R059 Cough, unspecified: Secondary | ICD-10-CM

## 2016-11-22 MED ORDER — LOSARTAN POTASSIUM 100 MG PO TABS: 100.0000 mg | ORAL_TABLET | Freq: Every day | ORAL | 6 refills | Status: DC

## 2016-11-22 NOTE — Patient Instructions (Signed)
Stop the Lisinopril and start Losartan 100 mg once daily. Let's plan on follow up in 2 weeks Set up Medicare Annual Wellness visit Try increasing the Melatonin to 2 mg at night.

## 2016-11-22 NOTE — Progress Notes (Signed)
Subjective:     Patient ID: David Howard, male   DOB: 1923-09-14, 83 y.o.   MRN: [id number]  HPI Patient seen for medical follow-up accompanied by wife with several issues as follows  He has chronic insomnia. Some of this related to his nocturia. Started melatonin couple months ago currently taking 1 mg at night and this has not helped much. Wife had questions whether he could titrate this up to 2 mg.  He has hypertension treated with lisinopril 20 mg daily. Blood pressure been stable. He does have dry cough which wife states he's had for months if not years. He is not aware of any obvious reflux symptoms. No postnasal drip symptoms. He's had poor appetite and general but no significant weight change. No hemoptysis.  Wife has concerns with his driving. He has not actually been driving now for several months. They have someone whose transporting him. He is insisting that he is okay to drive short distances.  Past Medical History:  Diagnosis Date  . Benign neoplasm of colon   . Diverticulosis of colon (without mention of hemorrhage)   . Elevated prostate specific antigen (PSA)    BPH, follows with uro  . Esophageal reflux   . Hearing loss    L>R, follows with ENT, wears hearing aides  . Inguinal hernia without mention of obstruction or gangrene, unilateral or unspecified, (not specified as recurrent)   . Internal hemorrhoids without mention of complication   . Malignant neoplasm of bladder, part unspecified   . Other abnormal glucose   . Peripheral vascular disease, unspecified ([place])   . Personal history of other lymphatic and hematopoietic neoplasm 2003   Primary cutaneous B-cell lymphoma, s/p xrt  . Spinal stenosis, unspecified region other than cervical    R>L LE pain  . Unspecified essential hypertension    Past Surgical History:  Procedure Laterality Date  . CATARACT EXTRACTION    . LAPAROSCOPY    . PILONIDAL CYST / SINUS EXCISION    . TONSILLECTOMY      reports that he has  quit smoking. His smoking use included cigarettes. he has never used smokeless tobacco. He reports that he drinks alcohol. He reports that he does not use drugs. family history includes Kidney disease in his father. No Known Allergies   Review of Systems  Constitutional: Negative for fatigue.  Eyes: Negative for visual disturbance.  Respiratory: Positive for cough. Negative for chest tightness and shortness of breath.   Cardiovascular: Negative for chest pain, palpitations and leg swelling.  Neurological: Negative for dizziness, syncope, weakness, light-headedness and headaches.  Psychiatric/Behavioral: Positive for sleep disturbance. Negative for dysphoric mood.       Objective:   Physical Exam  Constitutional: He appears well-developed and well-nourished.  Neck: Normal range of motion. Neck supple.  Cardiovascular: Normal rate and regular rhythm.  Pulmonary/Chest: Effort normal.  Diminished breath sounds left base compared to right. Previous history of elevated left hemidiaphragm  Musculoskeletal: He exhibits no edema.  Neurological: He is alert.       Assessment:     #1 hypertension stable and at goal  #2 dry cough possibly related ACE inhibitor  #3 history of chronic sleep disturbance  #4 history of elevated left hemidiaphragm probably accounting for decreased aeration left base    Plan:     -Stop lisinopril and change to losartan 100 mg once daily -Follow-up to reassess blood pressure and 2 weeks -Increased melatonin 2 mg daily at bedtime -Set up Medicare wellness visit -We  recommend they consider a driving test to help sort out his capability of driving  David Post MD [facility] at [facility]

## 2016-12-08 ENCOUNTER — Encounter: Payer: Self-pay | Admitting: Family Medicine

## 2016-12-08 ENCOUNTER — Ambulatory Visit (INDEPENDENT_AMBULATORY_CARE_PROVIDER_SITE_OTHER): Payer: Medicare Other | Admitting: Family Medicine

## 2016-12-08 VITALS — BP 138/80 | HR 78 | Temp 97.8°F | Wt 179.9 lb

## 2016-12-08 DIAGNOSIS — I10 Essential (primary) hypertension: Secondary | ICD-10-CM | POA: Diagnosis not present

## 2016-12-08 NOTE — Progress Notes (Signed)
Subjective:     Patient ID: David Howard, male   DOB: 1923-12-29, 83 y.o.   MRN: [id number]  HPI Patient seen for follow-up regarding blood pressure issues. He had some chronic dry cough and lisinopril was changed to losartan. He denies any dizziness or headaches. No side effects. Still has occasional dry cough but wife and patient thinks this might be slightly better since change of medication.  Past Medical History:  Diagnosis Date  . Benign neoplasm of colon   . Diverticulosis of colon (without mention of hemorrhage)   . Elevated prostate specific antigen (PSA)    BPH, follows with uro  . Esophageal reflux   . Hearing loss    L>R, follows with ENT, wears hearing aides  . Inguinal hernia without mention of obstruction or gangrene, unilateral or unspecified, (not specified as recurrent)   . Internal hemorrhoids without mention of complication   . Malignant neoplasm of bladder, part unspecified   . Other abnormal glucose   . Peripheral vascular disease, unspecified ([place])   . Personal history of other lymphatic and hematopoietic neoplasm 2003   Primary cutaneous B-cell lymphoma, s/p xrt  . Spinal stenosis, unspecified region other than cervical    R>L LE pain  . Unspecified essential hypertension    Past Surgical History:  Procedure Laterality Date  . CATARACT EXTRACTION    . LAPAROSCOPY    . PILONIDAL CYST / SINUS EXCISION    . TONSILLECTOMY      reports that he has quit smoking. His smoking use included cigarettes. he has never used smokeless tobacco. He reports that he drinks alcohol. He reports that he does not use drugs. family history includes Kidney disease in his father. No Known Allergies   Review of Systems  Constitutional: Negative for fatigue.  Eyes: Negative for visual disturbance.  Respiratory: Negative for cough, chest tightness and shortness of breath.   Cardiovascular: Negative for chest pain, palpitations and leg swelling.  Neurological: Negative for  dizziness, syncope, weakness, light-headedness and headaches.       Objective:   Physical Exam  Constitutional: He is oriented to person, place, and time. He appears well-developed and well-nourished.  HENT:  Right Ear: External ear normal.  Left Ear: External ear normal.  Mouth/Throat: Oropharynx is clear and moist.  Eyes: Pupils are equal, round, and reactive to light.  Neck: Neck supple. No thyromegaly present.  Cardiovascular: Normal rate and regular rhythm.  Pulmonary/Chest: Effort normal and breath sounds normal. No respiratory distress. He has no wheezes. He has no rales.  Musculoskeletal: He exhibits no edema.  Neurological: He is alert and oriented to person, place, and time.       Assessment:     Hypertension stable by repeat reading. Cough possibly improved with change from lisinopril to angiotensin receptor blocker    Plan:     -Continue current medication -Be in touch if blood pressure consistently greater than 140/90  [name] MD [facility] at [facility]

## 2017-01-03 ENCOUNTER — Telehealth: Payer: Self-pay | Admitting: Family Medicine

## 2017-01-03 NOTE — Telephone Encounter (Signed)
I have already discussed with wife our concerns about his driving.  We cannot discuss with brother (of course) unless he is listed as someone we can disclose information to- secondary to HIPPA issues.  If pt insists on driving- and family does not feel he is capable, my recommendation is that he be evaluated by driver evaluation OT specialist.

## 2017-01-03 NOTE — Telephone Encounter (Signed)
Spoke with son and will get more information and call back.

## 2017-01-03 NOTE — Telephone Encounter (Signed)
Copied from [place] [id number]. Topic: General - Other >> Jan 03, 2017 11:39 AM Darl Householder, RMA wrote: Reason for CRM: patient's brother breton berns wants to set up validate that patient is safe to drive , please call patient's brother [name] at [phone]

## 2017-01-04 ENCOUNTER — Encounter: Payer: Self-pay | Admitting: Family Medicine

## 2017-01-04 ENCOUNTER — Ambulatory Visit: Payer: Self-pay | Admitting: *Deleted

## 2017-01-04 ENCOUNTER — Ambulatory Visit (INDEPENDENT_AMBULATORY_CARE_PROVIDER_SITE_OTHER): Payer: Medicare Other | Admitting: Family Medicine

## 2017-01-04 VITALS — BP 132/80 | HR 83 | Temp 97.4°F | Ht 68.0 in

## 2017-01-04 DIAGNOSIS — T148XXA Other injury of unspecified body region, initial encounter: Secondary | ICD-10-CM

## 2017-01-04 NOTE — Patient Instructions (Signed)
Follow up PCP in 2-3 days for recheck  Wound clean and dry for the next 24 hours  Change dressing daily, sooner if needed  Please be very cautious with ambulation, use walker or cane, keep area as well lit, use caution on slippery floors in the bathroom and kitchen and around drugs

## 2017-01-04 NOTE — Progress Notes (Signed)
HPI:  Acute visit for skin tear L arm: -mechanical fall in bathroom this morning -no LOC, CP, SOB, Palpitations, feeling sick, dizziness, HA, head injury or other injury per pt and wife -on baby asa -wide cleaned wound well and applied dressing - hx prior skin tears so she has plenty of sound care supplies at home  ROS: See pertinent positives and negatives per HPI.  Past Medical History:  Diagnosis Date  . Benign neoplasm of colon   . Diverticulosis of colon (without mention of hemorrhage)   . Elevated prostate specific antigen (PSA)    BPH, follows with uro  . Esophageal reflux   . Hearing loss    L>R, follows with ENT, wears hearing aides  . Inguinal hernia without mention of obstruction or gangrene, unilateral or unspecified, (not specified as recurrent)   . Internal hemorrhoids without mention of complication   . Malignant neoplasm of bladder, part unspecified   . Other abnormal glucose   . Peripheral vascular disease, unspecified ([place])   . Personal history of other lymphatic and hematopoietic neoplasm 2003   Primary cutaneous B-cell lymphoma, s/p xrt  . Spinal stenosis, unspecified region other than cervical    R>L LE pain  . Unspecified essential hypertension     Past Surgical History:  Procedure Laterality Date  . CATARACT EXTRACTION    . LAPAROSCOPY    . PILONIDAL CYST / SINUS EXCISION    . TONSILLECTOMY      Family History  Problem Relation Age of Onset  . Kidney disease Father     Social History   Socioeconomic History  . Marital status: Married    Spouse name: zelda Howard  . Number of children: None  . Years of education: None  . Highest education level: None  Social Needs  . Financial resource strain: None  . Food insecurity - worry: None  . Food insecurity - inability: None  . Transportation needs - medical: None  . Transportation needs - non-medical: None  Occupational History  . Occupation: retired  . Occupation: Manufacturing engineer:  [employer]  Tobacco Use  . Smoking status: Former Smoker    Types: Cigarettes  . Smokeless tobacco: Never Used  Substance and Sexual Activity  . Alcohol use: Yes    Comment: social use  . Drug use: No  . Sexual activity: None  Other Topics Concern  . None  Social History Narrative  . None     Current Outpatient Medications:  .  aspirin 81 MG tablet, Take 81 mg by mouth every other day. , Disp: , Rfl:  .  Cholecalciferol (VITAMIN D3) 1000 UNITS CAPS, Take 1 capsule by mouth daily., Disp: , Rfl:  .  finasteride (PROSCAR) 5 MG tablet, TAKE 1 TABLET ONCE DAILY., Disp: 90 tablet, Rfl: 3 .  losartan (COZAAR) 100 MG tablet, Take 1 tablet (100 mg total) daily by mouth., Disp: 30 tablet, Rfl: 6 .  Melatonin 1 MG TABS, Take 0.5 mg by mouth at bedtime., Disp: , Rfl:  .  mirabegron ER (MYRBETRIQ) 25 MG TB24 tablet, Take 25 mg by mouth daily., Disp: , Rfl:  .  Multiple Vitamin (MULTIVITAMIN) capsule, Take 1 capsule by mouth daily.  , Disp: , Rfl:  .  Omega-3 Fatty Acids (FISH OIL) 1000 MG CAPS, Take 1 capsule by mouth every other day. , Disp: , Rfl:  .  psyllium (HYDROCIL/METAMUCIL) 95 % PACK, Take 1 packet by mouth daily., Disp: , Rfl:   EXAM:  Vitals:   01/04/17 1256  BP: 132/80  Pulse: 83  Temp: (!) 97.4 F (36.3 C)  SpO2: 98%    Body mass index is 27.35 kg/m.  GENERAL: vitals reviewed and listed above, alert, oriented, appears well hydrated and in no acute distress  HEENT: atraumatic, conjunttiva clear, no obvious abnormalities on inspection of external nose and ears, hearing aides - hard of hearing  NECK: no obvious masses on inspection  LUNGS: clear to auscultation bilaterally, no wheezes, rales or rhonchi, good air movement  CV: HRRR  MS: moves all extremities without noticeable abnormality, walks with walker, cuatious  SKIN: several superficial skin tears L arm, minimal bleeding with removal bandage  PSYCH: pleasant and cooperative, no obvious  depression or anxiety  ASSESSMENT AND PLAN:  Discussed the following assessment and plan:  Wound of skin  -wound debrided - removed very thin skin flap one tear -hemostasis achieved with pressure -would dressed and wound care recs provided -fall precuations -follow up recheck PCP 2-5 days  -Patient advised to return or notify a doctor immediately if symptoms worsen or persist or new concerns arise.  Patient Instructions  Follow up PCP in 2-3 days for recheck  Wound clean and dry for the next 24 hours  Change dressing daily, sooner if needed  Please be very cautious with ambulation, use walker or cane, keep area as well lit, use caution on slippery floors in the bathroom and kitchen and around drugs     David Benton R., DO

## 2017-01-04 NOTE — Telephone Encounter (Signed)
Pt's wife called letting me know she heard him fall in the bathroom this morning.  She heard the fall and immediately came into the bathroom within seconds of it happening.   He was "perfectly lucid and everything".   "So I don't think he fainted because I would have seen it being I got there right away.  He usuall have a banana and juice when he gets up but had not had that this morning before he fell.    He got a "skin tear" on his left elbow but otherwise not c/o pain and movement is fine. Wife is also concerned his BP.  His BP medication was changed 2 wks ago and she wants it checked.  They have a BP machine but she doesn't know how to use it when I asked her to take his BP while I was on the phone with her. I scheduled an appt with Dr. Maudie Mercury for today at 1:00. Instructed her to call back if his condition changed.   She verbalized understanding.  Reason for Disposition . [1] High-risk adult (e.g., age > 81, osteoporosis, chronic steroid use) AND [2] still hurts  Answer Assessment - Initial Assessment Questions 1. MECHANISM: "How did the injury happen?"     He fell in the bathroom this morning.   He did not pass out.   The skin is very thin and tears easily. 2. ONSET: "When did the injury happen?" (Minutes or hours ago)      This morning 3. LOCATION: "What part of the elbow is the injured?"      Left elbow 4. APPEARANCE of INJURY: "What does the injury look like?"      Skin tear 5. SEVERITY: "Can you use the elbow normally?"  "Can you bend it and straighten it fully?"     Yes 6. SIZE: For cuts, bruises, or swelling, ask: "How large is it?" (e.g., inches or centimeters; entire joint)      The skin is torn.  I cleaned it with a solution that was left over from when the home care nurse was coming to heal his hand.   I put a Xeroform occlusive drsg on it. 7. PAIN: "Is there pain?" If so, ask: "How bad is the pain?"    (Scale 1-10; or mild, moderate, severe)     No pain 8. TETANUS: For any breaks  in the skin, ask: "When was the last tetanus booster?"     I think he is up to date on all his shots. 9. OTHER SYMPTOMS: "Do you have any other symptoms?"  (e.g., numbness in hand)     No 10. PREGNANCY: "Is there any chance you are pregnant?" "When was your last menstrual period?"       N/A  Protocols used: ELBOW INJURY-A-AH

## 2017-01-04 NOTE — Telephone Encounter (Signed)
Spoke to son and information given.

## 2017-01-07 ENCOUNTER — Ambulatory Visit: Payer: Medicare Other | Admitting: Family Medicine

## 2017-01-07 ENCOUNTER — Encounter: Payer: Self-pay | Admitting: Family Medicine

## 2017-01-07 VITALS — BP 120/82 | HR 72 | Temp 97.7°F | Ht 68.0 in | Wt 181.1 lb

## 2017-01-07 DIAGNOSIS — S51012D Laceration without foreign body of left elbow, subsequent encounter: Secondary | ICD-10-CM | POA: Diagnosis not present

## 2017-01-07 NOTE — Progress Notes (Signed)
Subjective:     Patient ID: David Howard, male   DOB: [birth year], 83 y.o.   MRN: [id number]  HPI Patient seen for follow-up regarding skin tear left elbow. He was seen here a few days ago after falling in the bathroom. There was no definite loss of consciousness. He's had history of falls and bathroom in the past. He had skin tear left elbow and had some debridement here on initial visit. His wife has been caring for this at home. He has some difficulties with transfers and ambulates with walker and he would like to see if they can get home health to come out and assess. He's had no further falls since then. No headache. No increased confusion.  No other injuries reported other than the fact that his wife noticed some bruising lower back region. He denies any back pain. No hip pain.  Past Medical History:  Diagnosis Date  . Benign neoplasm of colon   . Diverticulosis of colon (without mention of hemorrhage)   . Elevated prostate specific antigen (PSA)    BPH, follows with uro  . Esophageal reflux   . Hearing loss    L>R, follows with ENT, wears hearing aides  . Inguinal hernia without mention of obstruction or gangrene, unilateral or unspecified, (not specified as recurrent)   . Internal hemorrhoids without mention of complication   . Malignant neoplasm of bladder, part unspecified   . Other abnormal glucose   . Peripheral vascular disease, unspecified ([place])   . Personal history of other lymphatic and hematopoietic neoplasm 2003   Primary cutaneous B-cell lymphoma, s/p xrt  . Spinal stenosis, unspecified region other than cervical    R>L LE pain  . Unspecified essential hypertension    Past Surgical History:  Procedure Laterality Date  . CATARACT EXTRACTION    . LAPAROSCOPY    . PILONIDAL CYST / SINUS EXCISION    . TONSILLECTOMY      reports that he has quit smoking. His smoking use included cigarettes. he has never used smokeless tobacco. He reports that he drinks alcohol. He  reports that he does not use drugs. family history includes Kidney disease in his father. No Known Allergies   Review of Systems  Constitutional: Negative for chills and fever.       Objective:   Physical Exam  Constitutional: He appears well-developed and well-nourished.  Cardiovascular: Normal rate.  Pulmonary/Chest: Effort normal and breath sounds normal. No respiratory distress. He has no wheezes. He has no rales.  Musculoskeletal:  He is a mild tenderness over the distal olecranon process but good range of motion in elbow. No proximal radial head tenderness  No pain with supination or pronation.  Skin:  Has some mild bruising lower lumbar area but no spinal tenderness. No localized tenderness.  Left elbow reveals skin tear. No active bleeding. He has some surrounding bruising but no erythema. No warmth.       Assessment:     Recent fall with skin tear left elbow. No signs of secondary infection    Plan:     -Wound is cleaned and redressed today -Set up home health referral -His tetanus is up-to-date -Reviewed signs and symptoms of infection to look out for.  [name] MD [facility] at [facility]

## 2017-01-07 NOTE — Patient Instructions (Signed)
Watch for any signs of infection such as redness, warmth, or pus like drainage.

## 2017-01-10 ENCOUNTER — Telehealth: Payer: Self-pay | Admitting: Family Medicine

## 2017-01-10 DIAGNOSIS — N401 Enlarged prostate with lower urinary tract symptoms: Secondary | ICD-10-CM | POA: Diagnosis not present

## 2017-01-10 DIAGNOSIS — R262 Difficulty in walking, not elsewhere classified: Secondary | ICD-10-CM | POA: Diagnosis not present

## 2017-01-10 DIAGNOSIS — Z9181 History of falling: Secondary | ICD-10-CM | POA: Diagnosis not present

## 2017-01-10 DIAGNOSIS — S51012D Laceration without foreign body of left elbow, subsequent encounter: Secondary | ICD-10-CM | POA: Diagnosis not present

## 2017-01-10 NOTE — Telephone Encounter (Signed)
Demographics sheet faxed to [phone].

## 2017-01-10 NOTE — Telephone Encounter (Signed)
Copied from [place] [phone]. Topic: Inquiry >> Jan 10, 2017  9:00 AM Ether Griffins B wrote: Reason for CRM: Beth with [facility] home health. Wanting to start nursing care today and have referral in only thing needed is the face sheet with insurance on it. Please fax to [phone]

## 2017-01-12 ENCOUNTER — Telehealth: Payer: Self-pay | Admitting: Family Medicine

## 2017-01-12 DIAGNOSIS — Z9181 History of falling: Secondary | ICD-10-CM | POA: Diagnosis not present

## 2017-01-12 DIAGNOSIS — R262 Difficulty in walking, not elsewhere classified: Secondary | ICD-10-CM | POA: Diagnosis not present

## 2017-01-12 DIAGNOSIS — S51012D Laceration without foreign body of left elbow, subsequent encounter: Secondary | ICD-10-CM | POA: Diagnosis not present

## 2017-01-12 NOTE — Telephone Encounter (Signed)
OK to take Tylenol and also OK to order Home PT evaluation.

## 2017-01-12 NOTE — Telephone Encounter (Signed)
Sir please see below message. Please advise. 

## 2017-01-12 NOTE — Telephone Encounter (Signed)
Copied from [place] [id number]. Topic: Inquiry >> Jan 12, 2017  1:11 PM Scherrie Gerlach wrote: Reason for CRM: Langley Gauss w/ [facility] states pt has new onset of lower back pain. Langley Gauss would like an order for pt that is ok for pt to take Tylenol Langley Gauss is doing wound care for skin tear, and also needs the verbal for PT

## 2017-01-12 NOTE — Telephone Encounter (Signed)
Copied from [place]. Topic: Medical Record Request - Provider/Facility Request >> Jan 12, 2017  2:46 PM [name], NT wrote: Patient Name/DOB/MRN #: David Howard,David [id number] Requestor Name/Agency: Santiago Glad from [facility] Call Back #: [phone]  Fax#: [phone] Information Requested: [place] office note and face to face form (that is being faxed to the office) from visit on 01/07/17.    Route to [employer] for [employer]. For all other clinics, route to the clinic's PEC Pool.

## 2017-01-13 DIAGNOSIS — R262 Difficulty in walking, not elsewhere classified: Secondary | ICD-10-CM | POA: Diagnosis not present

## 2017-01-13 DIAGNOSIS — Z9181 History of falling: Secondary | ICD-10-CM | POA: Diagnosis not present

## 2017-01-13 DIAGNOSIS — S51012D Laceration without foreign body of left elbow, subsequent encounter: Secondary | ICD-10-CM | POA: Diagnosis not present

## 2017-01-13 NOTE — Telephone Encounter (Signed)
Spoke with Langley Gauss from [facility] and gave the okay per Dr. Elease Hashimoto for patient to take tylenol and for the home PT evaluation. Nothing further needed.

## 2017-01-14 DIAGNOSIS — S51012D Laceration without foreign body of left elbow, subsequent encounter: Secondary | ICD-10-CM | POA: Diagnosis not present

## 2017-01-14 DIAGNOSIS — Z9181 History of falling: Secondary | ICD-10-CM | POA: Diagnosis not present

## 2017-01-14 DIAGNOSIS — R262 Difficulty in walking, not elsewhere classified: Secondary | ICD-10-CM | POA: Diagnosis not present

## 2017-01-17 ENCOUNTER — Telehealth: Payer: Self-pay | Admitting: Family Medicine

## 2017-01-17 DIAGNOSIS — R262 Difficulty in walking, not elsewhere classified: Secondary | ICD-10-CM | POA: Diagnosis not present

## 2017-01-17 DIAGNOSIS — Z9181 History of falling: Secondary | ICD-10-CM | POA: Diagnosis not present

## 2017-01-17 DIAGNOSIS — S51012D Laceration without foreign body of left elbow, subsequent encounter: Secondary | ICD-10-CM | POA: Diagnosis not present

## 2017-01-17 NOTE — Telephone Encounter (Signed)
Form placed on Dr [employer] desk

## 2017-01-17 NOTE — Telephone Encounter (Signed)
ok 

## 2017-01-17 NOTE — Telephone Encounter (Signed)
Copied from [place] [phone]. Topic: Quick Communication - See Telephone Encounter >> Jan 17, 2017  9:03 AM [place], Laban Emperor wrote: CRM for notification. See Telephone encounter for:  Liji with [facility] calling for verbal orders: home health PT once a for 1 week, twice a week for 6 weeks and once a week for 1 week.  Contact number [phone] 01/17/17.

## 2017-01-17 NOTE — Telephone Encounter (Signed)
done

## 2017-01-17 NOTE — Telephone Encounter (Signed)
Form faxed and confirmed

## 2017-01-19 ENCOUNTER — Telehealth: Payer: Self-pay | Admitting: Family Medicine

## 2017-01-19 DIAGNOSIS — Z8551 Personal history of malignant neoplasm of bladder: Secondary | ICD-10-CM | POA: Diagnosis not present

## 2017-01-19 DIAGNOSIS — I10 Essential (primary) hypertension: Secondary | ICD-10-CM | POA: Diagnosis not present

## 2017-01-19 DIAGNOSIS — Z87891 Personal history of nicotine dependence: Secondary | ICD-10-CM | POA: Diagnosis not present

## 2017-01-19 DIAGNOSIS — M4800 Spinal stenosis, site unspecified: Secondary | ICD-10-CM | POA: Diagnosis not present

## 2017-01-19 DIAGNOSIS — Z8572 Personal history of non-Hodgkin lymphomas: Secondary | ICD-10-CM | POA: Diagnosis not present

## 2017-01-19 DIAGNOSIS — K5730 Diverticulosis of large intestine without perforation or abscess without bleeding: Secondary | ICD-10-CM | POA: Diagnosis not present

## 2017-01-19 DIAGNOSIS — R55 Syncope and collapse: Secondary | ICD-10-CM | POA: Diagnosis not present

## 2017-01-19 DIAGNOSIS — Z9181 History of falling: Secondary | ICD-10-CM | POA: Diagnosis not present

## 2017-01-19 DIAGNOSIS — S51012D Laceration without foreign body of left elbow, subsequent encounter: Secondary | ICD-10-CM | POA: Diagnosis not present

## 2017-01-19 DIAGNOSIS — R262 Difficulty in walking, not elsewhere classified: Secondary | ICD-10-CM | POA: Diagnosis not present

## 2017-01-19 DIAGNOSIS — H9193 Unspecified hearing loss, bilateral: Secondary | ICD-10-CM | POA: Diagnosis not present

## 2017-01-19 DIAGNOSIS — I739 Peripheral vascular disease, unspecified: Secondary | ICD-10-CM | POA: Diagnosis not present

## 2017-01-19 NOTE — Telephone Encounter (Signed)
David Howard is sending over orders for PT and nursing [phone], Caryl Pina

## 2017-01-19 NOTE — Telephone Encounter (Signed)
Copied from [place] [phone]. Topic: General - Other >> Jan 17, 2017  2:35 PM Lennox Solders wrote: Reason for VOZ:DGUYQI rn [facility] is calling to let md know she will leave patient wound skin tear open. Langley Gauss will end nursing on thur  01-20-17 or fri 01-21-17

## 2017-01-19 NOTE — Telephone Encounter (Signed)
Verbal orders given  

## 2017-01-19 NOTE — Telephone Encounter (Signed)
ok 

## 2017-01-20 NOTE — Telephone Encounter (Signed)
Verbal orders given for PT. 

## 2017-01-21 DIAGNOSIS — S51012D Laceration without foreign body of left elbow, subsequent encounter: Secondary | ICD-10-CM | POA: Diagnosis not present

## 2017-01-21 DIAGNOSIS — Z8572 Personal history of non-Hodgkin lymphomas: Secondary | ICD-10-CM | POA: Diagnosis not present

## 2017-01-21 DIAGNOSIS — I739 Peripheral vascular disease, unspecified: Secondary | ICD-10-CM | POA: Diagnosis not present

## 2017-01-21 DIAGNOSIS — Z9181 History of falling: Secondary | ICD-10-CM | POA: Diagnosis not present

## 2017-01-21 DIAGNOSIS — R55 Syncope and collapse: Secondary | ICD-10-CM | POA: Diagnosis not present

## 2017-01-21 DIAGNOSIS — R262 Difficulty in walking, not elsewhere classified: Secondary | ICD-10-CM | POA: Diagnosis not present

## 2017-01-21 DIAGNOSIS — I10 Essential (primary) hypertension: Secondary | ICD-10-CM | POA: Diagnosis not present

## 2017-01-21 DIAGNOSIS — Z8551 Personal history of malignant neoplasm of bladder: Secondary | ICD-10-CM | POA: Diagnosis not present

## 2017-01-21 DIAGNOSIS — M4800 Spinal stenosis, site unspecified: Secondary | ICD-10-CM | POA: Diagnosis not present

## 2017-01-21 DIAGNOSIS — K5730 Diverticulosis of large intestine without perforation or abscess without bleeding: Secondary | ICD-10-CM | POA: Diagnosis not present

## 2017-01-21 DIAGNOSIS — Z87891 Personal history of nicotine dependence: Secondary | ICD-10-CM | POA: Diagnosis not present

## 2017-01-21 DIAGNOSIS — H9193 Unspecified hearing loss, bilateral: Secondary | ICD-10-CM | POA: Diagnosis not present

## 2017-01-25 DIAGNOSIS — M4800 Spinal stenosis, site unspecified: Secondary | ICD-10-CM | POA: Diagnosis not present

## 2017-01-25 DIAGNOSIS — R55 Syncope and collapse: Secondary | ICD-10-CM | POA: Diagnosis not present

## 2017-01-25 DIAGNOSIS — I10 Essential (primary) hypertension: Secondary | ICD-10-CM | POA: Diagnosis not present

## 2017-01-25 DIAGNOSIS — Z9181 History of falling: Secondary | ICD-10-CM | POA: Diagnosis not present

## 2017-01-25 DIAGNOSIS — I739 Peripheral vascular disease, unspecified: Secondary | ICD-10-CM | POA: Diagnosis not present

## 2017-01-25 DIAGNOSIS — R262 Difficulty in walking, not elsewhere classified: Secondary | ICD-10-CM | POA: Diagnosis not present

## 2017-01-25 DIAGNOSIS — K5730 Diverticulosis of large intestine without perforation or abscess without bleeding: Secondary | ICD-10-CM | POA: Diagnosis not present

## 2017-01-25 DIAGNOSIS — S51012D Laceration without foreign body of left elbow, subsequent encounter: Secondary | ICD-10-CM | POA: Diagnosis not present

## 2017-01-25 DIAGNOSIS — Z8551 Personal history of malignant neoplasm of bladder: Secondary | ICD-10-CM | POA: Diagnosis not present

## 2017-01-25 DIAGNOSIS — Z8572 Personal history of non-Hodgkin lymphomas: Secondary | ICD-10-CM | POA: Diagnosis not present

## 2017-01-25 DIAGNOSIS — Z87891 Personal history of nicotine dependence: Secondary | ICD-10-CM | POA: Diagnosis not present

## 2017-01-25 DIAGNOSIS — H9193 Unspecified hearing loss, bilateral: Secondary | ICD-10-CM | POA: Diagnosis not present

## 2017-01-26 ENCOUNTER — Telehealth: Payer: Self-pay | Admitting: Family Medicine

## 2017-01-26 NOTE — Telephone Encounter (Signed)
Copied from [place] [phone]. Topic: General - Other >> Jan 26, 2017  4:48 PM [name], NT wrote: Reason for CRM: David Howard is calling from [facility] and states patient is refusing home health PT services.   [phone]

## 2017-01-26 NOTE — Telephone Encounter (Signed)
OK 

## 2017-02-07 ENCOUNTER — Ambulatory Visit (INDEPENDENT_AMBULATORY_CARE_PROVIDER_SITE_OTHER): Payer: Medicare Other | Admitting: Family Medicine

## 2017-02-07 ENCOUNTER — Encounter: Payer: Self-pay | Admitting: Family Medicine

## 2017-02-07 VITALS — BP 130/70 | HR 76 | Temp 98.6°F | Ht 68.0 in | Wt 179.9 lb

## 2017-02-07 DIAGNOSIS — R5383 Other fatigue: Secondary | ICD-10-CM

## 2017-02-07 DIAGNOSIS — R3915 Urgency of urination: Secondary | ICD-10-CM | POA: Diagnosis not present

## 2017-02-07 DIAGNOSIS — Z9181 History of falling: Secondary | ICD-10-CM | POA: Diagnosis not present

## 2017-02-07 DIAGNOSIS — N400 Enlarged prostate without lower urinary tract symptoms: Secondary | ICD-10-CM

## 2017-02-07 DIAGNOSIS — I10 Essential (primary) hypertension: Secondary | ICD-10-CM | POA: Diagnosis not present

## 2017-02-07 NOTE — Progress Notes (Signed)
Subjective:     Patient ID: David Howard, male   DOB: 1923/04/25, 84 y.o.   MRN: [id number]  HPI Patient seen for routine medical follow-up.  Hypertension treated losartan. He had been on ACE inhibitor but had some cough and since switching to losartan his cough is resolved. His blood pressures remain stable  History of BPH. Has been followed by urology. Remains on finasteride. Denies obstructive symptoms. He also has urinary urgency has been on tried on multiple medications per urology without much benefit. Gets up several times at night. Avoids caffeine late in the day and also tries not to drink anything after 6 PM.  Increased fatigue which they think is related to frequent sleep disruption. Currently takes melatonin 2 mg at night. Wife states he is taking frequent daytime naps. He had multiple labs last May including B12 and thyroid which were normal  History of recurrent falls. He does use walker all times. Recently refused home PT  Past Medical History:  Diagnosis Date  . Benign neoplasm of colon   . Diverticulosis of colon (without mention of hemorrhage)   . Elevated prostate specific antigen (PSA)    BPH, follows with uro  . Esophageal reflux   . Hearing loss    L>R, follows with ENT, wears hearing aides  . Inguinal hernia without mention of obstruction or gangrene, unilateral or unspecified, (not specified as recurrent)   . Internal hemorrhoids without mention of complication   . Malignant neoplasm of bladder, part unspecified   . Other abnormal glucose   . Peripheral vascular disease, unspecified ([place])   . Personal history of other lymphatic and hematopoietic neoplasm 2003   Primary cutaneous B-cell lymphoma, s/p xrt  . Spinal stenosis, unspecified region other than cervical    R>L LE pain  . Unspecified essential hypertension    Past Surgical History:  Procedure Laterality Date  . CATARACT EXTRACTION    . LAPAROSCOPY    . PILONIDAL CYST / SINUS EXCISION    .  TONSILLECTOMY      reports that he has quit smoking. His smoking use included cigarettes. he has never used smokeless tobacco. He reports that he drinks alcohol. He reports that he does not use drugs. family history includes Kidney disease in his father. No Known Allergies   Review of Systems  Constitutional: Negative for appetite change, fatigue and unexpected weight change.  Eyes: Negative for visual disturbance.  Respiratory: Negative for cough, chest tightness and shortness of breath.   Cardiovascular: Negative for chest pain, palpitations and leg swelling.  Endocrine: Negative for polydipsia and polyuria.  Genitourinary: Negative for dysuria.  Neurological: Negative for dizziness, syncope, weakness, light-headedness and headaches.       Objective:   Physical Exam  Constitutional: He is oriented to person, place, and time. He appears well-developed and well-nourished.  HENT:  Right Ear: External ear normal.  Left Ear: External ear normal.  Mouth/Throat: Oropharynx is clear and moist.  Eyes: Pupils are equal, round, and reactive to light.  Neck: Neck supple. No thyromegaly present.  Cardiovascular: Normal rate and regular rhythm.  Pulmonary/Chest: Effort normal and breath sounds normal. No respiratory distress. He has no wheezes. He has no rales.  Musculoskeletal:  Only trace edema lower legs bilaterally  Neurological: He is alert and oriented to person, place, and time.       Assessment:     #1 hypertension stable and at goal  #2 BPH symptomatically stable  #3 urinary urgency  #4 chronic insomnia  #  5 fatigue probably related to frequent sleep disruption    Plan:     -Continue current medications -May titrate melatonin up to 3 mg at night -Avoid daytime napping -He apparently has been on anticholinergic medications in the past but did not tolerate side effects -Routine follow-up in 3 months and sooner as needed  [name] MD [facility] at  [facility]

## 2017-02-07 NOTE — Patient Instructions (Signed)
Make increase the Melatonin to 3 mg once at night.

## 2017-03-08 ENCOUNTER — Telehealth: Payer: Self-pay | Admitting: Family Medicine

## 2017-03-08 MED ORDER — TRAZODONE HCL 50 MG PO TABS: ORAL_TABLET | ORAL | 1 refills | Status: DC

## 2017-03-08 NOTE — Telephone Encounter (Signed)
Spoke with wife and Rx sent.

## 2017-03-08 NOTE — Telephone Encounter (Signed)
Copied from [place]. Topic: Quick Communication - See Telephone Encounter >> Mar 08, 2017 11:07 AM Margot Ables wrote: CRM for notification. See Telephone encounter for: 03/08/17.  Pt can fall asleep quickly but doesn't stay asleep. Pt wakes up exhausted. Wife requesting call back from nurse to discuss situation.

## 2017-03-08 NOTE — Telephone Encounter (Signed)
Let's try Trazodone 50 mg - take one half tablet po qhs.  #30 with one refill.   Make sure he has office follow-up within the next month to reassess

## 2017-03-10 ENCOUNTER — Ambulatory Visit: Payer: Self-pay

## 2017-03-10 NOTE — Telephone Encounter (Signed)
   Reason for Disposition . General information question, no triage required and triager able to answer question  Answer Assessment - Initial Assessment Questions 1. REASON FOR CALL or QUESTION: "What is your reason for calling today?" or "How can I best help you?" or "What question do you have that I can help answer?"     Wife called to ask about Trazodone frequency, how long it takes to start working. Wife states pt stated it did not work at all last night. Wife informed that med is as need for sleep, that it takes 30 minutes to start working and it takes a week or two to make sure the dose is correct to get the desired effect. Will forward to office in case provider needs to make a dose adjustment.  Protocols used: INFORMATION ONLY CALL-A-AH

## 2017-03-11 NOTE — Telephone Encounter (Signed)
I called the pt and informed his wife of the message below. 

## 2017-03-11 NOTE — Telephone Encounter (Signed)
They may increase this to one full tablet (50 mg) but they do need to give this at least a couple of weeks.

## 2017-03-19 ENCOUNTER — Other Ambulatory Visit: Payer: Self-pay | Admitting: Family Medicine

## 2017-03-21 DIAGNOSIS — H6123 Impacted cerumen, bilateral: Secondary | ICD-10-CM | POA: Diagnosis not present

## 2017-03-21 DIAGNOSIS — H903 Sensorineural hearing loss, bilateral: Secondary | ICD-10-CM | POA: Diagnosis not present

## 2017-04-04 ENCOUNTER — Ambulatory Visit (INDEPENDENT_AMBULATORY_CARE_PROVIDER_SITE_OTHER): Payer: Medicare Other | Admitting: Family Medicine

## 2017-04-04 ENCOUNTER — Encounter: Payer: Self-pay | Admitting: Family Medicine

## 2017-04-04 VITALS — BP 130/87 | HR 80 | Temp 98.3°F | Wt 180.2 lb

## 2017-04-04 DIAGNOSIS — R4189 Other symptoms and signs involving cognitive functions and awareness: Secondary | ICD-10-CM

## 2017-04-04 DIAGNOSIS — Z9181 History of falling: Secondary | ICD-10-CM | POA: Diagnosis not present

## 2017-04-04 DIAGNOSIS — M25462 Effusion, left knee: Secondary | ICD-10-CM

## 2017-04-04 DIAGNOSIS — F5104 Psychophysiologic insomnia: Secondary | ICD-10-CM

## 2017-04-04 NOTE — Progress Notes (Signed)
Subjective:     Patient ID: David Howard, male   DOB: 12-14-1923, 84 y.o.   MRN: [id number]  HPI Patient has history of peripheral vascular disease, hypertension, osteoarthritis, remote history of bladder cancer, BPH, urinary urgency, spinal stenosis, history of non-Hodgkin's lymphoma, chronic insomnia  Patient has had difficulty falling asleep and staying asleep. Unfortunately though his wife states he still takes frequent daytime naps. He tried some melatonin up to 3 mg without much improvement. We started trazodone 50 mg one half tablet at night and they titrated to 50 mg. He is sleeping slightly better but still somewhat inconsistent. Still gets up frequently at night to urinate. He is followed regularly by urologist. They minimize caffeine intake.  Wife is concerned that he has had some recent confusion which seems to be somewhat worse at night. B12 and thyroid testing last May was normal. No recent head injury. He has very impaired hearing which she thinks exacerbates his confusion at times.  Ongoing problems with left knee pain and left knee effusion. He has seen orthopedist in the past and has had fluid drawn off and apparently had some gel injections without much improvement.  Past Medical History:  Diagnosis Date  . Benign neoplasm of colon   . Diverticulosis of colon (without mention of hemorrhage)   . Elevated prostate specific antigen (PSA)    BPH, follows with uro  . Esophageal reflux   . Hearing loss    L>R, follows with ENT, wears hearing aides  . Inguinal hernia without mention of obstruction or gangrene, unilateral or unspecified, (not specified as recurrent)   . Internal hemorrhoids without mention of complication   . Malignant neoplasm of bladder, part unspecified   . Other abnormal glucose   . Peripheral vascular disease, unspecified ([place])   . Personal history of other lymphatic and hematopoietic neoplasm 2003   Primary cutaneous B-cell lymphoma, s/p xrt  . Spinal  stenosis, unspecified region other than cervical    R>L LE pain  . Unspecified essential hypertension    Past Surgical History:  Procedure Laterality Date  . CATARACT EXTRACTION    . LAPAROSCOPY    . PILONIDAL CYST / SINUS EXCISION    . TONSILLECTOMY      reports that he has quit smoking. His smoking use included cigarettes. he has never used smokeless tobacco. He reports that he drinks alcohol. He reports that he does not use drugs. family history includes Kidney disease in his father. No Known Allergies   Review of Systems  Constitutional: Negative for chills and fever.  Respiratory: Negative for shortness of breath.   Cardiovascular: Negative for chest pain.  Genitourinary: Positive for frequency and urgency.  Musculoskeletal: Positive for arthralgias.  Neurological: Negative for syncope and headaches.  Psychiatric/Behavioral: Positive for confusion and sleep disturbance. Negative for dysphoric mood.       Objective:   Physical Exam  Constitutional: He appears well-developed and well-nourished.  Patient is very hard of hearing which makes communication difficult  Cardiovascular: Normal rate and regular rhythm.  Pulmonary/Chest: Effort normal and breath sounds normal. No respiratory distress. He has no wheezes. He has no rales.  Musculoskeletal:  Increased effusion left knee. No erythema.       Assessment:     #1 chronic insomnia slightly improved with trazodone.  Unfortunately still taking frequent daytime naps  #2 chronic left knee effusion.  #3 hypertension stable and at goal  #4 history of urinary urgency which exacerbates   #5 concerns for cognitive impairment  Plan:     -Stressed importance of avoiding daytime naps as much as possible -Continue trazodone 50 mg daily at bedtime -They're scheduling follow-up with orthopedist's regarding his chronic left knee effusion -Discussed issues regarding cognitive impairment. Family would like to pursue further  cognitive testing with neuropsychologist. We will set up referral  [name] MD [facility] at [facility]

## 2017-04-04 NOTE — Patient Instructions (Signed)
We are setting up referral for neuropsychologist assessment.

## 2017-04-05 ENCOUNTER — Telehealth: Payer: Self-pay | Admitting: *Deleted

## 2017-04-05 NOTE — Telephone Encounter (Signed)
Copied from [place]. Topic: Quick Communication - See Telephone Encounter >> Apr 04, 2017  1:02 PM Burnis Medin, NT wrote: CRM for notification. See Telephone encounter for: Patient wife called and wanted to see if Apolonio Schneiders could call her back regarding patient's appointment.    Spoke with wife and reviewed medication directions and talked about scheduling next office visit.

## 2017-04-07 ENCOUNTER — Encounter: Payer: Self-pay | Admitting: Psychology

## 2017-04-07 ENCOUNTER — Encounter: Payer: Medicare Other | Admitting: Psychology

## 2017-04-07 DIAGNOSIS — M1712 Unilateral primary osteoarthritis, left knee: Secondary | ICD-10-CM | POA: Diagnosis not present

## 2017-04-08 ENCOUNTER — Telehealth: Payer: Self-pay | Admitting: Family Medicine

## 2017-04-08 MED ORDER — TRAZODONE HCL 50 MG PO TABS: ORAL_TABLET | ORAL | 1 refills | Status: DC

## 2017-04-08 NOTE — Telephone Encounter (Signed)
New rx sent

## 2017-04-08 NOTE — Telephone Encounter (Signed)
Copied from [place] [phone]. Topic: Quick Communication - See Telephone Encounter >> Apr 08, 2017 10:39 AM Ahmed Prima L wrote: CRM for notification. See Telephone encounter for: 04/08/17.  Pt's wife was advised that he should start taking the whole pill for traZODone (DESYREL) 50 MG tablet, so the pharmacy needs the script to be worded different so the pharmacy can fill this. She said they have faxed over something but they are not hearing back. Please advise   [place], [place]

## 2017-04-12 NOTE — Telephone Encounter (Signed)
David Howard, [place] 04/12/2017 11:38 AM  Summary: Wife would like a call back.    Patient wife called back and had a question about traZODone (DESYREL) 50 MG tablet and wanted to see if the nurse could give her a call . [phone]

## 2017-04-12 NOTE — Telephone Encounter (Signed)
Let's have them try one and one half tablet qhs (ie 75 mg) to see if that is more effective.

## 2017-04-12 NOTE — Telephone Encounter (Signed)
Wife is aware and med list updated.  Refill not needed at this time.

## 2017-04-12 NOTE — Telephone Encounter (Signed)
Patient feels as though the trazadone is not working.  Can he just stop or need to titrate?

## 2017-04-14 ENCOUNTER — Encounter: Payer: Medicare Other | Admitting: Psychology

## 2017-04-14 DIAGNOSIS — L821 Other seborrheic keratosis: Secondary | ICD-10-CM | POA: Diagnosis not present

## 2017-04-14 DIAGNOSIS — L308 Other specified dermatitis: Secondary | ICD-10-CM | POA: Diagnosis not present

## 2017-04-14 DIAGNOSIS — D1801 Hemangioma of skin and subcutaneous tissue: Secondary | ICD-10-CM | POA: Diagnosis not present

## 2017-04-14 DIAGNOSIS — L570 Actinic keratosis: Secondary | ICD-10-CM | POA: Diagnosis not present

## 2017-04-14 DIAGNOSIS — D692 Other nonthrombocytopenic purpura: Secondary | ICD-10-CM | POA: Diagnosis not present

## 2017-04-26 DIAGNOSIS — R6889 Other general symptoms and signs: Secondary | ICD-10-CM | POA: Diagnosis not present

## 2017-04-27 ENCOUNTER — Telehealth: Payer: Self-pay | Admitting: Family Medicine

## 2017-04-27 DIAGNOSIS — N401 Enlarged prostate with lower urinary tract symptoms: Secondary | ICD-10-CM | POA: Diagnosis not present

## 2017-04-27 NOTE — Telephone Encounter (Signed)
Wife wants to know if pt. Can have a nurse visit for  A  BP check. Wife reports he had 2 readings that were high, 2 days in a row. Please advise pt.

## 2017-04-27 NOTE — Telephone Encounter (Signed)
Copied from [place] [phone]. Topic: Quick Communication - See Telephone Encounter >> Apr 27, 2017 11:53 AM David Howard wrote: CRM for notification.   Wife called went to see the urologist and nurse took BP very high, 167/96  pulse 76 ; 2nd reading 166/86 pulse 76 - he is not complaining of any other symptoms. She is asking if needs to come in?  He did go to the dentist and Urologist the same day.   She is saying that the dosage changed to 1 1/2 everyday and will need refill traZODone (DESYREL) 50 MG tablet  Please call wife, David Howard  See Telephone encounter for: 04/27/17.

## 2017-04-28 MED ORDER — TRAZODONE HCL 50 MG PO TABS: ORAL_TABLET | ORAL | 0 refills | Status: DC

## 2017-04-28 NOTE — Telephone Encounter (Signed)
I called the pt and informed his wife of the message below.  She is aware the Rx was sent to [facility] and will call back for an appt if needed.

## 2017-04-28 NOTE — Telephone Encounter (Signed)
Ok to refill Trazodone for 30 days.   If she has a BP cuff at home, go head and monitor over the next few days. If BP > 643 systolic consistently then can follow up. If she thinks he needs to be seen sooner than please have him come in before Dr. B gets back

## 2017-05-03 ENCOUNTER — Encounter: Payer: Self-pay | Admitting: Family Medicine

## 2017-05-03 ENCOUNTER — Ambulatory Visit (INDEPENDENT_AMBULATORY_CARE_PROVIDER_SITE_OTHER): Payer: Medicare Other | Admitting: Family Medicine

## 2017-05-03 VITALS — BP 130/78 | HR 66 | Temp 98.1°F | Wt 180.3 lb

## 2017-05-03 DIAGNOSIS — I10 Essential (primary) hypertension: Secondary | ICD-10-CM | POA: Diagnosis not present

## 2017-05-03 NOTE — Progress Notes (Signed)
Subjective:     Patient ID: David Howard, male   DOB: July 16, 1923, 84 y.o.   MRN: [id number]  HPI Patient seen with concerns for elevated blood pressure. Both he and his wife had several elevated readings recently at home. She obtained several readings 967 systolic and 89F diastolic. He also had recent elevated reading at urologist but this was with automated cuff. His blood pressure much improved here today. He takes losartan 100 mg daily. Has had previous issues with orthostasis. No recent dietary changes. No headaches. No chest pains. No increased peripheral edema.  Past Medical History:  Diagnosis Date  . Benign neoplasm of colon   . Diverticulosis of colon (without mention of hemorrhage)   . Elevated prostate specific antigen (PSA)    BPH, follows with uro  . Esophageal reflux   . Hearing loss    L>R, follows with ENT, wears hearing aides  . Inguinal hernia without mention of obstruction or gangrene, unilateral or unspecified, (not specified as recurrent)   . Internal hemorrhoids without mention of complication   . Malignant neoplasm of bladder, part unspecified   . Other abnormal glucose   . Peripheral vascular disease, unspecified ([place])   . Personal history of other lymphatic and hematopoietic neoplasm 2003   Primary cutaneous B-cell lymphoma, s/p xrt  . Spinal stenosis, unspecified region other than cervical    R>L LE pain  . Unspecified essential hypertension    Past Surgical History:  Procedure Laterality Date  . CATARACT EXTRACTION    . LAPAROSCOPY    . PILONIDAL CYST / SINUS EXCISION    . TONSILLECTOMY      reports that he has quit smoking. His smoking use included cigarettes. He has never used smokeless tobacco. He reports that he drinks alcohol. He reports that he does not use drugs. family history includes Kidney disease in his father. No Known Allergies   Review of Systems  Respiratory: Negative for cough and shortness of breath.   Cardiovascular: Negative for  chest pain and palpitations.  Neurological: Negative for dizziness, syncope and headaches.       Objective:   Physical Exam  Constitutional: He appears well-developed and well-nourished.  Neck: Neck supple. No thyromegaly present.  Cardiovascular: Normal rate and regular rhythm.  Pulmonary/Chest: Effort normal and breath sounds normal. No respiratory distress. He has no wheezes. He has no rales.  Neurological: He is alert.       Assessment:     Hypertension. Stable. Blood pressure reading here 130/78 per nurse and by me 142/78. Multiple recent elevated readings by home cuff -question accuracy with their machine.    Plan:     -Continue losartan at current dosage. -Routine follow-up in 3 months and sooner as needed  [name] MD [facility] at [facility]

## 2017-05-09 ENCOUNTER — Ambulatory Visit: Payer: Medicare Other | Admitting: Family Medicine

## 2017-05-10 DIAGNOSIS — M1712 Unilateral primary osteoarthritis, left knee: Secondary | ICD-10-CM | POA: Diagnosis not present

## 2017-05-13 ENCOUNTER — Other Ambulatory Visit: Payer: Self-pay | Admitting: Family Medicine

## 2017-05-19 ENCOUNTER — Telehealth: Payer: Self-pay

## 2017-05-19 NOTE — Telephone Encounter (Signed)
Pt's wife has called pharmacy about Trazadone. She has been giving pt 1&1/2 tabs qhs so she has run out of medication early. She did comment to pharmacy that she is not even sure it is really helping him. Most recent rx is for 1tab qhs, but previous rx did have note of 1-1&1/2 tab qhs prn for sleep.   Dr. Elease Hashimoto - Please advise. Thanks!

## 2017-05-20 MED ORDER — TRAZODONE HCL 50 MG PO TABS: ORAL_TABLET | ORAL | 11 refills | Status: DC

## 2017-05-20 NOTE — Telephone Encounter (Signed)
New Rx Sent

## 2017-05-20 NOTE — Telephone Encounter (Signed)
Refill for him to take 1 and one half tab po qhs- may refill for one year.

## 2017-05-24 ENCOUNTER — Telehealth: Payer: Self-pay | Admitting: *Deleted

## 2017-05-24 NOTE — Telephone Encounter (Signed)
Copied from [place]. Topic: General - Other >> May 24, 2017 11:34 AM  [place] wrote:  Pt wife ask Mayer Masker a call back she has a question   [phone]

## 2017-05-24 NOTE — Telephone Encounter (Signed)
Wife is on [place].

## 2017-05-24 NOTE — Telephone Encounter (Signed)
Agree with advice given per nurse.

## 2017-05-24 NOTE — Telephone Encounter (Signed)
Spoke with wife and patient is a "little hoarse".  He does not have a fever, cough, or sore throat.   Advised patient to try and gargle with warm salt water and take Tylenol as needed.  Patient to call back if no improvement. Patient verbally agreed.  FYI

## 2017-07-22 ENCOUNTER — Telehealth: Payer: Self-pay | Admitting: Oncology

## 2017-07-22 ENCOUNTER — Telehealth: Payer: Self-pay | Admitting: Hematology

## 2017-07-22 ENCOUNTER — Inpatient Hospital Stay: Payer: Medicare Other | Attending: Oncology | Admitting: Oncology

## 2017-07-22 VITALS — BP 152/88 | HR 84 | Temp 98.0°F | Resp 18 | Ht 68.0 in | Wt 183.9 lb

## 2017-07-22 DIAGNOSIS — C8590 Non-Hodgkin lymphoma, unspecified, unspecified site: Secondary | ICD-10-CM

## 2017-07-22 DIAGNOSIS — Z8551 Personal history of malignant neoplasm of bladder: Secondary | ICD-10-CM | POA: Diagnosis not present

## 2017-07-22 DIAGNOSIS — Z8572 Personal history of non-Hodgkin lymphomas: Secondary | ICD-10-CM | POA: Diagnosis not present

## 2017-07-22 NOTE — Telephone Encounter (Signed)
F/u TBA per 7/5 los

## 2017-07-22 NOTE — Progress Notes (Signed)
  [facility] OFFICE PROGRESS NOTE   Diagnosis: Non-Hodgkin's lymphoma  INTERVAL HISTORY:   David Howard returns for a scheduled visit.  He is here with his wife.  He reports anorexia.  No fever or palpable lymph nodes.  He is followed by Dr. Elease Howard for primary care.  Objective:   Vital signs in last 24 hours:  Blood pressure (!) 152/88, pulse 84, temperature 98 F (36.7 C), temperature source Oral, resp. rate 18, height 5\' 8"  (1.727 m), weight 183 lb 14.4 oz (83.4 kg), SpO2 96 %.    HEENT: Neck without mass Lymphatics: No cervical, supraclavicular, axillary, or inguinal nodes Resp: Scattered end inspiratory rales at the left greater than right chest, no respiratory distress Cardio: Regular rate and rhythm GI: No hepatosplenomegaly, nontender Vascular: Trace edema at the left greater than right ankle Musculoskeletal: Swelling of the left knee    Medications: I have reviewed the patient's current medications.   Assessment/Plan: 1. Primary cutaneous B-cell lymphoma, status post primary radiation in 2003. No clinical evidence for recurrent disease. 2. History of chronic prominence at the right medial scapula, likely benign asymmetry of the back musculature. 3. History of benign prostatic hypertrophy and superficial bladder cancer, followed by Dr. Alinda Howard. 4. History of a right inguinal hernia, status post repair by Dr. Zella Howard.  Disposition: David Howard remains in clinical remission from cutaneous B-cell lymphoma.  He is now 16 years out from diagnosis.  He was discharged from the medical oncology clinic.  I am available to see him in the future as needed.  He will continue close follow-up with Dr. Jarold Howard for internal medicine care.  15 minutes were spent with the patient today.  The majority of the time was used for counseling and coordination of care.  Betsy Coder, MD  07/22/2017  10:54 AM

## 2017-08-01 ENCOUNTER — Ambulatory Visit (INDEPENDENT_AMBULATORY_CARE_PROVIDER_SITE_OTHER): Payer: Medicare Other | Admitting: Family Medicine

## 2017-08-01 ENCOUNTER — Encounter: Payer: Self-pay | Admitting: Family Medicine

## 2017-08-01 VITALS — BP 110/78 | HR 76 | Temp 98.3°F | Wt 185.8 lb

## 2017-08-01 DIAGNOSIS — F5104 Psychophysiologic insomnia: Secondary | ICD-10-CM

## 2017-08-01 DIAGNOSIS — R5383 Other fatigue: Secondary | ICD-10-CM

## 2017-08-01 DIAGNOSIS — N400 Enlarged prostate without lower urinary tract symptoms: Secondary | ICD-10-CM | POA: Diagnosis not present

## 2017-08-01 DIAGNOSIS — I10 Essential (primary) hypertension: Secondary | ICD-10-CM | POA: Diagnosis not present

## 2017-08-01 LAB — CBC WITH DIFFERENTIAL/PLATELET
BASOS PCT: 0.3 % (ref 0.0–3.0)
Basophils Absolute: 0 10*3/uL (ref 0.0–0.1)
EOS ABS: 0.1 10*3/uL (ref 0.0–0.7)
EOS PCT: 1.3 % (ref 0.0–5.0)
HEMATOCRIT: 40.1 % (ref 39.0–52.0)
HEMOGLOBIN: 13.8 g/dL (ref 13.0–17.0)
LYMPHS PCT: 15.8 % (ref 12.0–46.0)
Lymphs Abs: 1.3 10*3/uL (ref 0.7–4.0)
MCHC: 34.4 g/dL (ref 30.0–36.0)
MCV: 88.1 fl (ref 78.0–100.0)
Monocytes Absolute: 0.7 10*3/uL (ref 0.1–1.0)
Monocytes Relative: 9.1 % (ref 3.0–12.0)
Neutro Abs: 5.9 10*3/uL (ref 1.4–7.7)
Neutrophils Relative %: 73.5 % (ref 43.0–77.0)
Platelets: 146 10*3/uL — ABNORMAL LOW (ref 150.0–400.0)
RBC: 4.55 Mil/uL (ref 4.22–5.81)
RDW: 12.8 % (ref 11.5–15.5)
WBC: 8 10*3/uL (ref 4.0–10.5)

## 2017-08-01 LAB — BASIC METABOLIC PANEL
BUN: 23 mg/dL (ref 6–23)
CO2: 28 meq/L (ref 19–32)
Calcium: 9.2 mg/dL (ref 8.4–10.5)
Chloride: 96 mEq/L (ref 96–112)
Creatinine, Ser: 1.14 mg/dL (ref 0.40–1.50)
GFR: 63.86 mL/min (ref >60.00)
GLUCOSE: 115 mg/dL — AB (ref 70–99)
POTASSIUM: 4.1 meq/L (ref 3.5–5.1)
SODIUM: 134 meq/L — AB (ref 135–145)

## 2017-08-01 LAB — TSH: TSH: 1.72 u[IU]/mL (ref 0.35–4.50)

## 2017-08-01 NOTE — Progress Notes (Signed)
  Subjective:     Patient ID: David Howard, male   DOB: Oct 01, 1923, 84 y.o.   MRN: [id number]  HPI Patient seen for medical follow-up. Chronic problems include hypertension, GERD, history of bladder cancer, BPH, spinal stenosis, history of non-Hodgkin's lymphoma, urinary urgency, chronic insomnia. Wife relates he's had some issues with fatigue and decreased appetite for several months. She is supplementing with nutritional type shake twice a day and has actually gained about 6 pounds since last visit.  She thinks a lot of his fatigue secondary to poor sleep quality. Frequently interrupted because of issues with urinary urgency at night. Currently on trazodone 75 mg at night. Frequently wakes up after going to sleep.  Past Medical History:  Diagnosis Date  . Benign neoplasm of colon   . Diverticulosis of colon (without mention of hemorrhage)   . Elevated prostate specific antigen (PSA)    BPH, follows with uro  . Esophageal reflux   . Hearing loss    L>R, follows with ENT, wears hearing aides  . Inguinal hernia without mention of obstruction or gangrene, unilateral or unspecified, (not specified as recurrent)   . Internal hemorrhoids without mention of complication   . Malignant neoplasm of bladder, part unspecified   . Other abnormal glucose   . Peripheral vascular disease, unspecified ([place])   . Personal history of other lymphatic and hematopoietic neoplasm 2003   Primary cutaneous B-cell lymphoma, s/p xrt  . Spinal stenosis, unspecified region other than cervical    R>L LE pain  . Unspecified essential hypertension    Past Surgical History:  Procedure Laterality Date  . CATARACT EXTRACTION    . LAPAROSCOPY    . PILONIDAL CYST / SINUS EXCISION    . TONSILLECTOMY      reports that he has quit smoking. His smoking use included cigarettes. He has never used smokeless tobacco. He reports that he drinks alcohol. He reports that he does not use drugs. family history includes Kidney  disease in his father. No Known Allergies   Review of Systems  Constitutional: Positive for fatigue. Negative for appetite change and unexpected weight change.  Eyes: Negative for visual disturbance.  Respiratory: Negative for cough, chest tightness and shortness of breath.   Cardiovascular: Negative for chest pain, palpitations and leg swelling.  Neurological: Negative for dizziness, syncope, weakness, light-headedness and headaches.  Psychiatric/Behavioral: Positive for sleep disturbance. Negative for dysphoric mood.       Objective:   Physical Exam  Constitutional: He is oriented to person, place, and time. He appears well-developed and well-nourished.  Neck: Neck supple.  Cardiovascular: Normal rate and regular rhythm.  Pulmonary/Chest: Effort normal and breath sounds normal.  Musculoskeletal:  Only trace edema ankles bilaterally.  Lymphadenopathy:    He has no cervical adenopathy.  Neurological: He is alert and oriented to person, place, and time. No cranial nerve deficit.       Assessment:     #1 hypertension stable and at goal  #2 fatigue-likely multifactorial. Suspect large and related to his and activity and poor sleep quality  #3 chronic insomnia  #4 chronic BPH symptoms followed by urology    Plan:     -Recheck labs today with TSH, CBC, asymmetric metabolic panel -May increase his trazodone to 100 mg daily at bedtime to see if this helps with sleep -Routine follow-up in 6 months and sooner as needed  [name] MD [facility]

## 2017-08-01 NOTE — Patient Instructions (Signed)
May increase the Trazodone to 50 mg two at night as needed.

## 2017-09-01 ENCOUNTER — Encounter: Payer: Self-pay | Admitting: Psychology

## 2017-09-01 ENCOUNTER — Ambulatory Visit: Payer: Medicare Other | Admitting: Psychology

## 2017-09-01 ENCOUNTER — Ambulatory Visit (INDEPENDENT_AMBULATORY_CARE_PROVIDER_SITE_OTHER): Payer: Medicare Other | Admitting: Psychology

## 2017-09-01 ENCOUNTER — Encounter

## 2017-09-01 DIAGNOSIS — R413 Other amnesia: Secondary | ICD-10-CM

## 2017-09-01 NOTE — Progress Notes (Signed)
   Neuropsychology Note  [name] completed 60 minutes of neuropsychological testing with technician, Milana Kidney, BS, under the supervision of Dr. Macarthur Critchley, Licensed Psychologist. The patient did not appear overtly distressed by the testing session, per behavioral observation or via self-report to the technician. Rest breaks were offered.   Clinical Decision Making: In considering the patient's current level of functioning, level of presumed impairment, nature of symptoms, emotional and behavioral responses during the interview, level of literacy, and observed level of motivation/effort, a battery of tests was selected and communicated to the psychometrician.  Communication between the psychologist and technician was ongoing throughout the testing session and changes were made as deemed necessary based on patient performance on testing, technician observations and additional pertinent factors such as those listed above.  David Howard will return within approximately 2 weeks for an interactive feedback session with Dr. Si Raider at which time his test performances, clinical impressions and treatment recommendations will be reviewed in detail. The patient understands he can contact our office should he require our assistance before this time.  35 minutes spent performing neuropsychological evaluation services/clinical decision making (psychologist). [CPT 40370] 60 minutes spent face-to-face with patient administering standardized tests, 30 minutes spent scoring (technician). [CPT Y8200648, 96438]  Full report to follow.

## 2017-09-01 NOTE — Progress Notes (Signed)
NEUROBEHAVIORAL STATUS EXAM   Name: David Howard Date of Birth: 08/20/1923 Date of Interview: 09/01/2017  Reason for Referral:  David Howard is a 84 y.o. male who is referred for neuropsychological evaluation by Dr. Carolann Littler of [facility] [place] due to concerns about cognitive changes. This patient is accompanied in the office by his wife and son who supplement the history.  History of Presenting Problem:  David Howard is here for neurocognitive evaluation primarily due to family's concerns about gradual onset of memory decline a few years ago with more rapid decline in the past two years. He has significant hearing loss and a history of falls. He struck his head in the context of three falls in the past three years (sustained loss of consciousness in at least one of these falls). Head CTs were performed on 05/08/2016 and on 10/31/2014 after falls in which he struck his head. Neither demonstrated any acute abnormality. Moderate cerebral atrophy with mild periventricuclar small vessel disease was noted, unchanged across the two different scans.   At today's visit (09/01/2017) he endorses memory decline ("it's not what it used to be") but states "it is what it is". He does not feel there is anything that can be done about it so he does not concern himself with it.   His wife and son are concerned about what they describe as a very noticeable and rapid progression of memory decline in the last few years. In the past two years, he has started asking about family members who are deceased. He will forget whether or not his parents or sister are alive, for example (they have been deceased for many years). He also has been unable to recall where his grandchildren live and what kind of work they do. He repeats questions frequently. He has trouble keeping track of the day and the date. He forgets where things are placed in the home. He isn't able to recall directions/routes to many  places.  David Howard stopped driving about a year ago, at his family's urging. He was having difficulty managing his medications so his wife is handling that now. He is still managing the household bills but his wife has been helping more lately.   He is much less active. 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 For a few years, he went in to the office once a week. In the last 6 months he has stopped going in to the office. He does not seem to mind sitting and doing nothing now. He does still read newspapers and magazines but has not been reading books anymore. He watches the news and one television program. Sometimes he just sits and watches the TV without putting on his headphones so that he can hear it. He doesn't get out of the house much (mostly just for doctor's appointments). He dozes off during the day. At night, he goes to bed around 9 or 9:30 pm but does not sleep deeply and when he wakes up he has a difficult time getting back to sleep. He is usually unable to fall back asleep at 3 or 4 am. He is frustrated by not sleeping well. He takes trazodone for sleep. His wife notes that in the past he had been taking Advil PM without telling her but since she found out she has hid this medication from him. His appetite is diminished. He eats two meals a day, much smaller portions than he did in the past. He  does not drink any alcohol.  His mood has been good. His wife describes his mood as placid. He does not get upset about things anymore, unless she is pushing him to do something he doesn't want to do. He was quite angry about a year ago when his family encouraged him to stop driving, but this has dissipated over time. He has not demonstrated any hallucinations or delusions. He has not expressed any suicidal thoughts or intention. He has not been disoriented in his own home or to his close family members. There has been no decline in personal hygiene. He manages all basic ADLs independently but  does use a walker now to ambulate. He has not had any recent falls.  With regard to personality change, his wife and son note that he is much more easy-going and mellow than he was in the past.   Psychiatric history was denied. He has never been treated for any mental health condition.   Family history is positive for dementia in his mother and sister (both died in their 20s).    Social History: Born/Raised: Born in [place], raised in [place] Education: He graduated from high school and started college at age 47. He completed his college degree from [employer].  Occupational history: Engineer, maintenance (IT) Marital history: He and his wife have been married for 107 years. They have a son who lives in [place] and a daughter who lives in [place], [place]. They have two grandchildren. The patient is very close with his immediate and extended family. He has a nephew and niece who live locally and who help them and check on them regularly. Alcohol: None now. He used to drink wine (moderate use). Tobacco: Former smoker, quit about 32 years ago   Medical History: Past Medical History:  Diagnosis Date  . Benign neoplasm of colon   . Diverticulosis of colon (without mention of hemorrhage)   . Elevated prostate specific antigen (PSA)    BPH, follows with uro  . Esophageal reflux   . Hearing loss    L>R, follows with ENT, wears hearing aides  . Inguinal hernia without mention of obstruction or gangrene, unilateral or unspecified, (not specified as recurrent)   . Internal hemorrhoids without mention of complication   . Malignant neoplasm of bladder, part unspecified   . Other abnormal glucose   . Peripheral vascular disease, unspecified ([place])   . Personal history of other lymphatic and hematopoietic neoplasm 2003   Primary cutaneous B-cell lymphoma, s/p xrt  . Spinal stenosis, unspecified region other than cervical    R>L LE pain  . Unspecified essential hypertension       Current Medications:    Outpatient Encounter Medications as of 09/01/2017  Medication Sig  . aspirin 81 MG tablet Take 81 mg by mouth every other day.   . Cholecalciferol (VITAMIN D3) 1000 UNITS CAPS Take 1 capsule by mouth daily.  . finasteride (PROSCAR) 5 MG tablet TAKE 1 TABLET ONCE DAILY.  Marland Kitchen losartan (COZAAR) 100 MG tablet TAKE 1 TABLET ONCE DAILY.  . Multiple Vitamin (ESSENTIAL ONE DAILY PO) Take by mouth.  . Multiple Vitamin (MULTIVITAMIN) capsule Take 1 capsule by mouth daily.    . Omega-3 Fatty Acids (FISH OIL) 1000 MG CAPS Take 1 capsule by mouth daily.   . psyllium (HYDROCIL/METAMUCIL) 95 % PACK Take 1 packet by mouth daily.  . traZODone (DESYREL) 50 MG tablet Take one and half tab at bedtime as needed for sleep   No facility-administered encounter  medications on file as of 09/01/2017.      Behavioral Observations:   Appearance: Neatly and appropriately dressed and groomed Gait: Ambulated with a walker Speech: Fluent; normal rate, rhythm and volume. Thought process: Linear, logical Affect: Appropriate, pleasant, euthymic Interpersonal: Pleasant, appropriate. Significant hearing loss noted.   120 minutes spent face-to-face with patient/family completing neurobehavioral status exam (patient and family were interviewed together for one hour, then one hour was spent with his wife and son). 40 minutes spent integrating medical records/clinical data and completing this report. [id number] unit; [id number] units.   TESTING: There is medical necessity to proceed with neuropsychological assessment as the results will be used to aid in differential diagnosis and clinical decision-making and to inform specific treatment recommendations. Per the patient, his family and medical records reviewed, there has been a change in cognitive functioning and a reasonable suspicion of dementia (rule out AD - clinical expression of the illness may have been prompted by concussions).  Clinical Decision Making: In considering the  patient's current level of functioning, level of presumed impairment, nature of symptoms, emotional and behavioral responses during the interview, level of literacy, and observed level of motivation, a battery of tests was selected and communicated to the psychometrician.    Following the clinical interview/neurobehavioral status exam, the patient completed this full battery of neuropsychological testing with my psychometrician under my supervision (see separate note).   PLAN: The patient and/or his family will return to see me for a follow-up session at which time his test performances and my impressions and treatment recommendations will be reviewed in detail.  Evaluation ongoing; full report to follow.

## 2017-09-02 DIAGNOSIS — H353131 Nonexudative age-related macular degeneration, bilateral, early dry stage: Secondary | ICD-10-CM | POA: Diagnosis not present

## 2017-09-02 DIAGNOSIS — H40013 Open angle with borderline findings, low risk, bilateral: Secondary | ICD-10-CM | POA: Diagnosis not present

## 2017-09-02 DIAGNOSIS — H35033 Hypertensive retinopathy, bilateral: Secondary | ICD-10-CM | POA: Diagnosis not present

## 2017-09-02 DIAGNOSIS — Z961 Presence of intraocular lens: Secondary | ICD-10-CM | POA: Diagnosis not present

## 2017-09-04 ENCOUNTER — Encounter: Payer: Self-pay | Admitting: Psychology

## 2017-09-06 DIAGNOSIS — H6123 Impacted cerumen, bilateral: Secondary | ICD-10-CM | POA: Diagnosis not present

## 2017-09-06 DIAGNOSIS — H903 Sensorineural hearing loss, bilateral: Secondary | ICD-10-CM | POA: Diagnosis not present

## 2017-09-07 ENCOUNTER — Encounter: Payer: Self-pay | Admitting: Family Medicine

## 2017-09-07 ENCOUNTER — Ambulatory Visit: Payer: Medicare Other | Admitting: Family Medicine

## 2017-09-07 VITALS — BP 134/80 | HR 74 | Temp 97.9°F | Wt 185.3 lb

## 2017-09-07 DIAGNOSIS — F5104 Psychophysiologic insomnia: Secondary | ICD-10-CM | POA: Diagnosis not present

## 2017-09-07 DIAGNOSIS — I10 Essential (primary) hypertension: Secondary | ICD-10-CM | POA: Diagnosis not present

## 2017-09-07 NOTE — Patient Instructions (Addendum)
Monitor blood pressure and be in touch if consistently > 140/90  Try to keep sodium intake < 2,000 mg per day.

## 2017-09-07 NOTE — Progress Notes (Signed)
Subjective:     Patient ID: David Howard, male   DOB: [birth year], 84 y.o.   MRN: [id number]  HPI Patient is seen for evaluation of hypertension. Is currently treated with losartan. He recently went to ENT office and had reading of 167/100. This was with automated cuff. They went home and wife took blood pressure 3 times with readings of 164/95, 159/91, 155/98. Compliant with therapy. No recent headaches. No chest pains. No increased edema. Occasional Advil use. No regular alcohol use. Tries to keep sodium intake down.  Adds very little salt to foods.    Chronic insomnia. Currently on trazodone 75 mg at night. Still has sleep difficulties with that. Does take some daytime naps. He tried melatonin without relief. He's been battling this for several years.  Also has history of BPH and frequent nocturia which interferes with sleep. He's followed by urology.    Recent concern for cognitive impairment. Wife thinks a lot of this is related to inability to hear. He is in process of being worked up by [employer] [facility]  Past Medical History:  Diagnosis Date  . Benign neoplasm of colon   . Diverticulosis of colon (without mention of hemorrhage)   . Elevated prostate specific antigen (PSA)    BPH, follows with uro  . Esophageal reflux   . Hearing loss    L>R, follows with ENT, wears hearing aides  . Inguinal hernia without mention of obstruction or gangrene, unilateral or unspecified, (not specified as recurrent)   . Internal hemorrhoids without mention of complication   . Malignant neoplasm of bladder, part unspecified   . Other abnormal glucose   . Peripheral vascular disease, unspecified ([place])   . Personal history of other lymphatic and hematopoietic neoplasm 2003   Primary cutaneous B-cell lymphoma, s/p xrt  . Spinal stenosis, unspecified region other than cervical    R>L LE pain  . Unspecified essential hypertension    Past Surgical History:  Procedure Laterality Date  . CATARACT  EXTRACTION    . LAPAROSCOPY    . PILONIDAL CYST / SINUS EXCISION    . TONSILLECTOMY      reports that he has quit smoking. His smoking use included cigarettes. He has never used smokeless tobacco. He reports that he drinks alcohol. He reports that he does not use drugs. family history includes Kidney disease in his father. No Known Allergies   Review of Systems  Constitutional: Negative for fatigue and unexpected weight change.  Eyes: Negative for visual disturbance.  Respiratory: Negative for cough, chest tightness and shortness of breath.   Cardiovascular: Negative for chest pain, palpitations and leg swelling.  Endocrine: Negative for polydipsia and polyuria.  Neurological: Negative for dizziness, syncope, weakness, light-headedness and headaches.  Psychiatric/Behavioral: Positive for sleep disturbance.       Objective:   Physical Exam  Constitutional: He appears well-developed and well-nourished.  Cardiovascular: Normal rate and regular rhythm.  Pulmonary/Chest: Effort normal and breath sounds normal.  Musculoskeletal:  Trace edema ankles bilaterally which is chronic and unchanged  Neurological: He is alert. No cranial nerve deficit.  Psychiatric: He has a normal mood and affect. His behavior is normal.       Assessment:     #1 Hypertension. Several elevated readings recently. Repeat today right arm seated 134/80 and left arm seated 138/80 after rest  #2 chronic insomnia-currently on trazodone 75 mg daily at bedtime    Plan:     -We have recommended observation at this point. Check blood pressures and monitor  and be in touch if consistently greater than 140/90 -Keep sodium intake less than 2000 mg daily -Consider low-dose amlodipine if he continues to have frequent blood pressure spikes over 164 systolic. -Increase trazodone to 100 mg daily at bedtime when necessary -Avoid daytime napping as much as possible  [name] MD [facility] at  [facility]

## 2017-09-08 NOTE — Progress Notes (Signed)
NEUROPSYCHOLOGICAL EVALUATION   Name:    David Howard  Date of Birth:   [birth year] Date of Interview:  09/01/2017 Date of Testing:  09/01/2017   Date of Feedback:  09/15/2017       Background Information:  Reason for Referral:  Welles Walthall is a 84 y.o. male referred by Dr. Carolann Littler of [facility] [place] to assess his current level of cognitive functioning and assist in differential diagnosis. The current evaluation consisted of a review of available medical records, an interview with the patient and his wife and his son, and the completion of a neuropsychological testing battery. Informed consent was obtained.  History of Presenting Problem:  Mr. Guyette is here for neurocognitive evaluation primarily due to family's concerns about gradual onset of memory decline a few years ago with more rapid decline in the past two years. He has significant hearing loss and a history of falls. He struck his head in the context of three falls in the past three years (sustained loss of consciousness in at least one of these falls). Head CTs were performed on 05/08/2016 and on 10/31/2014 after falls in which he struck his head. Neither demonstrated any acute abnormality. Moderate cerebral atrophy with mild periventricuclar small vessel disease was noted, unchanged across the two different scans.   At today's visit (09/01/2017) he endorses memory decline ("it's not what it used to be") but states "it is what it is". He does not feel there is anything that can be done about it so he does not concern himself with it.   His wife and son are concerned about what they describe as a very noticeable and rapid progression of memory decline in the last few years. In the past two years, he has started asking about family members who are deceased. He will forget whether or not his parents or sister are alive, for example (they have been deceased for many years). He also has been unable to recall  where his grandchildren live and what kind of work they do. He repeats questions frequently. He has trouble keeping track of the day and the date. He forgets where things are placed in the home. He isn't able to recall directions/routes to many places.  Mr. Tetreault stopped driving about a year ago, at his family's urging. He was having difficulty managing his medications so his wife is handling that now. He is still managing the household bills but his wife has been helping more lately.   He is much less active. 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 For a few years, he went in to the office once a week. In the last 6 months he has stopped going in to the office. He does not seem to mind sitting and doing nothing now. He does still read newspapers and magazines but has not been reading books anymore. He watches the news and one television program. Sometimes he just sits and watches the TV without putting on his headphones so that he can hear it. He doesn't get out of the house much (mostly just for doctor's appointments). He dozes off during the day. At night, he goes to bed around 9 or 9:30 pm but does not sleep deeply and when he wakes up he has a difficult time getting back to sleep. He is usually unable to fall back asleep at 3 or 4 am. He is frustrated by not sleeping well. He takes trazodone for sleep. His wife  notes that in the past he had been taking Advil PM without telling her but since she found out she has hid this medication from him. His appetite is diminished. He eats two meals a day, much smaller portions than he did in the past. He does not drink any alcohol.  His mood has been good. His wife describes his mood as placid. He does not get upset about things anymore, unless she is pushing him to do something he doesn't want to do. He was quite angry about a year ago when his family encouraged him to stop driving, but this has dissipated over time. He has not demonstrated any  hallucinations or delusions. He has not expressed any suicidal thoughts or intention. He has not been disoriented in his own home or to his close family members. There has been no decline in personal hygiene. He manages all basic ADLs independently but does use a walker now to ambulate. He has not had any recent falls.  With regard to personality change, his wife and son note that he is much more easy-going and mellow than he was in the past.   Psychiatric history was denied. He has never been treated for any mental health condition.   Family history is positive for dementia in his mother and sister (both died in their 84s).    Social History: Born/Raised: Born in [place], raised in [place] Education: He graduated from high school and started college at age 89. He completed his college degree from [employer].  Occupational history: Engineer, maintenance (IT) Marital history: He and his wife have been married for 11 years. They have a son who lives in [place] and a daughter who lives in [place], [place]. They have two grandchildren. The patient is very close with his immediate and extended family. He has a nephew and niece who live locally and who help them and check on them regularly. Alcohol: None now. He used to drink wine (moderate use). Tobacco: Former smoker, quit about 32 years ago   Medical History:  Past Medical History:  Diagnosis Date  . Benign neoplasm of colon   . Diverticulosis of colon (without mention of hemorrhage)   . Elevated prostate specific antigen (PSA)    BPH, follows with uro  . Esophageal reflux   . Hearing loss    L>R, follows with ENT, wears hearing aides  . Inguinal hernia without mention of obstruction or gangrene, unilateral or unspecified, (not specified as recurrent)   . Internal hemorrhoids without mention of complication   . Malignant neoplasm of bladder, part unspecified   . Other abnormal glucose   . Peripheral vascular disease, unspecified ([place])   . Personal  history of other lymphatic and hematopoietic neoplasm 2003   Primary cutaneous B-cell lymphoma, s/p xrt  . Spinal stenosis, unspecified region other than cervical    R>L LE pain  . Unspecified essential hypertension     Current medications:  Outpatient Encounter Medications as of 09/15/2017  Medication Sig  . aspirin 81 MG tablet Take 81 mg by mouth every other day.   . Cholecalciferol (VITAMIN D3) 1000 UNITS CAPS Take 1 capsule by mouth daily.  . finasteride (PROSCAR) 5 MG tablet TAKE 1 TABLET ONCE DAILY.  Marland Kitchen losartan (COZAAR) 100 MG tablet TAKE 1 TABLET ONCE DAILY.  . Multiple Vitamin (ESSENTIAL ONE DAILY PO) Take by mouth.  . Multiple Vitamin (MULTIVITAMIN) capsule Take 1 capsule by mouth daily.    . Omega-3 Fatty Acids (FISH OIL) 1000 MG  CAPS Take 1 capsule by mouth daily.   . psyllium (HYDROCIL/METAMUCIL) 95 % PACK Take 1 packet by mouth daily.  . traZODone (DESYREL) 50 MG tablet Take one and half tab at bedtime as needed for sleep   No facility-administered encounter medications on file as of 09/15/2017.      Current Examination:  Behavioral Observations:  Appearance: Neatly and appropriately dressed and groomed Gait: Ambulated with a walker Speech: Fluent; normal rate, rhythm and volume. Thought process: Linear, logical Affect: Appropriate, pleasant, euthymic Interpersonal: Pleasant, appropriate. Significant hearing loss noted. Orientation: Oriented to all spheres. Accurately named the current President and his predecessor.  The patient utilized our "Pocket Talker" amplification system during the testing session which improved his ability to hear and understand test instructions.  Tests Administered: . Test of Premorbid Functioning (TOPF) . Wechsler Adult Intelligence Scale-Fourth Edition (WAIS-IV): Similarities and Digit Span subtests . [employer] Verbal Learning Test - 2nd Edition (CVLT-2) Short Form . Repeatable Battery for the Assessment of Neuropsychological Status  (RBANS) Form A:  Story Memory and Story Recall, Figure Copy and Recall, Semantic Fluency and Coding subtests . [place] (BNT) . [place] Diagnostic Aphasia Examination: Complex Ideational Material subtest . Controlled Oral Word Association Test (COWAT) . Trail Making Test A and B . Clock drawing test . Geriatric Depression Scale (GDS) 15 Item . Generalized Anxiety Disorder - 7 item screener (GAD-7)  Test Results: Note: Standardized scores are presented only for use by appropriately trained professionals and to allow for any future test-retest comparison. These scores should not be interpreted without consideration of all the information that is contained in the rest of the report. The most recent standardization samples from the test publisher or other sources were used whenever possible to derive standard scores; scores were corrected for age, gender, ethnicity and education when available.   Test Scores:  Test Name Raw Score Standardized Score Descriptor  TOPF 60/70 SS= 119 High average  WAIS-IV Subtests     Similarities 20/36 ss= 10 Average  Digit Span Forward 8/16 ss= 8 Low end of average  Digit Span Backward 8/16 ss= 12 High average  RBANS Subtests     Story Memory 17/24 Z= 0.4 Average  Story Recall 5/12 Z= -0.9 Low average  Figure Copy 14/20 Z= -1.7 Borderline  Figure Recall 6/20 Z= -1.3 Low average  Semantic Fluency 12/40 Z= -1.5 Borderline  Coding 29/89 Z= -0.7 Average  CVLT-II Scores     Trial 1 0/9 Z= -3.5 Severely impaired  Trial 4 3/9 Z= -2.5 Impaired  Trials 1-4 total 10/36 T= 19 Severely impaired  SD Free Recall 2/9 Z= -2 Impaired  LD Free Recall 1/9 Z= -1 Low average  LD Cued Recall 2/9 Z= -1.5 Borderline  Recognition Discriminability 7/9 hits 8  false positives Z= -1 Low average  Forced Choice Recognition 7/9  Impaired  BNT 29/60 T= 19 Severely impaired  BDAE Subtest     Complex Ideational Material 4/6  Impaired  COWAT-FAS 26 T= 40 Low average    COWAT-Animals 9 T= 33 Borderline  Trail Making Test A  97" 0 errors T= 36 Borderline  Trail Making Test B  Pt unable     Clock Drawing   Impaired  GDS-15 4/15  WNL  GAD-7 2/21  WNL      Description of Test Results:  Premorbid verbal intellectual abilities were estimated to have been within at least the high average range based on a test of word reading. Psychomotor processing speed was  average. Auditory attention and working memory were average to high average. Visual-spatial construction was borderline impaired; this was due to omission of a few features on his drawn copy of a complex geometric figure. Language abilities were below expectation. Specifically, confrontation naming was severely impaired, and semantic verbal fluency was borderline. Auditory comprehension of complex ideational material was impaired. With regard to verbal memory, encoding and acquisition of non-contextual information (i.e., word list) was severely impaired. After a brief distracter task, free recall was impaired (2/9 items). After a delay, free recall was low average for age (1/9 items). Cued recall was borderline (2/9 items). Performance on a yes/no recognition task was impaired due to high number of false positive errors. Performance on a forced choice recognition task also was impaired. On another verbal memory test, encoding and acquisition of contextual auditory information (i.e., short story) was average. After a delay, free recall was low average (5/12 features recalled). With regard to non-verbal memory, delayed free recall of visual information was low average. Executive functioning was variable. Mental flexibility and set-shifting were impaired; he was unable to complete Trails B. Verbal fluency with phonemic search restrictions was low average. Verbal abstract reasoning was average. Performance on a clock drawing task was impaired. On a self-report measure of mood, the patient's responses were not indicative  of clinically significant depression at the present time. On a self-report measure of anxiety, the patient did not endorse clinically significant generalized anxiety at the present time.    Clinical Impressions: Mild dementia without behavioral disturbance. Results of cognitive testing demonstrated significant deficits in learning and memory of new information, confrontation naming, and aspects of executive functioning. Additionally, visual-spatial construction and semantic fluency were mildly impaired. There is evidence that the patient's cognitive deficits are interfering his ability to manage complex ADLs such as working (which he was able to do well into his 50s, remarkably), medications and finances. As such, diagnostic criteria are met for a dementia syndrome. Based on current test results and functioning, I would characterize this as mild stage dementia. Fortunately, there is no evidence to suggest clinically significant depression or anxiety. He appears to be coping with cognitive and functional changes relatively well. There was initially significant irritation on his part when his family requested that he stop driving, but this doesn't appear to be a major issue at this point.  In terms of etiology, the patient's age, neuroimaging results (e.g., moderate cerebral atrophy), clinical features and cognitive profile are most suggestive of underlying Alzheimer's disease. He has a history of falls with possible concussions in recent years, which may have exacerbated expression of underlying Alzheimer's disease to some extent. However, I do not believe his current cognitive impairment is due to the concussions themselves. This is a patient of above-average premorbid intellectual ability, and I suspect he had significant "cognitive reserve" which allowed him to continue functioning at a high level until the past 1-2 years.    Recommendations/Plan: Based on the findings of the present evaluation, the  following recommendations are offered:  1. The patient appears to be an appropriate candidate for cholinesterase inhibitor therapy (e.g., Aricept), from a cognitive standpoint. The family, if interested, can discuss this with Dr. Elease Hashimoto. Please note: The patient's family prefers that the term "mild dementia" be used instead of Alzheimer's disease, when discussing his condition and medications used to treat it.  2. The patient's wife and family may benefit from attending dementia caregiver support groups and/or reading additional information on caregiving for mild stage dementia. The  patient has tremendous support from his wife, children and extended family. I do not see any reason he and his wife would need to leave their home based on his current functioning.   3. I provided information on [facility] [employer] Navigator, a Education officer, museum who comes to the house to assess in home needs such as companion services for when the patient's wife is out of the house. I encouraged his wife to consider such a service so that she is able to enjoy activities outside of the house, maintain her own health and wellbeing, and decrease the risk of caregiver burnout.   Feedback to Patient/Family: The patient's son, [name], completed a feedback appointment with me on the phone on 09/08/2017; 50 minutes was spent reviewing test results and my impressions. The patient's wife and daughter returned for a feedback appointment with me in my office on 09/15/2017. 40 minutes face-to-face time was spent reviewing his test results, my impressions and my recommendations as detailed above.     Total time spent on this patient's case: 160 minutes for neurobehavioral status exam with psychologist (CPT code [phone], [phone] units); 90 minutes of testing/scoring by psychometrician under psychologist's supervision (CPT codes [phone], [phone] units); 240 minutes for integration of patient data, interpretation of standardized test results and  clinical data, clinical decision making, treatment planning and preparation of this report, and interactive feedback with review of results to the patient/family by psychologist (CPT codes [phone], [phone] units).      Thank you for your referral of Neshawn Aird. Please feel free to contact me if you have any questions or concerns regarding this report.

## 2017-09-15 ENCOUNTER — Ambulatory Visit (INDEPENDENT_AMBULATORY_CARE_PROVIDER_SITE_OTHER): Payer: Medicare Other | Admitting: Psychology

## 2017-09-15 ENCOUNTER — Encounter: Payer: Self-pay | Admitting: Psychology

## 2017-09-15 DIAGNOSIS — F0390 Unspecified dementia without behavioral disturbance: Secondary | ICD-10-CM | POA: Diagnosis not present

## 2017-09-15 DIAGNOSIS — F03A0 Unspecified dementia, mild, without behavioral disturbance, psychotic disturbance, mood disturbance, and anxiety: Secondary | ICD-10-CM

## 2017-09-21 ENCOUNTER — Ambulatory Visit (INDEPENDENT_AMBULATORY_CARE_PROVIDER_SITE_OTHER): Payer: Medicare Other | Admitting: Family Medicine

## 2017-09-21 DIAGNOSIS — I10 Essential (primary) hypertension: Secondary | ICD-10-CM | POA: Diagnosis not present

## 2017-09-21 DIAGNOSIS — Z23 Encounter for immunization: Secondary | ICD-10-CM

## 2017-09-21 DIAGNOSIS — F0390 Unspecified dementia without behavioral disturbance: Secondary | ICD-10-CM | POA: Diagnosis not present

## 2017-09-21 DIAGNOSIS — F03A0 Unspecified dementia, mild, without behavioral disturbance, psychotic disturbance, mood disturbance, and anxiety: Secondary | ICD-10-CM | POA: Insufficient documentation

## 2017-09-21 DIAGNOSIS — R600 Localized edema: Secondary | ICD-10-CM | POA: Diagnosis not present

## 2017-09-21 MED ORDER — DONEPEZIL HCL 5 MG PO TABS: 5.0000 mg | ORAL_TABLET | Freq: Every day | ORAL | 0 refills | Status: DC

## 2017-09-21 NOTE — Progress Notes (Signed)
  Subjective:     Patient ID: David Howard, male   DOB: 1923-03-18, 84 y.o.   MRN: [id number]  HPI Patient here with wife to discuss recent neurocognitive assessment. He had extensive cognitive assessment with evidence for mild dementia probably Alzheimer's type. Recent notes from neuropsychologist were reviewed in detail. There was suggestion to consider starting low-dose Aricept. Wife would like to proceed in that direction.  He had CT head 05/08/16 which showed age-related atrophy. No other concerns.  Recent TSH and B12 levels normal. He has had some recent falls in recent years but none since last visit  He had some ongoing mild peripheral edema. No dyspnea. Very inactive. ambulates with walker.  Hypertension. They bring in several home readings. These vary considerably but most are under 938 systolic.  Past Medical History:  Diagnosis Date  . Benign neoplasm of colon   . Diverticulosis of colon (without mention of hemorrhage)   . Elevated prostate specific antigen (PSA)    BPH, follows with uro  . Esophageal reflux   . Hearing loss    L>R, follows with ENT, wears hearing aides  . Inguinal hernia without mention of obstruction or gangrene, unilateral or unspecified, (not specified as recurrent)   . Internal hemorrhoids without mention of complication   . Malignant neoplasm of bladder, part unspecified   . Other abnormal glucose   . Peripheral vascular disease, unspecified ([place])   . Personal history of other lymphatic and hematopoietic neoplasm 2003   Primary cutaneous B-cell lymphoma, s/p xrt  . Spinal stenosis, unspecified region other than cervical    R>L LE pain  . Unspecified essential hypertension    Past Surgical History:  Procedure Laterality Date  . CATARACT EXTRACTION    . LAPAROSCOPY    . PILONIDAL CYST / SINUS EXCISION    . TONSILLECTOMY      reports that he has quit smoking. His smoking use included cigarettes. He has never used smokeless tobacco. He reports that  he drinks alcohol. He reports that he does not use drugs. family history includes Kidney disease in his father. No Known Allergies   Review of Systems  Constitutional: Negative for fatigue and unexpected weight change.  Eyes: Negative for visual disturbance.  Respiratory: Negative for cough, chest tightness and shortness of breath.   Cardiovascular: Positive for leg swelling. Negative for chest pain and palpitations.  Gastrointestinal: Negative for nausea and vomiting.  Neurological: Negative for dizziness, syncope, weakness, light-headedness and headaches.       Objective:   Physical Exam  Constitutional: He appears well-developed and well-nourished.  Cardiovascular: Normal rate and regular rhythm.  Pulmonary/Chest: Effort normal and breath sounds normal.  Musculoskeletal:  Trace pitting edema lower legs bilaterally  Neurological: He is alert.       Assessment:     #1 mild dementia by recent cognitive testing. Probably early Alzheimer's dementia  #2 hypertension stable  #3 mild peripheral edema probably related to venous stasis and diastolic dysfunction    Plan:     -continue with losartan -Discussed compression but they're having difficulty getting these on -Start Aricept 5 mg once daily and reviewed potential side effects. Reassess in one month and titrated up to 10 mg at that point if no difficulties -Flu vaccine given  [name] MD [facility]

## 2017-09-21 NOTE — Patient Instructions (Signed)
Take the Aricept at night.    Watch for any side effects such as nausea.    Let's plan on follow up in one month

## 2017-09-21 NOTE — Addendum Note (Signed)
Addended by: Westley Hummer B on: 09/21/2017 06:27 PM   Modules accepted: Orders

## 2017-10-04 ENCOUNTER — Other Ambulatory Visit: Payer: Self-pay | Admitting: Family Medicine

## 2017-10-07 ENCOUNTER — Telehealth: Payer: Self-pay | Admitting: *Deleted

## 2017-10-07 MED ORDER — TRAZODONE HCL 50 MG PO TABS: ORAL_TABLET | ORAL | 5 refills | Status: DC

## 2017-10-07 NOTE — Telephone Encounter (Signed)
Rx called in 

## 2017-10-07 NOTE — Telephone Encounter (Signed)
Change dose to 2 qhs and refill for 6 months.

## 2017-10-07 NOTE — Telephone Encounter (Signed)
[employer]  traZODone (DESYREL) 50 MG tablet  Directions take 1 and 1/2 tabs at bedtime as needed  Patient states he is taking 2 tabs at bedtime  Okay to fill?

## 2017-10-18 ENCOUNTER — Telehealth: Payer: Self-pay | Admitting: Family Medicine

## 2017-10-18 NOTE — Telephone Encounter (Signed)
Called patient and spoke to wife and I have made an appointment for patient tomorrow at 3:15pm for the Aricept dosage increase. Wife states that the patient has tolerated well and she has not seen a difference in his behavior.   Wife also asked if you could talk to David Howard to have him stop driving? Wife states that he is upset and wants to drive and she feels it is too dangerous at this point and he needs to stop driving. She wanted you to know before you see them tomorrow.

## 2017-10-18 NOTE — Telephone Encounter (Signed)
Copied from [place] [id number]. Topic: Quick Communication - Rx Refill/Question >> Oct 18, 2017  1:18 PM [name], NT wrote: Medication: donepezil (ARICEPT) 5 MG tablet    Wife states the dosage was to increased and she wants to know if he needs to come in.  Has the patient contacted their pharmacy? Yes.   (Agent: If no, request that the patient contact the pharmacy for the refill.) (Agent: If yes, when and what did the pharmacy advise?)  Preferred Pharmacy (with phone number or street name): [place], [place]. [phone] (Phone) [phone] (Fax)    Agent: Please be advised that RX refills may take up to 3 business days. We ask that you follow-up with your pharmacy.

## 2017-10-18 NOTE — Telephone Encounter (Signed)
Noted. Will discuss

## 2017-10-19 ENCOUNTER — Ambulatory Visit (INDEPENDENT_AMBULATORY_CARE_PROVIDER_SITE_OTHER): Payer: Medicare Other | Admitting: Family Medicine

## 2017-10-19 ENCOUNTER — Other Ambulatory Visit: Payer: Self-pay

## 2017-10-19 ENCOUNTER — Encounter: Payer: Self-pay | Admitting: Family Medicine

## 2017-10-19 VITALS — BP 140/88 | HR 71 | Temp 98.2°F | Ht 68.0 in | Wt 184.2 lb

## 2017-10-19 DIAGNOSIS — F0390 Unspecified dementia without behavioral disturbance: Secondary | ICD-10-CM | POA: Diagnosis not present

## 2017-10-19 DIAGNOSIS — F5104 Psychophysiologic insomnia: Secondary | ICD-10-CM | POA: Diagnosis not present

## 2017-10-19 DIAGNOSIS — I10 Essential (primary) hypertension: Secondary | ICD-10-CM

## 2017-10-19 DIAGNOSIS — F03A0 Unspecified dementia, mild, without behavioral disturbance, psychotic disturbance, mood disturbance, and anxiety: Secondary | ICD-10-CM

## 2017-10-19 MED ORDER — DONEPEZIL HCL 10 MG PO TABS: 10.0000 mg | ORAL_TABLET | Freq: Every day | ORAL | 3 refills | Status: AC

## 2017-10-19 NOTE — Patient Instructions (Signed)

## 2017-10-19 NOTE — Progress Notes (Signed)
Subjective:     Patient ID: David Howard, male   DOB: 09-Aug-1923, 84 y.o.   MRN: [id number]  HPI  Patient has chronic problems including hypertension, chronic insomnia, GERD, dementia, decreased hearing, remote history of bladder cancer.  We recently started Aricept 5 mg daily which he has tolerated without side effects.  No nausea.  Wife has not seen any real changes yet.  Our plan was to increase this to 10 mg at this time.  Still having tremendous difficulty sleeping.  Currently on trazodone 100 mg at night.  He tried melatonin prior to that without improvement.  He has past history of syncope and falls and we are trying to avoid things like benzodiazepines.  Wife states that he does a lot of "catnapping" frequently throughout the day.  He does not use any significant alcohol at night.  No caffeine use at night.  Hypertension treated with losartan.  Blood pressures been relatively stable.  Past Medical History:  Diagnosis Date  . Benign neoplasm of colon   . Diverticulosis of colon (without mention of hemorrhage)   . Elevated prostate specific antigen (PSA)    BPH, follows with uro  . Esophageal reflux   . Hearing loss    L>R, follows with ENT, wears hearing aides  . Inguinal hernia without mention of obstruction or gangrene, unilateral or unspecified, (not specified as recurrent)   . Internal hemorrhoids without mention of complication   . Malignant neoplasm of bladder, part unspecified   . Other abnormal glucose   . Peripheral vascular disease, unspecified ([place])   . Personal history of other lymphatic and hematopoietic neoplasm 2003   Primary cutaneous B-cell lymphoma, s/p xrt  . Spinal stenosis, unspecified region other than cervical    R>L LE pain  . Unspecified essential hypertension    Past Surgical History:  Procedure Laterality Date  . CATARACT EXTRACTION    . LAPAROSCOPY    . PILONIDAL CYST / SINUS EXCISION    . TONSILLECTOMY      reports that he has quit smoking.  His smoking use included cigarettes. He has never used smokeless tobacco. He reports that he drinks alcohol. He reports that he does not use drugs. family history includes Kidney disease in his father. No Known Allergies    Review of Systems  Constitutional: Negative for appetite change and unexpected weight change.  HENT: Positive for hearing loss.   Respiratory: Negative for shortness of breath.   Cardiovascular: Negative for chest pain.  Psychiatric/Behavioral: Positive for sleep disturbance. Negative for dysphoric mood.       Objective:   Physical Exam  Constitutional: He appears well-developed and well-nourished.  Cardiovascular: Normal rate and regular rhythm.  Pulmonary/Chest: Effort normal and breath sounds normal.  Musculoskeletal: He exhibits no edema.  Neurological: He is alert.       Assessment:     #1 chronic insomnia.  Not much improved with trazodone.  Exacerbated by daytime napping  #2 dementia.  Probable Alzheimer's type.  Currently on low-dose Aricept  #3 hypertension-stable    Plan:     -Handout on sleep hygiene given.  Stressed importance of avoiding daytime napping -We discussed risks of things like benzodiazepines and Ambien and would definitely recommend avoiding those. -Try to stay engaged in much as possible during daytime hours. -Increase Aricept to 10 mg daily -Reassess in 3 months -We reiterated the importance of NO DRIVING- even locally.  We feel his risk would be very high (decreased cognitive function, decreased hearing, slow  reaction times, etc.)  [name] MD [facility] at [facility]

## 2017-10-20 ENCOUNTER — Other Ambulatory Visit: Payer: Self-pay | Admitting: Family Medicine

## 2017-10-31 ENCOUNTER — Other Ambulatory Visit: Payer: Self-pay | Admitting: Family Medicine

## 2017-11-14 DIAGNOSIS — H903 Sensorineural hearing loss, bilateral: Secondary | ICD-10-CM | POA: Diagnosis not present

## 2017-11-14 DIAGNOSIS — H6123 Impacted cerumen, bilateral: Secondary | ICD-10-CM | POA: Diagnosis not present

## 2018-01-09 ENCOUNTER — Other Ambulatory Visit: Payer: Self-pay | Admitting: Family Medicine

## 2018-01-30 ENCOUNTER — Other Ambulatory Visit: Payer: Self-pay | Admitting: Family Medicine

## 2018-02-07 ENCOUNTER — Encounter: Payer: Self-pay | Admitting: Family Medicine

## 2018-02-07 ENCOUNTER — Ambulatory Visit (INDEPENDENT_AMBULATORY_CARE_PROVIDER_SITE_OTHER): Payer: Medicare Other | Admitting: Family Medicine

## 2018-02-07 ENCOUNTER — Other Ambulatory Visit: Payer: Self-pay

## 2018-02-07 VITALS — BP 132/80 | HR 65 | Temp 97.9°F | Ht 68.0 in | Wt 179.3 lb

## 2018-02-07 DIAGNOSIS — F0390 Unspecified dementia without behavioral disturbance: Secondary | ICD-10-CM

## 2018-02-07 DIAGNOSIS — Z9181 History of falling: Secondary | ICD-10-CM | POA: Diagnosis not present

## 2018-02-07 DIAGNOSIS — F5104 Psychophysiologic insomnia: Secondary | ICD-10-CM

## 2018-02-07 DIAGNOSIS — I10 Essential (primary) hypertension: Secondary | ICD-10-CM | POA: Diagnosis not present

## 2018-02-07 DIAGNOSIS — F03A0 Unspecified dementia, mild, without behavioral disturbance, psychotic disturbance, mood disturbance, and anxiety: Secondary | ICD-10-CM

## 2018-02-07 MED ORDER — MIRTAZAPINE 15 MG PO TABS: 15.0000 mg | ORAL_TABLET | Freq: Every day | ORAL | 5 refills | Status: DC

## 2018-02-07 NOTE — Progress Notes (Signed)
Subjective:     Patient ID: David Howard, male   DOB: [birth year], 85 y.o.   MRN: [id number]  HPI Patient is here accompanied by wife.  His chronic problems include history of some early dementia, BPH, hypertension, history of falls, GERD, and chronic insomnia.  He is very hard of hearing which makes communication difficult.  Major complaint is difficulty sleeping.  He apparently takes several daytime naps.  He has tried multiple things including melatonin without relief.  He is currently on trazodone 100 mg at night not working well.  Wife states that he goes to bed usually around 9 or 10 and usually goes to sleep immediately but then after couple hours is usually awake.  He is frequently awakened at 2 AM and cannot get back to sleep.  Usually has nocturia x2.  He does have BPH but fairly well controlled with finasteride.  Past history of falls and would be very high risk with benzodiazepines.  He has had general decline in appetite and wife states he is not eating very much at meals.  He has lost about 5 pounds since last visit.  He has hypertension treated with losartan.  No recent dizziness.  Blood pressure stable.  No chest pain.  Past Medical History:  Diagnosis Date  . Benign neoplasm of colon   . Diverticulosis of colon (without mention of hemorrhage)   . Elevated prostate specific antigen (PSA)    BPH, follows with uro  . Esophageal reflux   . Hearing loss    L>R, follows with ENT, wears hearing aides  . Inguinal hernia without mention of obstruction or gangrene, unilateral or unspecified, (not specified as recurrent)   . Internal hemorrhoids without mention of complication   . Malignant neoplasm of bladder, part unspecified   . Other abnormal glucose   . Peripheral vascular disease, unspecified ([place])   . Personal history of other lymphatic and hematopoietic neoplasm 2003   Primary cutaneous B-cell lymphoma, s/p xrt  . Spinal stenosis, unspecified region other than cervical    R>L LE pain  . Unspecified essential hypertension    Past Surgical History:  Procedure Laterality Date  . CATARACT EXTRACTION    . LAPAROSCOPY    . PILONIDAL CYST / SINUS EXCISION    . TONSILLECTOMY      reports that he has quit smoking. His smoking use included cigarettes. He has never used smokeless tobacco. He reports current alcohol use. He reports that he does not use drugs. family history includes Kidney disease in his father. No Known Allergies   Review of Systems  Constitutional: Negative for fatigue and fever.  Eyes: Negative for visual disturbance.  Respiratory: Negative for cough, chest tightness and shortness of breath.   Cardiovascular: Negative for chest pain, palpitations and leg swelling.  Gastrointestinal: Negative for abdominal pain.  Genitourinary: Negative for difficulty urinating and dysuria.  Neurological: Negative for dizziness, syncope, weakness, light-headedness and headaches.       Objective:   Physical Exam Constitutional:      Appearance: He is well-developed.  HENT:     Right Ear: External ear normal.     Left Ear: External ear normal.  Eyes:     Pupils: Pupils are equal, round, and reactive to light.  Neck:     Musculoskeletal: Neck supple.     Thyroid: No thyromegaly.  Cardiovascular:     Rate and Rhythm: Normal rate and regular rhythm.  Pulmonary:     Effort: Pulmonary effort is normal. No respiratory  distress.     Breath sounds: Normal breath sounds. No wheezing or rales.  Musculoskeletal:     Right lower leg: No edema.     Left lower leg: No edema.  Neurological:     Mental Status: He is alert and oriented to person, place, and time.     Cranial Nerves: No cranial nerve deficit.        Assessment:     #1 cognitive impairment with probable early dementia.  No major behavioral disturbance. On Aricept.    #2 chronic insomnia-not improved with trazodone  #3 high risk for falls  #4 hypertension stable and at goal  #5 BPH  symptomatically stable    Plan:     -Discussed possible change in medication.  We recommend he hold trazodone and try mirtazapine 15 mg at night.  Hopefully this can help his appetite and sleep.  It looks like he may have been on a trial of low-dose mirtazapine in the past but they could not recall his response. -Discussed sleep hygiene.  Was specifically recommended try to avoid daytime napping as much as possible  [name] MD [facility] at [facility]

## 2018-02-07 NOTE — Patient Instructions (Signed)
HOLD the Trazodone  Start the Remeron 15 mg one at night- take about 30 minutes prior to bedtime.

## 2018-03-24 IMAGING — CT CT HEAD W/O CM
4 of 7 series · 17 of 47 positions shown, 18 images · non-contrast
Comparison: 10/31/2014

CLINICAL DATA: Generalized weakness. Fell in bathroom striking
head. Initial encounter.

EXAM:
CT HEAD WITHOUT CONTRAST
CT CERVICAL SPINE WITHOUT CONTRAST
TECHNIQUE: Multidetector CT imaging of the head and cervical spine was
performed following the standard protocol without intravenous
contrast. Multiplanar CT image reconstructions of the cervical spine
were also generated.

[Series 3: head w/o · axial · non-contrast · 0.43mm/px · z∈[-106,-16]mm · 4 of 30 slices shown, 5 images]
[im 6/30  brain]
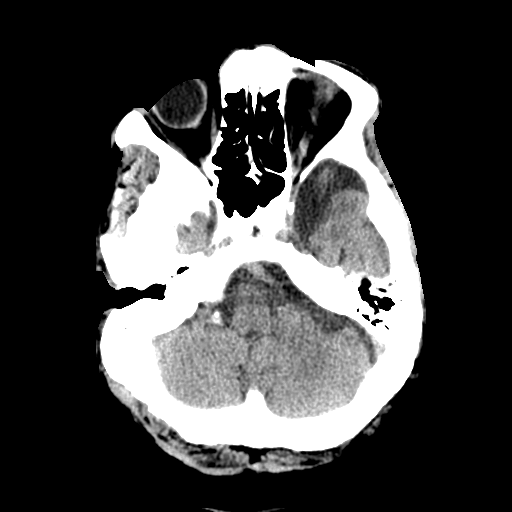
[im 6/30  bone]
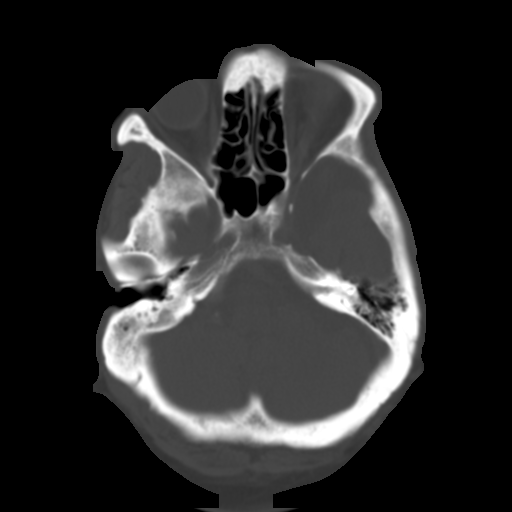
[im 12/30  brain]
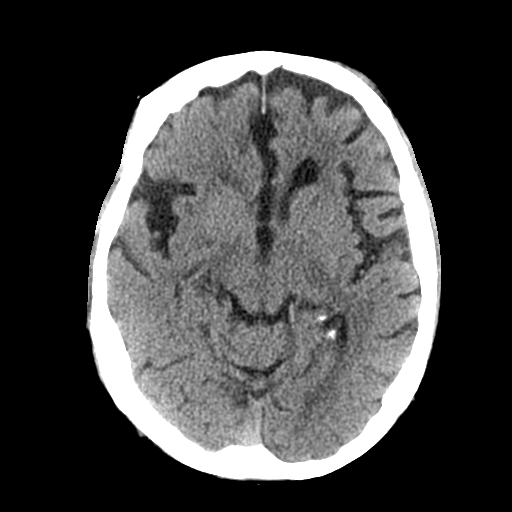
[im 18/30  brain]
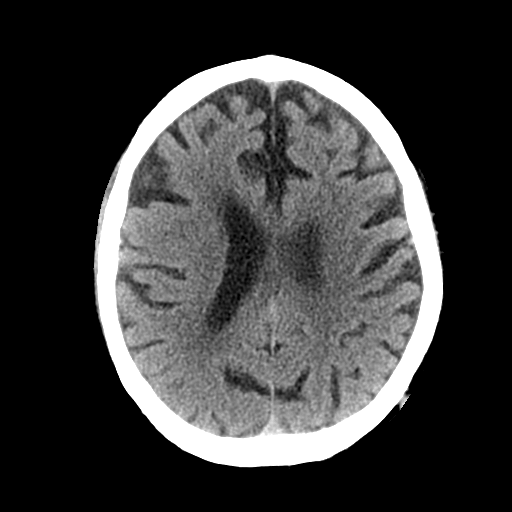
[im 24/30  brain]
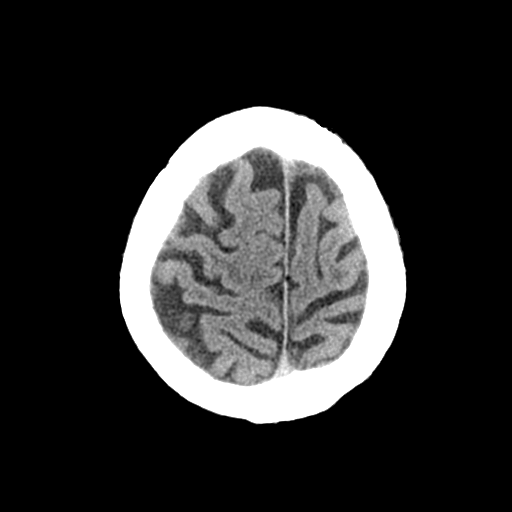

[Series 4: bone windows · axial · 0.43mm/px · z∈[-116,-6]mm · 8 of 49 slices shown]
[im 6/49  bone]
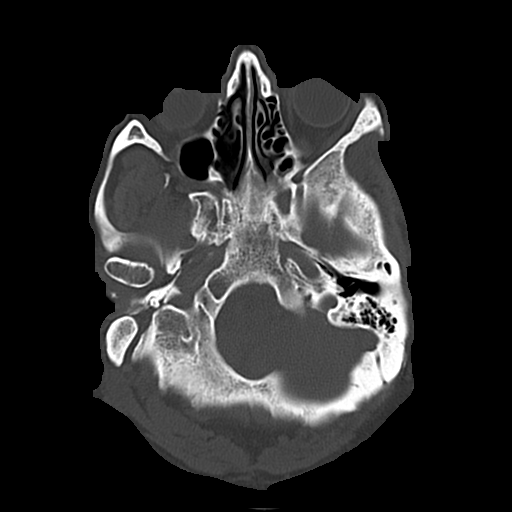
[im 11/49  bone]
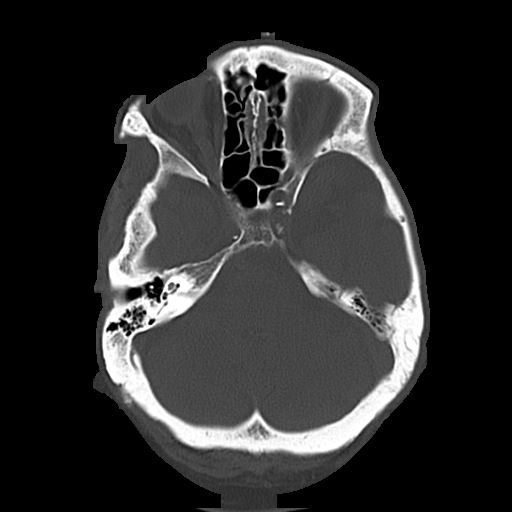
[im 17/49  bone]
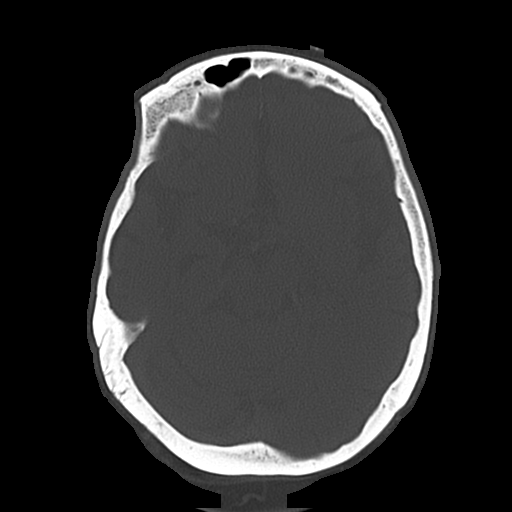
[im 22/49  bone]
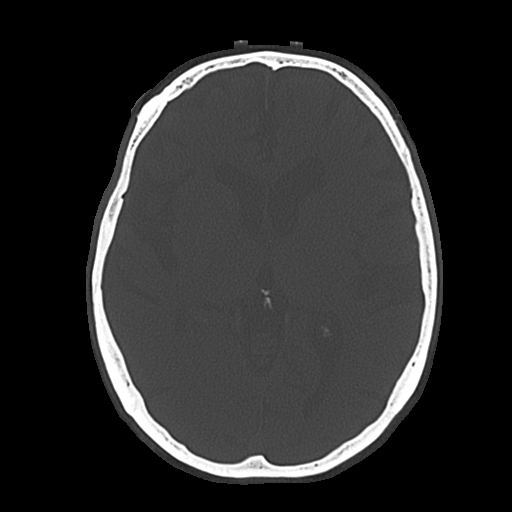
[im 27/49  bone]
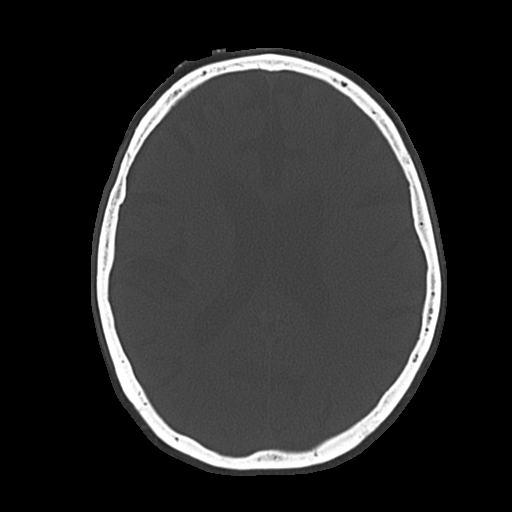
[im 33/49  bone]
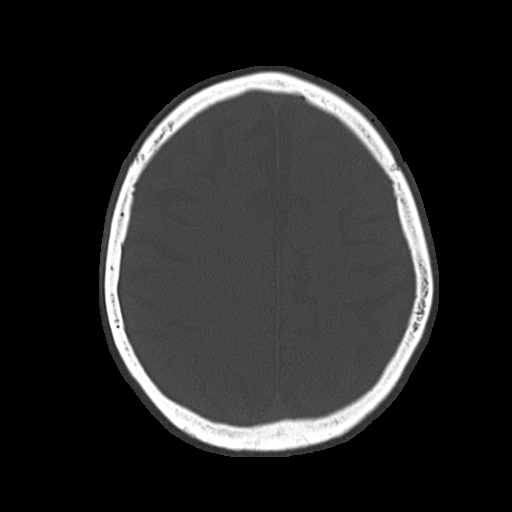
[im 38/49  bone]
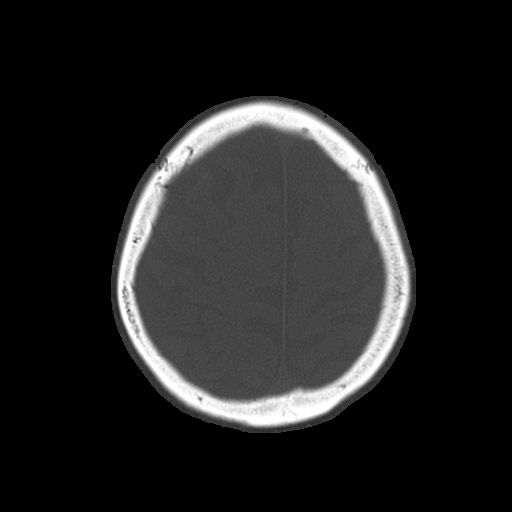
[im 43/49  bone]
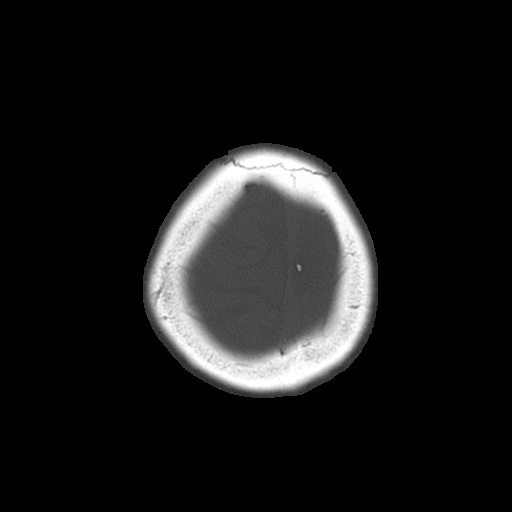

[Series 10: sagittal · sagittal · 0.32mm/px · 2 of 52 slices shown]
[im 18/52  brain]
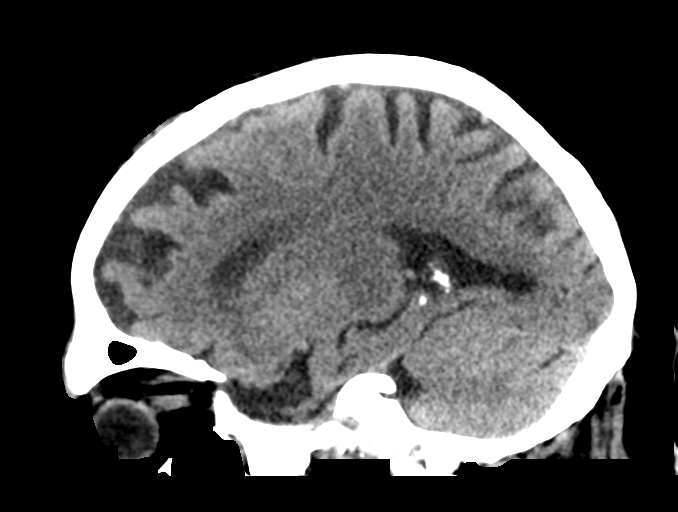
[im 35/52  brain]
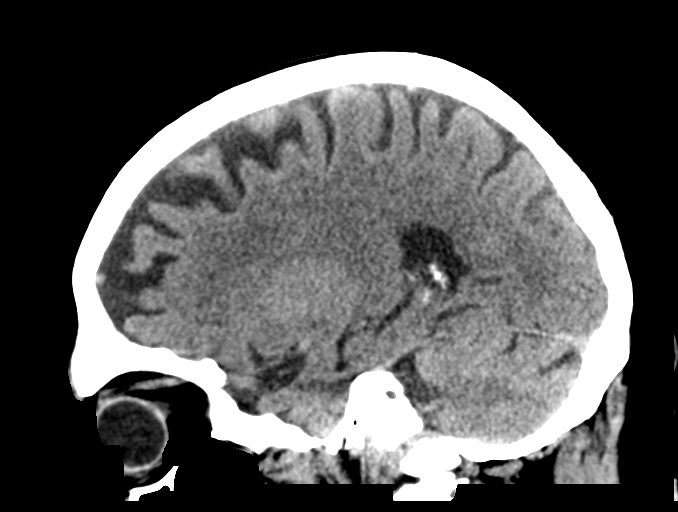

[Series 11: axial recon · coronal · 0.23mm/px · 3 of 100 slices shown]
[im 20/100  brain]
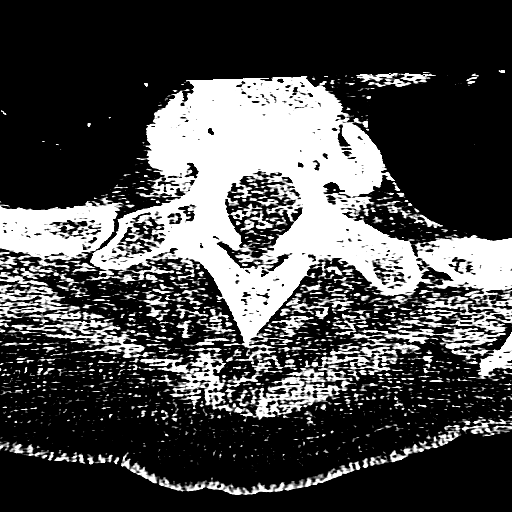
[im 40/100  brain]
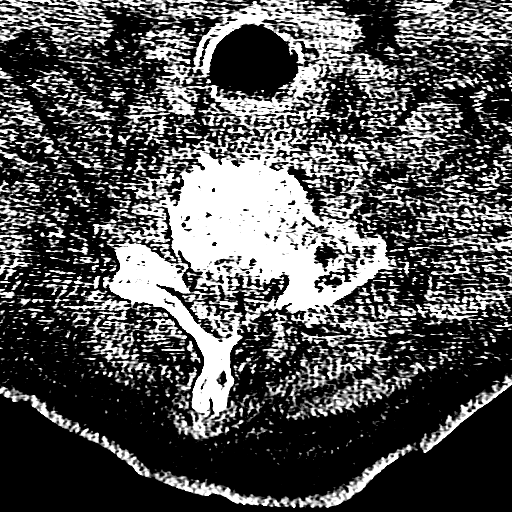
[im 60/100  brain]
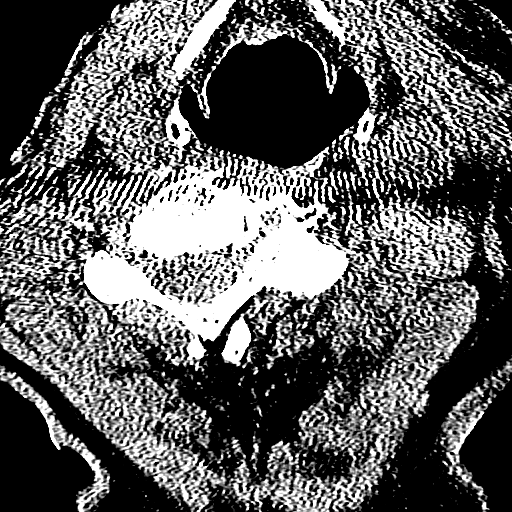

[17 of 47 positions shown; findings below may reference images not displayed]

FINDINGS: CT HEAD FINDINGS

Brain: There is no evidence of acute cortical infarct, intracranial
hemorrhage, mass, midline shift, or extra-axial fluid collection.
Cerebral atrophy is unchanged and likely within normal limits for
age. Periventricular white matter hypodensities are unchanged and
nonspecific but compatible with minimal chronic small vessel
ischemic disease.

Vascular: Calcified atherosclerosis at the skullbase. No hyperdense
vessel.

Skull: No fracture or focal osseous lesion.

Sinuses/Orbits: Mild left greater than right sphenoid sinus mucosal
thickening. Bilateral cataract extraction.

Other: Left frontal scalp laceration with mild swelling.

CT CERVICAL SPINE FINDINGS

Alignment: Reversal of the normal cervical lordosis. Unchanged grade
1 anterolisthesis of C4 on C5. Right convex cervical spine
curvature.

Skull base and vertebrae: A chronic nonunited type 2 dens fracture
is unchanged, with the odontoid fragment again being displaced
approximately 3 mm posterior relative to the C2 vertebral body. No
acute fracture is identified.

Soft tissues and spinal canal: No prevertebral fluid or swelling. No
visible canal hematoma.

Disc levels: Similar appearance of advanced disc degeneration
throughout the cervical spine and in the upper thoracic spine with
exception of C2-3. Advanced multilevel cervical facet arthrosis is
greater on the left. There is multilevel facet ankylosis.

Upper chest: Grossly clear lung apices.

Other: Asymmetric left carotid artery calcified atherosclerosis.
IMPRESSION: 1. No evidence of acute intracranial abnormality.
2. No acute cervical spine fracture identified. Unchanged chronic
nonunited type 2 dens fracture.

## 2018-04-07 ENCOUNTER — Other Ambulatory Visit: Payer: Self-pay | Admitting: Family Medicine

## 2018-05-03 ENCOUNTER — Other Ambulatory Visit: Payer: Self-pay | Admitting: Family Medicine

## 2018-07-03 ENCOUNTER — Other Ambulatory Visit: Payer: Self-pay | Admitting: Family Medicine

## 2018-07-03 NOTE — Telephone Encounter (Signed)
Pt has 4 pills left, they have been trying since thurs to get refilled.  [facility]

## 2018-07-10 ENCOUNTER — Telehealth: Payer: Self-pay

## 2018-07-10 ENCOUNTER — Other Ambulatory Visit: Payer: Self-pay

## 2018-07-10 ENCOUNTER — Other Ambulatory Visit: Payer: Self-pay | Admitting: Family Medicine

## 2018-07-10 ENCOUNTER — Ambulatory Visit (INDEPENDENT_AMBULATORY_CARE_PROVIDER_SITE_OTHER): Payer: Medicare Other | Admitting: Family Medicine

## 2018-07-10 DIAGNOSIS — F5104 Psychophysiologic insomnia: Secondary | ICD-10-CM

## 2018-07-10 DIAGNOSIS — F0390 Unspecified dementia without behavioral disturbance: Secondary | ICD-10-CM | POA: Diagnosis not present

## 2018-07-10 DIAGNOSIS — F03A0 Unspecified dementia, mild, without behavioral disturbance, psychotic disturbance, mood disturbance, and anxiety: Secondary | ICD-10-CM

## 2018-07-10 NOTE — Telephone Encounter (Signed)
We cannot increase his Aricept.  There is not great evidence that nutritional supplements help- unfortunately.  Would be happy to set up call if he would like if he is on DPR.

## 2018-07-10 NOTE — Telephone Encounter (Signed)
Copied from [place] [phone]. Topic: General - Other >> Jul 10, 2018  8:35 AM [name] A wrote: Reason for CRM: Pts son, [name], called with some concerns regarding pts memory. Pts son is requesting to know if the medication pt is currently taking for memory can be increased, if there are supplements for memory, and has questions regarding setting up an assessment for an assisted living facility. Please advise.

## 2018-07-10 NOTE — Telephone Encounter (Signed)
Please see message. °

## 2018-07-10 NOTE — Progress Notes (Signed)
Patient ID: David Howard, male   DOB: [birth year], 85 y.o.   MRN: [id number]  This visit type was conducted due to national recommendations for restrictions regarding the COVID-19 pandemic in an effort to limit this patient's exposure and mitigate transmission in our community.   Virtual Visit via Video Note  I connected regarding [name] on 07/10/18 at  4:30 PM EDT by a video enabled telemedicine application and verified that I am speaking with the correct person using two identifiers.  Location patient: home Location provider:work or home office Persons participating in the virtual visit: patient;s son [name]), provider  I discussed the limitations of evaluation and management by telemedicine and the availability of in person appointments. The patient expressed understanding and agreed to proceed.   HPI:  Consultation visit with patient's son [name] who is currently here visiting from [place].  He is looking at moving his mother and father up to [place] to a assisted living facility which would be about 5 minutes from his house.  He has had prior discussions of this with his parents and they apparently are on board.  He will have some papers that need to be completed regarding that.  We discussed his father's cognitive impairments predominantly.  He had assessment last year with neuropsychologist and was diagnosed with mild dementia without behavioral disturbance.  He has been on Aricept 10 mg daily.  Son has not seen much change in his status over the past 3 months but has had some definite cognitive impairments.  He does still manage most of his daily activities such as bathing, dressing, and medications.  He is no longer driving.  Son had some questions regarding nutritional type supplements and we explained that to our knowledge there is no good evidence for those.  He also had some questions about Aricept and we explained that 10 mg was the target dose but we did have option of  adding medication such as memantine  Long history of insomnia.  Son states that he has been sleeping sometimes up to several hours per day when they have been visiting.  ROS: See pertinent positives and negatives per HPI.  Past Medical History:  Diagnosis Date  . Benign neoplasm of colon   . Diverticulosis of colon (without mention of hemorrhage)   . Elevated prostate specific antigen (PSA)    BPH, follows with uro  . Esophageal reflux   . Hearing loss    L>R, follows with ENT, wears hearing aides  . Inguinal hernia without mention of obstruction or gangrene, unilateral or unspecified, (not specified as recurrent)   . Internal hemorrhoids without mention of complication   . Malignant neoplasm of bladder, part unspecified   . Other abnormal glucose   . Peripheral vascular disease, unspecified ([place])   . Personal history of other lymphatic and hematopoietic neoplasm 2003   Primary cutaneous B-cell lymphoma, s/p xrt  . Spinal stenosis, unspecified region other than cervical    R>L LE pain  . Unspecified essential hypertension     Past Surgical History:  Procedure Laterality Date  . CATARACT EXTRACTION    . LAPAROSCOPY    . PILONIDAL CYST / SINUS EXCISION    . TONSILLECTOMY      Family History  Problem Relation Age of Onset  . Kidney disease Father     SOCIAL HX: Retired.  Lives with his wife   Current Outpatient Medications:  .  Cholecalciferol (VITAMIN D3) 1000 UNITS CAPS, Take 1 capsule by mouth daily.,  Disp: , Rfl:  .  donepezil (ARICEPT) 10 MG tablet, Take 1 tablet (10 mg total) by mouth at bedtime., Disp: 90 tablet, Rfl: 3 .  finasteride (PROSCAR) 5 MG tablet, TAKE 1 TABLET ONCE DAILY., Disp: 90 tablet, Rfl: 0 .  losartan (COZAAR) 100 MG tablet, TAKE 1 TABLET ONCE DAILY., Disp: 90 tablet, Rfl: 0 .  mirtazapine (REMERON) 15 MG tablet, TAKE ONE TABLET AT BEDTIME., Disp: 90 tablet, Rfl: 0 .  Multiple Vitamin (ESSENTIAL ONE DAILY PO), Take by mouth., Disp: , Rfl:  .   Multiple Vitamin (MULTIVITAMIN) capsule, Take 1 capsule by mouth daily.  , Disp: , Rfl:  .  Omega-3 Fatty Acids (FISH OIL) 1000 MG CAPS, Take 1 capsule by mouth daily. , Disp: , Rfl:  .  psyllium (HYDROCIL/METAMUCIL) 95 % PACK, Take 1 packet by mouth daily., Disp: , Rfl:  .  vitamin B-12 (CYANOCOBALAMIN) 100 MCG tablet, Take 100 mcg by mouth daily., Disp: , Rfl:   EXAM:  VITALS per patient if applicable:  GENERAL: alert, oriented, appears well and in no acute distress  HEENT: atraumatic, conjunttiva clear, no obvious abnormalities on inspection of external nose and ears  NECK: normal movements of the head and neck  LUNGS: on inspection no signs of respiratory distress, breathing rate appears normal, no obvious gross SOB, gasping or wheezing  CV: no obvious cyanosis  MS: moves all visible extremities without noticeable abnormality  PSYCH/NEURO: pleasant and cooperative, no obvious depression or anxiety, speech and thought processing grossly intact  ASSESSMENT AND PLAN:  Discussed the following assessment and plan:  #1 mild cognitive impairment.  Patient already takes Aricept.  Will discuss potential addition of memantine at some point.  Son had questions regarding some supplements and we explained that we do not think there is any good evidence for nutritional type supplements  #2 chronic insomnia.  This has been challenging to manage.  He remains on Remeron.  He is taken melatonin in the past.  Family states he is doing a lot of daytime napping which is certainly exacerbating situation We have encouraged family to try to keep him engaged as much as possible during the day    I discussed the assessment and treatment plan with the patient. The patient was provided an opportunity to ask questions and all were answered. The patient agreed with the plan and demonstrated an understanding of the instructions.   The patient was advised to call back or seek an in-person evaluation if the  symptoms worsen or if the condition fails to improve as anticipated.   Carolann Littler, MD

## 2018-07-10 NOTE — Telephone Encounter (Signed)
Son has a [facility] appointment today at 4:30pm

## 2018-07-18 ENCOUNTER — Other Ambulatory Visit: Payer: Self-pay | Admitting: Family Medicine

## 2018-08-09 ENCOUNTER — Ambulatory Visit (INDEPENDENT_AMBULATORY_CARE_PROVIDER_SITE_OTHER): Payer: Medicare Other | Admitting: Family Medicine

## 2018-08-09 ENCOUNTER — Other Ambulatory Visit: Payer: Self-pay

## 2018-08-09 ENCOUNTER — Encounter: Payer: Self-pay | Admitting: Family Medicine

## 2018-08-09 VITALS — BP 148/84 | HR 68 | Temp 98.0°F | Ht 63.0 in | Wt 182.6 lb

## 2018-08-09 DIAGNOSIS — I10 Essential (primary) hypertension: Secondary | ICD-10-CM

## 2018-08-09 DIAGNOSIS — F5104 Psychophysiologic insomnia: Secondary | ICD-10-CM

## 2018-08-09 DIAGNOSIS — F0390 Unspecified dementia without behavioral disturbance: Secondary | ICD-10-CM

## 2018-08-09 DIAGNOSIS — F03A0 Unspecified dementia, mild, without behavioral disturbance, psychotic disturbance, mood disturbance, and anxiety: Secondary | ICD-10-CM

## 2018-08-09 DIAGNOSIS — R5383 Other fatigue: Secondary | ICD-10-CM

## 2018-08-09 LAB — BASIC METABOLIC PANEL
BUN: 22 mg/dL (ref 6–23)
CO2: 27 mEq/L (ref 19–32)
Calcium: 9.3 mg/dL (ref 8.4–10.5)
Chloride: 98 mEq/L (ref 96–112)
Creatinine, Ser: 1.11 mg/dL (ref 0.40–1.50)
GFR: 61.82 mL/min (ref >60.00)
Glucose, Bld: 86 mg/dL (ref 70–99)
Potassium: 4.1 mEq/L (ref 3.5–5.1)
Sodium: 134 mEq/L — ABNORMAL LOW (ref 135–145)

## 2018-08-09 LAB — CBC WITH DIFFERENTIAL/PLATELET
Basophils Absolute: 0.1 10*3/uL (ref 0.0–0.1)
Basophils Relative: 0.6 % (ref 0.0–3.0)
Eosinophils Absolute: 0.2 10*3/uL (ref 0.0–0.7)
Eosinophils Relative: 3 % (ref 0.0–5.0)
HCT: 40.3 % (ref 39.0–52.0)
Hemoglobin: 13.7 g/dL (ref 13.0–17.0)
Lymphocytes Relative: 22.2 % (ref 12.0–46.0)
Lymphs Abs: 1.7 10*3/uL (ref 0.7–4.0)
MCHC: 34 g/dL (ref 30.0–36.0)
MCV: 88.5 fl (ref 78.0–100.0)
Monocytes Absolute: 0.7 10*3/uL (ref 0.1–1.0)
Monocytes Relative: 8.8 % (ref 3.0–12.0)
Neutro Abs: 5.1 10*3/uL (ref 1.4–7.7)
Neutrophils Relative %: 65.4 % (ref 43.0–77.0)
Platelets: 148 10*3/uL — ABNORMAL LOW (ref 150.0–400.0)
RBC: 4.55 Mil/uL (ref 4.22–5.81)
RDW: 13.1 % (ref 11.5–15.5)
WBC: 7.8 10*3/uL (ref 4.0–10.5)

## 2018-08-09 LAB — TSH: TSH: 1.78 u[IU]/mL (ref 0.35–4.50)

## 2018-08-09 NOTE — Progress Notes (Signed)
Subjective:     Patient ID: David Howard, male   DOB: 1923/07/08, 85 y.o.   MRN: [id number]  HPI  Patient is here for medical follow-up.  He and his wife are going to be moving to [place] to be near family and this move will occur within the next few weeks.  Patient is very hard of hearing and also has some cognitive impairments which makes communication difficult.  We communicated predominantly with his wife because of his hearing deficits.  He has history of hypertension, GERD, mild dementia, history of bladder cancer, BPH, spinal stenosis, remote history of non-Hodgkin's lymphoma, and chronic insomnia  No recent falls.  Continues to struggle some with insomnia though his wife states that he takes several small naps during the day and seems to doze off at night very quickly.  She is not sure how uninterrupted his sleep is at night.  He does take mirtazapine but not sure if this is helping much  Hypertension has been controlled with losartan.  Up slightly today in office but generally this is been better controlled at home  Past Medical History:  Diagnosis Date  . Benign neoplasm of colon   . Diverticulosis of colon (without mention of hemorrhage)   . Elevated prostate specific antigen (PSA)    BPH, follows with uro  . Esophageal reflux   . Hearing loss    L>R, follows with ENT, wears hearing aides  . Inguinal hernia without mention of obstruction or gangrene, unilateral or unspecified, (not specified as recurrent)   . Internal hemorrhoids without mention of complication   . Malignant neoplasm of bladder, part unspecified   . Other abnormal glucose   . Peripheral vascular disease, unspecified ([place])   . Personal history of other lymphatic and hematopoietic neoplasm 2003   Primary cutaneous B-cell lymphoma, s/p xrt  . Spinal stenosis, unspecified region other than cervical    R>L LE pain  . Unspecified essential hypertension    Past Surgical History:  Procedure Laterality  Date  . CATARACT EXTRACTION    . LAPAROSCOPY    . PILONIDAL CYST / SINUS EXCISION    . TONSILLECTOMY      reports that he has quit smoking. His smoking use included cigarettes. He has never used smokeless tobacco. He reports current alcohol use. He reports that he does not use drugs. family history includes Kidney disease in his father. No Known Allergies    Review of Systems Review of systems obtained from wife.  Patient is very hard of hearing.  No recent fever, cough, dyspnea, chest pain, abdominal pain, or any change in urine or stool habits.  His weight is actually up 3 pounds from last visit.  No peripheral edema issues.    Objective:   Physical Exam Constitutional:      Appearance: Normal appearance.  Cardiovascular:     Rate and Rhythm: Normal rate and regular rhythm.  Pulmonary:     Effort: Pulmonary effort is normal.     Breath sounds: Normal breath sounds.  Abdominal:     Tenderness: There is no abdominal tenderness.  Musculoskeletal:     Right lower leg: No edema.     Left lower leg: No edema.  Neurological:     General: No focal deficit present.     Mental Status: He is alert.     Cranial Nerves: No cranial nerve deficit.        Assessment:     #1 hypertension.  Slightly high reading today  here in office but improved at home  #2 mild dementia.  This was assessed by neuropsychologist last year and he remains on Aricept 10 mg daily  #3 chronic insomnia-overall stable.  Patient remains on mirtazapine and has taken melatonin in the past with minimal success.  Avoiding benzodiazepines because of his high risk of falls  #4 history of BPH  #5 generalized fatigue.  Suspect largely related to his inactivity.  #6 past history of bladder cancer  #7 remote history of non-Hodgkin's lymphoma     Plan:     -Monitor blood pressure at home and be in touch if consistently greater than 140/90 -Check labs with basic metabolic panel, CBC, TSH -Continue current  medications -There will be reestablishing with primary care once again to [place].  Records release was signed today  [name] MD [facility] at [facility]

## 2018-08-09 NOTE — Patient Instructions (Signed)
Monitor blood pressure and be in touch if consistently > 150/90 

## 2018-08-28 ENCOUNTER — Telehealth: Payer: Self-pay | Admitting: Family Medicine

## 2018-08-28 NOTE — Telephone Encounter (Signed)
Copied from [place] [phone] [phone]. Topic: General - Inquiry >> Aug 28, 2018  9:24 AM Berneta Levins wrote: Reason for CRM:   Pt's son, [name] calling.  States that he faxed over 11 pages of the [place] that is to be filled out by current PCP.  [name] wants to make sure this was received and find out where it stands in the status of being completed.  [name] can be reached at [phone].

## 2018-08-29 ENCOUNTER — Encounter: Payer: Self-pay | Admitting: Family Medicine

## 2018-08-29 NOTE — Telephone Encounter (Signed)
I called the son [name] and he stated that he sent the fax over to [place] and confirmed the fax number on 08/16/18 at 4:24pm. I let him know that I did not receive the fax for either of his parents. He stated that he also called the [facility] and gave a very detailed message about the faxes but I do not see a message from that date. I gave son the MyChart sign up and he will try to send that way.

## 2018-09-11 DIAGNOSIS — K5900 Constipation, unspecified: Secondary | ICD-10-CM | POA: Diagnosis not present

## 2018-09-11 DIAGNOSIS — I10 Essential (primary) hypertension: Secondary | ICD-10-CM | POA: Diagnosis not present

## 2018-09-11 DIAGNOSIS — R269 Unspecified abnormalities of gait and mobility: Secondary | ICD-10-CM | POA: Diagnosis not present

## 2018-09-11 DIAGNOSIS — G309 Alzheimer's disease, unspecified: Secondary | ICD-10-CM | POA: Diagnosis not present

## 2018-11-03 DIAGNOSIS — H52213 Irregular astigmatism, bilateral: Secondary | ICD-10-CM | POA: Diagnosis not present

## 2018-11-03 DIAGNOSIS — H10503 Unspecified blepharoconjunctivitis, bilateral: Secondary | ICD-10-CM | POA: Diagnosis not present

## 2018-11-03 DIAGNOSIS — Z961 Presence of intraocular lens: Secondary | ICD-10-CM | POA: Diagnosis not present

## 2018-11-03 DIAGNOSIS — H04123 Dry eye syndrome of bilateral lacrimal glands: Secondary | ICD-10-CM | POA: Diagnosis not present

## 2018-11-03 DIAGNOSIS — H5213 Myopia, bilateral: Secondary | ICD-10-CM | POA: Diagnosis not present

## 2021-04-18 DEATH — deceased
